# Patient Record
Sex: Female | Born: 2000 | Race: White | Hispanic: No | Marital: Single | Smoking: Current some day smoker
Health system: Southern US, Community
[De-identification: ages and names within clinical notes are randomized; demographics above are authoritative.]

## PROBLEM LIST (undated history)

## (undated) DIAGNOSIS — F1910 Other psychoactive substance abuse, uncomplicated: Secondary | ICD-10-CM

## (undated) DIAGNOSIS — F17210 Nicotine dependence, cigarettes, uncomplicated: Secondary | ICD-10-CM

## (undated) DIAGNOSIS — M5382 Other specified dorsopathies, cervical region: Secondary | ICD-10-CM

## (undated) DIAGNOSIS — S82142A Displaced bicondylar fracture of left tibia, initial encounter for closed fracture: Secondary | ICD-10-CM

## (undated) DIAGNOSIS — F909 Attention-deficit hyperactivity disorder, unspecified type: Secondary | ICD-10-CM

## (undated) DIAGNOSIS — F1990 Other psychoactive substance use, unspecified, uncomplicated: Secondary | ICD-10-CM

## (undated) HISTORY — DX: Other psychoactive substance use, unspecified, uncomplicated: F19.90

## (undated) HISTORY — DX: Displaced bicondylar fracture of left tibia, initial encounter for closed fracture: S82.142A

## (undated) HISTORY — PX: OTHER SURGICAL HISTORY: SHX169

---
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
## 2018-01-06 ENCOUNTER — Ambulatory Visit (INDEPENDENT_AMBULATORY_CARE_PROVIDER_SITE_OTHER): Payer: Medicaid Other | Admitting: Podiatry

## 2018-01-06 ENCOUNTER — Ambulatory Visit (INDEPENDENT_AMBULATORY_CARE_PROVIDER_SITE_OTHER): Payer: Medicaid Other

## 2018-01-06 ENCOUNTER — Encounter: Payer: Self-pay | Admitting: Podiatry

## 2018-01-06 VITALS — BP 136/82 | HR 54 | Ht 65.0 in | Wt 125.0 lb

## 2018-01-06 DIAGNOSIS — M21621 Bunionette of right foot: Secondary | ICD-10-CM

## 2018-01-06 DIAGNOSIS — M205X1 Other deformities of toe(s) (acquired), right foot: Secondary | ICD-10-CM

## 2018-01-06 DIAGNOSIS — M79671 Pain in right foot: Secondary | ICD-10-CM

## 2018-01-06 NOTE — Patient Instructions (Signed)
Pre-Operative Instructions  Congratulations, you have decided to take an important step towards improving your quality of life.  You can be assured that the doctors and staff at [facility] will be with you every step of the way.  Here are some important things you should know:  1. Plan to be at the surgery center/hospital at least 1 (one) hour prior to your scheduled time, unless otherwise directed by the surgical center/hospital staff.  You must have a responsible adult accompany you, remain during the surgery and drive you home.  Make sure you have directions to the surgical center/hospital to ensure you arrive on time. 2. If you are having surgery at [facility] or [facility], you will need a copy of your medical history and physical form from your family physician within one month prior to the date of surgery. We will give you a form for your primary physician to complete.  3. We make every effort to accommodate the date you request for surgery.  However, there are times where surgery dates or times have to be moved.  We will contact you as soon as possible if a change in schedule is required.   4. No aspirin/ibuprofen for one week before surgery.  If you are on aspirin, any non-steroidal anti-inflammatory medications (Mobic, Aleve, Ibuprofen) should not be taken seven (7) days prior to your surgery.  You make take Tylenol for pain prior to surgery.  5. Medications - If you are taking daily heart and blood pressure medications, seizure, reflux, allergy, asthma, anxiety, pain or diabetes medications, make sure you notify the surgery center/hospital before the day of surgery so they can tell you which medications you should take or avoid the day of surgery. 6. No food or drink after midnight the night before surgery unless directed otherwise by surgical center/hospital staff. 7. No alcoholic beverages 24-hours prior to surgery.  No smoking 24-hours prior or 24-hours after  surgery. 8. Wear loose pants or shorts. They should be loose enough to fit over bandages, boots, and casts. 9. Don't wear slip-on shoes. Sneakers are preferred. 10. Bring your boot with you to the surgery center/hospital.  Also bring crutches or a walker if your physician has prescribed it for you.  If you do not have this equipment, it will be provided for you after surgery. 11. If you have not been contacted by the surgery center/hospital by the day before your surgery, call to confirm the date and time of your surgery. 12. Leave-time from work may vary depending on the type of surgery you have.  Appropriate arrangements should be made prior to surgery with your employer. 13. Prescriptions will be provided immediately following surgery by your doctor.  Fill these as soon as possible after surgery and take the medication as directed. Pain medications will not be refilled on weekends and must be approved by the doctor. 14. Remove nail polish on the operative foot and avoid getting pedicures prior to surgery. 15. Wash the night before surgery.  The night before surgery wash the foot and leg well with water and the antibacterial soap provided. Be sure to pay special attention to beneath the toenails and in between the toes.  Wash for at least three (3) minutes. Rinse thoroughly with water and dry well with a towel.  Perform this wash unless told not to do so by your physician.  Enclosed: 1 Ice pack (please put in freezer the night before surgery)   1 Hibiclens skin cleaner     Pre-op instructions  If you have any questions regarding the instructions, please do not hesitate to call our office.  [place]: [address], [place], [place] [address] -- [phone]  [place]: [address] [place], [place] [address] -- [phone]  [place]: [address]  [place], [place] [address] -- [phone]  [place]: [address], [address], [place], [place] [address] -- [phone]  Website:  [email or web address] 

## 2018-01-07 ENCOUNTER — Telehealth: Payer: Self-pay | Admitting: *Deleted

## 2018-01-07 NOTE — Telephone Encounter (Signed)
"  I was just calling to schedule my surgery appointment, thank you."  "I want to schedule my surgery."  Who is your doctor?  "Let me call you back after I get all the information I need."  Is Dr. Samuella CotaPrice your doctor?  "Yes, he is."  Do you have a date that you would like to do it on?  "I'd like to do it on February 13."  He can do it on that date.  "What time will I need to be there?"  You will have to be there late morning or early afternoon.  Someone from the surgical center will call you and give you your surgery time a day or two prior to your surgery date.

## 2018-01-07 NOTE — Telephone Encounter (Addendum)
"  I'm calling to schedule my grand daughter's surgery."  Are you on her HIPAA list?  "I may not be she just turned 17 years old last month."  I will have to speak to she or her mother.  "Okay, I'll get her to give you a call, she has a cell phone."  "My grandma said I needed to call you and see about getting her name added on to my health records.  How do I go about doing that?"  You will have to come by the form to fill out a form.  "Okay, thank you."

## 2018-01-07 NOTE — Telephone Encounter (Signed)
Thanks

## 2018-01-07 NOTE — Telephone Encounter (Signed)
Dr. Samuella CotaPrice does not have anything available for February 13.  I apologize, I thought he had time in the afternoon available on that date.  He can do it on February 20.  "Okay, that date is fine."  I'll change the date from 01/15/2018 to 01/22/2018.

## 2018-01-07 NOTE — Telephone Encounter (Signed)
I'm not operating on the 13th - I told her the earliest I could do is the 20th

## 2018-01-09 ENCOUNTER — Telehealth: Payer: Self-pay | Admitting: *Deleted

## 2018-01-09 NOTE — Telephone Encounter (Signed)
Entered in error
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
## 2018-01-28 ENCOUNTER — Encounter: Payer: Medicaid Other | Admitting: Podiatry

## 2018-01-29 ENCOUNTER — Encounter: Payer: Self-pay | Admitting: Podiatry

## 2018-01-29 ENCOUNTER — Other Ambulatory Visit: Payer: Self-pay | Admitting: Podiatry

## 2018-01-29 DIAGNOSIS — M2041 Other hammer toe(s) (acquired), right foot: Secondary | ICD-10-CM | POA: Diagnosis not present

## 2018-01-29 DIAGNOSIS — M2011 Hallux valgus (acquired), right foot: Secondary | ICD-10-CM | POA: Diagnosis not present

## 2018-01-29 MED ORDER — ONDANSETRON HCL 4 MG PO TABS: 4.0000 mg | ORAL_TABLET | Freq: Three times a day (TID) | ORAL | 0 refills | Status: DC | PRN

## 2018-01-29 MED ORDER — OXYCODONE-ACETAMINOPHEN 10-325 MG PO TABS: 1.0000 | ORAL_TABLET | ORAL | 0 refills | Status: DC | PRN

## 2018-01-29 MED ORDER — CEPHALEXIN 500 MG PO CAPS: 500.0000 mg | ORAL_CAPSULE | Freq: Two times a day (BID) | ORAL | 0 refills | Status: DC

## 2018-01-29 NOTE — Progress Notes (Signed)
Patient presents today for planned surgery R foot. Rxes sent to pharmacy

## 2018-01-30 ENCOUNTER — Other Ambulatory Visit: Payer: Self-pay | Admitting: Podiatry

## 2018-01-30 DIAGNOSIS — M21621 Bunionette of right foot: Secondary | ICD-10-CM

## 2018-01-30 DIAGNOSIS — M79671 Pain in right foot: Secondary | ICD-10-CM

## 2018-01-30 DIAGNOSIS — M205X1 Other deformities of toe(s) (acquired), right foot: Secondary | ICD-10-CM

## 2018-01-31 ENCOUNTER — Telehealth: Payer: Self-pay | Admitting: *Deleted

## 2018-01-31 NOTE — Telephone Encounter (Signed)
Called and left a message for the patient to call me back-I was calling to check on patient to see how she was doing after the surgery on Wednesday with Dr Samuella CotaPrice and to call the [place] office [phone]. Candice StanleyLisa

## 2018-02-04 ENCOUNTER — Ambulatory Visit (INDEPENDENT_AMBULATORY_CARE_PROVIDER_SITE_OTHER): Payer: Medicaid Other

## 2018-02-04 ENCOUNTER — Ambulatory Visit (INDEPENDENT_AMBULATORY_CARE_PROVIDER_SITE_OTHER): Payer: Self-pay | Admitting: Podiatry

## 2018-02-04 DIAGNOSIS — Z9889 Other specified postprocedural states: Secondary | ICD-10-CM

## 2018-02-04 DIAGNOSIS — M205X1 Other deformities of toe(s) (acquired), right foot: Secondary | ICD-10-CM

## 2018-02-04 DIAGNOSIS — M216X1 Other acquired deformities of right foot: Secondary | ICD-10-CM | POA: Diagnosis not present

## 2018-02-04 DIAGNOSIS — M21621 Bunionette of right foot: Secondary | ICD-10-CM

## 2018-02-04 MED ORDER — OXYCODONE-ACETAMINOPHEN 5-325 MG PO TABS: 1.0000 | ORAL_TABLET | ORAL | 0 refills | Status: DC | PRN

## 2018-02-04 NOTE — Progress Notes (Signed)
  Subjective:  Patient ID: Candice NorfolkKarmen Jordan, female    DOB: May 16, 2001,  MRN: [id number]  Chief Complaint  Patient presents with  . Routine Post Op    pov #1 dos 02.20.2019 Tailors Bunionectomy Rt; [place]iz-[place]ocks [place]    DOS: 01/22/18 Procedure: R Tailor's Bunion, Correction R 5th Cock-up Toe / Ruiz-Mora procedure.  17 y.o. female returns for post-op check. Denies N/V/F/Ch. Pain is controlled with current medications. Had to purchase crutches because she could not put weight on it the day after surgery. Otherwise pain now improved. Only has achy pain at night. Patient pleased with appearance of her toe.  Objective:   General AA&O x3. Normal mood and affect.  Vascular Foot warm and well perfused.  Neurologic Gross sensation intact.  Dermatologic Skin healing well without signs of infection. Skin edges well coapted without signs of infection.  Orthopedic: Tenderness to palpation noted about the surgical site. 5th toe rectus, neutral without angular deformity.    Assessment & Plan:  Patient was evaluated and treated and all questions answered.  S/p Correction R Tailor's Bunion, Correction R 5th Cock-up Toe / Ruiz-Mora procedure. -XR taken and reviewed. Consistent with post-op state. -Progressing as expected post-operatively. -Sutures: intact. -Medications refilled: Percocet; reduced to 5/325. -Foot redressed.  No Follow-up on file.

## 2018-02-11 ENCOUNTER — Encounter: Payer: Medicaid Other | Admitting: Podiatry

## 2018-02-17 ENCOUNTER — Ambulatory Visit (INDEPENDENT_AMBULATORY_CARE_PROVIDER_SITE_OTHER): Payer: Self-pay | Admitting: Podiatry

## 2018-02-17 DIAGNOSIS — Z9889 Other specified postprocedural states: Secondary | ICD-10-CM

## 2018-02-17 DIAGNOSIS — M21621 Bunionette of right foot: Secondary | ICD-10-CM

## 2018-02-17 NOTE — Progress Notes (Signed)
  Subjective:  Patient ID: Candice Jordan, female    DOB: November 19, 2001,  MRN: [id number]  Chief Complaint  Patient presents with  . Routine Post Op    pov#2 dos 02.20.2019 Tailors Bunionectomy Rt; Ruiz-Mora Cocks in Toe 5th Rt PT. stated," no pain at all today, it's been doing okay." Tx: none    DOS: 01/22/18 Procedure: R Tailor's Bunion, Correction R 5th Cock-up Toe / Ruiz-Mora procedure.  17 y.o. female returns for post-op check. Denies pain. Has been compliant with surgical shoe. Denies post-op issues.  Objective:   General AA&O x3. Normal mood and affect.  Vascular Foot warm and well perfused.  Neurologic Gross sensation intact.  Dermatologic Skin well healed with intact suture.  Orthopedic: No tenderness to palpation noted about the surgical site. 5th toe rectus, neutral without angular deformity.   Assessment & Plan:  Patient was evaluated and treated and all questions answered.  S/p Correction R Tailor's Bunion, Correction R 5th Cock-up Toe / Ruiz-Mora procedure. -Suture ends cut. Steris applied. Ok to shower but not soak. -Refer to PT for ROM 5th MPJ, strengthening. -Transition out of surgical shoe in 1 week.  Return in about 2 weeks (around 03/03/2018) for Post-op. Will obtain XR at that time.

## 2018-02-17 NOTE — Addendum Note (Signed)
Addended by: Renaldo ReelPARRY, Holly Pring A on: 02/17/2018 02:12 PM   Modules accepted: Orders

## 2018-02-27 ENCOUNTER — Telehealth: Payer: Self-pay | Admitting: Podiatry

## 2018-02-27 NOTE — Telephone Encounter (Signed)
This is Designer, fashion/clothingtephanie at [facility]. I'm calling about Ms. Candice SchanzDorsey that we are going to be seeing tomorrow. I was calling to see if we can get notes on her last office visit and if you happen to have her referral. If you could please fax those to us at [phone] and if you need to call me, please call me at [phone]. Thank you.

## 2018-03-03 ENCOUNTER — Encounter: Payer: Self-pay | Admitting: Podiatry

## 2018-03-04 NOTE — Progress Notes (Signed)
Erroneous - patient no showed appointment.

## 2018-03-17 ENCOUNTER — Encounter: Payer: Medicaid Other | Admitting: Podiatry

## 2018-03-31 NOTE — Progress Notes (Signed)
Erroneous

## 2020-03-09 DIAGNOSIS — R8271 Bacteriuria: Secondary | ICD-10-CM

## 2020-03-09 DIAGNOSIS — R7881 Bacteremia: Secondary | ICD-10-CM | POA: Diagnosis not present

## 2020-03-09 DIAGNOSIS — I76 Septic arterial embolism: Secondary | ICD-10-CM

## 2020-03-09 DIAGNOSIS — R509 Fever, unspecified: Secondary | ICD-10-CM

## 2020-03-09 DIAGNOSIS — R5381 Other malaise: Secondary | ICD-10-CM

## 2020-03-09 DIAGNOSIS — F1910 Other psychoactive substance abuse, uncomplicated: Secondary | ICD-10-CM

## 2020-03-09 DIAGNOSIS — U071 COVID-19: Secondary | ICD-10-CM

## 2020-03-10 DIAGNOSIS — R509 Fever, unspecified: Secondary | ICD-10-CM | POA: Diagnosis not present

## 2020-03-10 DIAGNOSIS — F1910 Other psychoactive substance abuse, uncomplicated: Secondary | ICD-10-CM | POA: Diagnosis not present

## 2020-03-10 DIAGNOSIS — I76 Septic arterial embolism: Secondary | ICD-10-CM | POA: Diagnosis not present

## 2020-03-10 DIAGNOSIS — U071 COVID-19: Secondary | ICD-10-CM | POA: Diagnosis not present

## 2020-03-10 MED ORDER — EQUATE NICOTINE 4 MG MT GUM
4.00 | CHEWING_GUM | OROMUCOSAL | Status: DC
Start: ? — End: 2020-03-10

## 2020-03-10 MED ORDER — HYDROXYZINE HCL 25 MG PO TABS
25.00 | ORAL_TABLET | ORAL | Status: DC
Start: ? — End: 2020-03-10

## 2020-03-10 MED ORDER — Medication
750.00 | Status: DC
Start: 2020-03-10 — End: 2020-03-10

## 2020-03-10 MED ORDER — Medication
1.00 | Status: DC
Start: 2020-03-11 — End: 2020-03-10

## 2020-03-19 ENCOUNTER — Inpatient Hospital Stay (HOSPITAL_COMMUNITY)
Admission: EM | Admit: 2020-03-19 | Discharge: 2020-03-21 | DRG: 177 | Discharge status: Against Medical Advice | Payer: Medicaid Other | Attending: Internal Medicine | Admitting: Internal Medicine

## 2020-03-19 ENCOUNTER — Encounter (HOSPITAL_COMMUNITY): Payer: Self-pay | Admitting: Emergency Medicine

## 2020-03-19 ENCOUNTER — Emergency Department (HOSPITAL_COMMUNITY): Payer: Medicaid Other

## 2020-03-19 DIAGNOSIS — B9562 Methicillin resistant Staphylococcus aureus infection as the cause of diseases classified elsewhere: Secondary | ICD-10-CM | POA: Diagnosis present

## 2020-03-19 DIAGNOSIS — Z825 Family history of asthma and other chronic lower respiratory diseases: Secondary | ICD-10-CM

## 2020-03-19 DIAGNOSIS — E872 Acidosis, unspecified: Secondary | ICD-10-CM | POA: Diagnosis present

## 2020-03-19 DIAGNOSIS — E876 Hypokalemia: Secondary | ICD-10-CM | POA: Diagnosis present

## 2020-03-19 DIAGNOSIS — I76 Septic arterial embolism: Secondary | ICD-10-CM | POA: Diagnosis present

## 2020-03-19 DIAGNOSIS — Z87898 Personal history of other specified conditions: Secondary | ICD-10-CM

## 2020-03-19 DIAGNOSIS — J15212 Pneumonia due to Methicillin resistant Staphylococcus aureus: Principal | ICD-10-CM | POA: Diagnosis present

## 2020-03-19 DIAGNOSIS — F1910 Other psychoactive substance abuse, uncomplicated: Secondary | ICD-10-CM | POA: Diagnosis present

## 2020-03-19 DIAGNOSIS — Z79899 Other long term (current) drug therapy: Secondary | ICD-10-CM

## 2020-03-19 DIAGNOSIS — R7989 Other specified abnormal findings of blood chemistry: Secondary | ICD-10-CM

## 2020-03-19 DIAGNOSIS — E860 Dehydration: Secondary | ICD-10-CM | POA: Diagnosis present

## 2020-03-19 DIAGNOSIS — F1123 Opioid dependence with withdrawal: Secondary | ICD-10-CM | POA: Diagnosis present

## 2020-03-19 DIAGNOSIS — I38 Endocarditis, valve unspecified: Secondary | ICD-10-CM | POA: Diagnosis present

## 2020-03-19 DIAGNOSIS — F17210 Nicotine dependence, cigarettes, uncomplicated: Secondary | ICD-10-CM | POA: Diagnosis present

## 2020-03-19 DIAGNOSIS — U071 COVID-19: Secondary | ICD-10-CM | POA: Diagnosis present

## 2020-03-19 DIAGNOSIS — Z5329 Procedure and treatment not carried out because of patient's decision for other reasons: Secondary | ICD-10-CM | POA: Diagnosis not present

## 2020-03-19 DIAGNOSIS — E871 Hypo-osmolality and hyponatremia: Secondary | ICD-10-CM | POA: Diagnosis present

## 2020-03-19 DIAGNOSIS — I2690 Septic pulmonary embolism without acute cor pulmonale: Secondary | ICD-10-CM | POA: Diagnosis present

## 2020-03-19 DIAGNOSIS — R7881 Bacteremia: Secondary | ICD-10-CM | POA: Diagnosis present

## 2020-03-19 HISTORY — DX: Other psychoactive substance abuse, uncomplicated: F19.10

## 2020-03-19 HISTORY — DX: Nicotine dependence, cigarettes, uncomplicated: F17.210

## 2020-03-19 LAB — COMPREHENSIVE METABOLIC PANEL
ALT: 93 U/L — ABNORMAL HIGH (ref 0–44)
AST: 64 U/L — ABNORMAL HIGH (ref 15–41)
Albumin: 2 g/dL — ABNORMAL LOW (ref 3.5–5.0)
Alkaline Phosphatase: 138 U/L — ABNORMAL HIGH (ref 38–126)
Anion gap: 11 (ref 5–15)
BUN: 14 mg/dL (ref 6–20)
CO2: 22 mmol/L (ref 22–32)
Calcium: 7.6 mg/dL — ABNORMAL LOW (ref 8.9–10.3)
Chloride: 95 mmol/L — ABNORMAL LOW (ref 98–111)
Creatinine, Ser: 0.83 mg/dL (ref 0.44–1.00)
GFR calc Af Amer: >60 mL/min (ref >60)
GFR calc non Af Amer: >60 mL/min (ref >60)
Glucose, Bld: 127 mg/dL — ABNORMAL HIGH (ref 70–99)
Potassium: 3.4 mmol/L — ABNORMAL LOW (ref 3.5–5.1)
Sodium: 128 mmol/L — ABNORMAL LOW (ref 135–145)
Total Bilirubin: 0.7 mg/dL (ref 0.3–1.2)
Total Protein: 7.2 g/dL (ref 6.5–8.1)

## 2020-03-19 LAB — URINALYSIS, ROUTINE W REFLEX MICROSCOPIC
Bacteria, UA: NONE SEEN
Bilirubin Urine: NEGATIVE
Glucose, UA: NEGATIVE mg/dL
Ketones, ur: NEGATIVE mg/dL
Nitrite: NEGATIVE
Protein, ur: 30 mg/dL — AB
Specific Gravity, Urine: 1.014 (ref 1.005–1.030)
pH: 6 (ref 5.0–8.0)

## 2020-03-19 LAB — CBC WITH DIFFERENTIAL/PLATELET
Abs Immature Granulocytes: 0.15 10*3/uL — ABNORMAL HIGH (ref 0.00–0.07)
Basophils Absolute: 0 10*3/uL (ref 0.0–0.1)
Basophils Relative: 0 %
Eosinophils Absolute: 0 10*3/uL (ref 0.0–0.5)
Eosinophils Relative: 0 %
HCT: 28.2 % — ABNORMAL LOW (ref 36.0–46.0)
Hemoglobin: 9.4 g/dL — ABNORMAL LOW (ref 12.0–15.0)
Immature Granulocytes: 1 %
Lymphocytes Relative: 14 %
Lymphs Abs: 1.8 10*3/uL (ref 0.7–4.0)
MCH: 29.7 pg (ref 26.0–34.0)
MCHC: 33.3 g/dL (ref 30.0–36.0)
MCV: 89.2 fL (ref 80.0–100.0)
Monocytes Absolute: 0.9 10*3/uL (ref 0.1–1.0)
Monocytes Relative: 7 %
Neutro Abs: 10 10*3/uL — ABNORMAL HIGH (ref 1.7–7.7)
Neutrophils Relative %: 78 %
Platelets: 383 10*3/uL (ref 150–400)
RBC: 3.16 MIL/uL — ABNORMAL LOW (ref 3.87–5.11)
RDW: 12.8 % (ref 11.5–15.5)
WBC: 13 10*3/uL — ABNORMAL HIGH (ref 4.0–10.5)
nRBC: 0 % (ref 0.0–0.2)

## 2020-03-19 LAB — I-STAT BETA HCG BLOOD, ED (MC, WL, AP ONLY): I-stat hCG, quantitative: <5 m[IU]/mL (ref <5)

## 2020-03-19 MED ORDER — SODIUM CHLORIDE 0.9% FLUSH
3.0000 mL | Freq: Once | INTRAVENOUS | Status: AC
  Administered 2020-03-20: 3 mL via INTRAVENOUS

## 2020-03-19 MED ORDER — SODIUM CHLORIDE 0.9 % IV BOLUS
1000.0000 mL | Freq: Once | INTRAVENOUS | Status: AC
  Administered 2020-03-20: 1000 mL via INTRAVENOUS

## 2020-03-19 NOTE — ED Triage Notes (Signed)
Pt is not forthcoming w/ information.  She only related that she is positive for COVID and that she was released from [facility] for COVID.  "I'm dying"

## 2020-03-20 ENCOUNTER — Encounter (HOSPITAL_COMMUNITY): Payer: Self-pay | Admitting: Internal Medicine

## 2020-03-20 ENCOUNTER — Inpatient Hospital Stay (HOSPITAL_COMMUNITY): Payer: Medicaid Other

## 2020-03-20 ENCOUNTER — Other Ambulatory Visit: Payer: Self-pay

## 2020-03-20 DIAGNOSIS — F17210 Nicotine dependence, cigarettes, uncomplicated: Secondary | ICD-10-CM | POA: Diagnosis present

## 2020-03-20 DIAGNOSIS — I38 Endocarditis, valve unspecified: Secondary | ICD-10-CM | POA: Diagnosis present

## 2020-03-20 DIAGNOSIS — B9562 Methicillin resistant Staphylococcus aureus infection as the cause of diseases classified elsewhere: Secondary | ICD-10-CM | POA: Diagnosis not present

## 2020-03-20 DIAGNOSIS — I330 Acute and subacute infective endocarditis: Secondary | ICD-10-CM

## 2020-03-20 DIAGNOSIS — U071 COVID-19: Secondary | ICD-10-CM | POA: Diagnosis not present

## 2020-03-20 DIAGNOSIS — I340 Nonrheumatic mitral (valve) insufficiency: Secondary | ICD-10-CM | POA: Diagnosis not present

## 2020-03-20 DIAGNOSIS — J15212 Pneumonia due to Methicillin resistant Staphylococcus aureus: Secondary | ICD-10-CM | POA: Diagnosis present

## 2020-03-20 DIAGNOSIS — R7989 Other specified abnormal findings of blood chemistry: Secondary | ICD-10-CM

## 2020-03-20 DIAGNOSIS — E876 Hypokalemia: Secondary | ICD-10-CM | POA: Diagnosis present

## 2020-03-20 DIAGNOSIS — I2690 Septic pulmonary embolism without acute cor pulmonale: Secondary | ICD-10-CM | POA: Diagnosis present

## 2020-03-20 DIAGNOSIS — I76 Septic arterial embolism: Secondary | ICD-10-CM | POA: Diagnosis present

## 2020-03-20 DIAGNOSIS — Z79899 Other long term (current) drug therapy: Secondary | ICD-10-CM | POA: Diagnosis not present

## 2020-03-20 DIAGNOSIS — Z5329 Procedure and treatment not carried out because of patient's decision for other reasons: Secondary | ICD-10-CM | POA: Diagnosis not present

## 2020-03-20 DIAGNOSIS — Z825 Family history of asthma and other chronic lower respiratory diseases: Secondary | ICD-10-CM | POA: Diagnosis not present

## 2020-03-20 DIAGNOSIS — E860 Dehydration: Secondary | ICD-10-CM

## 2020-03-20 DIAGNOSIS — E871 Hypo-osmolality and hyponatremia: Secondary | ICD-10-CM | POA: Diagnosis present

## 2020-03-20 DIAGNOSIS — F1123 Opioid dependence with withdrawal: Secondary | ICD-10-CM | POA: Diagnosis present

## 2020-03-20 DIAGNOSIS — F17200 Nicotine dependence, unspecified, uncomplicated: Secondary | ICD-10-CM

## 2020-03-20 DIAGNOSIS — F19939 Other psychoactive substance use, unspecified with withdrawal, unspecified: Secondary | ICD-10-CM

## 2020-03-20 DIAGNOSIS — I361 Nonrheumatic tricuspid (valve) insufficiency: Secondary | ICD-10-CM | POA: Diagnosis not present

## 2020-03-20 DIAGNOSIS — R7881 Bacteremia: Secondary | ICD-10-CM | POA: Diagnosis present

## 2020-03-20 DIAGNOSIS — Z8616 Personal history of COVID-19: Secondary | ICD-10-CM

## 2020-03-20 DIAGNOSIS — A4902 Methicillin resistant Staphylococcus aureus infection, unspecified site: Secondary | ICD-10-CM | POA: Diagnosis present

## 2020-03-20 DIAGNOSIS — E872 Acidosis, unspecified: Secondary | ICD-10-CM | POA: Diagnosis present

## 2020-03-20 DIAGNOSIS — F1910 Other psychoactive substance abuse, uncomplicated: Secondary | ICD-10-CM | POA: Diagnosis present

## 2020-03-20 LAB — HIV ANTIBODY (ROUTINE TESTING W REFLEX): HIV Screen 4th Generation wRfx: NONREACTIVE

## 2020-03-20 LAB — COMPREHENSIVE METABOLIC PANEL
ALT: 71 U/L — ABNORMAL HIGH (ref 0–44)
AST: 44 U/L — ABNORMAL HIGH (ref 15–41)
Albumin: 1.5 g/dL — ABNORMAL LOW (ref 3.5–5.0)
Alkaline Phosphatase: 119 U/L (ref 38–126)
Anion gap: 8 (ref 5–15)
BUN: 6 mg/dL (ref 6–20)
CO2: 24 mmol/L (ref 22–32)
Calcium: 7.4 mg/dL — ABNORMAL LOW (ref 8.9–10.3)
Chloride: 100 mmol/L (ref 98–111)
Creatinine, Ser: 0.71 mg/dL (ref 0.44–1.00)
GFR calc Af Amer: >60 mL/min (ref >60)
GFR calc non Af Amer: >60 mL/min (ref >60)
Glucose, Bld: 118 mg/dL — ABNORMAL HIGH (ref 70–99)
Potassium: 3.1 mmol/L — ABNORMAL LOW (ref 3.5–5.1)
Sodium: 132 mmol/L — ABNORMAL LOW (ref 135–145)
Total Bilirubin: 0.6 mg/dL (ref 0.3–1.2)
Total Protein: 5.8 g/dL — ABNORMAL LOW (ref 6.5–8.1)

## 2020-03-20 LAB — BLOOD CULTURE ID PANEL (REFLEXED)

## 2020-03-20 LAB — C-REACTIVE PROTEIN: CRP: 16.7 mg/dL — ABNORMAL HIGH (ref <1.0)

## 2020-03-20 LAB — TROPONIN I (HIGH SENSITIVITY): Troponin I (High Sensitivity): 15 ng/L (ref <18)

## 2020-03-20 LAB — LACTATE DEHYDROGENASE: LDH: 224 U/L — ABNORMAL HIGH (ref 98–192)

## 2020-03-20 LAB — LACTIC ACID, PLASMA
Lactic Acid, Venous: 2.3 mmol/L (ref 0.5–1.9)
Lactic Acid, Venous: 1.4 mmol/L (ref 0.5–1.9)

## 2020-03-20 LAB — D-DIMER, QUANTITATIVE: D-Dimer, Quant: 5.91 ug/mL-FEU — ABNORMAL HIGH (ref 0.00–0.50)

## 2020-03-20 MED ORDER — ACETAMINOPHEN 325 MG PO TABS
650.0000 mg | ORAL_TABLET | Freq: Four times a day (QID) | ORAL | Status: DC | PRN
  Administered 2020-03-20 – 2020-03-21 (×3): 650 mg via ORAL
  Filled 2020-03-20 (×4): qty 2

## 2020-03-20 MED ORDER — BUPRENORPHINE HCL-NALOXONE HCL 8-2 MG SL SUBL
1.0000 | SUBLINGUAL_TABLET | Freq: Every day | SUBLINGUAL | Status: DC
  Administered 2020-03-21: 1 via SUBLINGUAL
  Filled 2020-03-20: qty 1

## 2020-03-20 MED ORDER — POLYETHYLENE GLYCOL 3350 17 G PO PACK: 17.0000 g | PACK | Freq: Every day | ORAL | Status: DC | PRN

## 2020-03-20 MED ORDER — VANCOMYCIN HCL 750 MG/150ML IV SOLN
750.0000 mg | Freq: Two times a day (BID) | INTRAVENOUS | Status: DC
  Administered 2020-03-20: 750 mg via INTRAVENOUS
  Filled 2020-03-20: qty 150

## 2020-03-20 MED ORDER — ONDANSETRON HCL 4 MG/2ML IJ SOLN: 4.0000 mg | Freq: Four times a day (QID) | INTRAMUSCULAR | Status: DC | PRN

## 2020-03-20 MED ORDER — HYDROXYZINE HCL 25 MG PO TABS
25.0000 mg | ORAL_TABLET | Freq: Four times a day (QID) | ORAL | Status: DC | PRN
  Administered 2020-03-20 – 2020-03-21 (×4): 25 mg via ORAL
  Filled 2020-03-20 (×5): qty 1

## 2020-03-20 MED ORDER — PIPERACILLIN-TAZOBACTAM 3.375 G IVPB 30 MIN
3.3750 g | Freq: Once | INTRAVENOUS | Status: AC
  Administered 2020-03-20: 3.375 g via INTRAVENOUS
  Filled 2020-03-20: qty 50

## 2020-03-20 MED ORDER — LOPERAMIDE HCL 2 MG PO CAPS: 2.0000 mg | ORAL_CAPSULE | ORAL | Status: DC | PRN

## 2020-03-20 MED ORDER — VANCOMYCIN HCL IN DEXTROSE 1-5 GM/200ML-% IV SOLN
1000.0000 mg | Freq: Once | INTRAVENOUS | Status: AC
  Administered 2020-03-20: 1000 mg via INTRAVENOUS
  Filled 2020-03-20: qty 200

## 2020-03-20 MED ORDER — NICOTINE 21 MG/24HR TD PT24
21.0000 mg | MEDICATED_PATCH | Freq: Every day | TRANSDERMAL | Status: DC
  Administered 2020-03-20: 21 mg via TRANSDERMAL
  Filled 2020-03-20: qty 1

## 2020-03-20 MED ORDER — OXYCODONE-ACETAMINOPHEN 5-325 MG PO TABS
2.0000 | ORAL_TABLET | ORAL | Status: DC | PRN
  Filled 2020-03-20: qty 2

## 2020-03-20 MED ORDER — LACTATED RINGERS IV BOLUS
1000.0000 mL | Freq: Once | INTRAVENOUS | Status: AC
  Administered 2020-03-20: 1000 mL via INTRAVENOUS

## 2020-03-20 MED ORDER — ONDANSETRON HCL 4 MG PO TABS: 4.0000 mg | ORAL_TABLET | Freq: Four times a day (QID) | ORAL | Status: DC | PRN

## 2020-03-20 MED ORDER — ONDANSETRON 4 MG PO TBDP: 4.0000 mg | ORAL_TABLET | Freq: Four times a day (QID) | ORAL | Status: DC | PRN

## 2020-03-20 MED ORDER — CLONIDINE HCL 0.1 MG PO TABS: 0.1000 mg | ORAL_TABLET | ORAL | Status: DC

## 2020-03-20 MED ORDER — ORITAVANCIN DIPHOSPHATE 400 MG IV SOLR
1200.0000 mg | Freq: Once | INTRAVENOUS | Status: AC
  Administered 2020-03-20: 1200 mg via INTRAVENOUS
  Filled 2020-03-20: qty 120

## 2020-03-20 MED ORDER — CLONIDINE HCL 0.1 MG PO TABS: 0.1000 mg | ORAL_TABLET | Freq: Every day | ORAL | Status: DC

## 2020-03-20 MED ORDER — LACTATED RINGERS IV SOLN: INTRAVENOUS | Status: DC

## 2020-03-20 MED ORDER — NAPROXEN 250 MG PO TABS
500.0000 mg | ORAL_TABLET | Freq: Two times a day (BID) | ORAL | Status: DC | PRN
  Filled 2020-03-20: qty 2

## 2020-03-20 MED ORDER — ENOXAPARIN SODIUM 40 MG/0.4ML ~~LOC~~ SOLN
40.0000 mg | Freq: Every day | SUBCUTANEOUS | Status: DC
  Administered 2020-03-20: 40 mg via SUBCUTANEOUS
  Filled 2020-03-20: qty 0.4

## 2020-03-20 MED ORDER — METHOCARBAMOL 500 MG PO TABS
500.0000 mg | ORAL_TABLET | Freq: Three times a day (TID) | ORAL | Status: DC | PRN
  Administered 2020-03-20 – 2020-03-21 (×4): 500 mg via ORAL
  Filled 2020-03-20 (×4): qty 1

## 2020-03-20 MED ORDER — CLONAZEPAM 0.5 MG PO TABS
0.5000 mg | ORAL_TABLET | Freq: Two times a day (BID) | ORAL | Status: DC | PRN
  Administered 2020-03-20 – 2020-03-21 (×3): 0.5 mg via ORAL
  Filled 2020-03-20 (×3): qty 1

## 2020-03-20 MED ORDER — ACETAMINOPHEN 650 MG RE SUPP
650.0000 mg | Freq: Four times a day (QID) | RECTAL | Status: DC | PRN
  Administered 2020-03-21: 650 mg via RECTAL

## 2020-03-20 MED ORDER — HYDROMORPHONE HCL 1 MG/ML IJ SOLN
1.0000 mg | INTRAMUSCULAR | Status: DC | PRN
  Administered 2020-03-20 (×2): 1 mg via INTRAVENOUS
  Filled 2020-03-20 (×2): qty 1

## 2020-03-20 MED ORDER — DICYCLOMINE HCL 20 MG PO TABS
20.0000 mg | ORAL_TABLET | Freq: Four times a day (QID) | ORAL | Status: DC | PRN
  Filled 2020-03-20 (×3): qty 1

## 2020-03-20 MED ORDER — POTASSIUM CHLORIDE CRYS ER 20 MEQ PO TBCR
40.0000 meq | EXTENDED_RELEASE_TABLET | Freq: Once | ORAL | Status: AC
  Administered 2020-03-20: 40 meq via ORAL
  Filled 2020-03-20: qty 2

## 2020-03-20 MED ORDER — BUPRENORPHINE HCL-NALOXONE HCL 2-0.5 MG SL SUBL
2.0000 | SUBLINGUAL_TABLET | Freq: Two times a day (BID) | SUBLINGUAL | Status: DC
  Administered 2020-03-20: 2 via SUBLINGUAL

## 2020-03-20 MED ORDER — LACTATED RINGERS IV SOLN: INTRAVENOUS | Status: AC

## 2020-03-20 MED ORDER — CLONIDINE HCL 0.1 MG PO TABS
0.1000 mg | ORAL_TABLET | Freq: Four times a day (QID) | ORAL | Status: DC
  Administered 2020-03-20 – 2020-03-21 (×5): 0.1 mg via ORAL
  Filled 2020-03-20 (×5): qty 1

## 2020-03-20 MED ORDER — BUPRENORPHINE HCL-NALOXONE HCL 2-0.5 MG SL SUBL
2.0000 | SUBLINGUAL_TABLET | SUBLINGUAL | Status: AC
  Administered 2020-03-20 (×2): 2 via SUBLINGUAL
  Filled 2020-03-20 (×3): qty 2

## 2020-03-20 NOTE — Progress Notes (Signed)
PHARMACY - PHYSICIAN COMMUNICATION CRITICAL VALUE ALERT - BLOOD CULTURE IDENTIFICATION (BCID)  Candice Jordan is an 19 y.o. female who presented to [facility] on 03/19/2020 with a chief complaint of generalized body aches after leaving [facility] AMA on 4/8. At [facility] she was found to be COVID positive and have MRSA bacteremia with septic emboli. She will be added to the ID list and worked up for endocarditis.   Assessment:  4/4 bottles positive for MRSA   Name of physician (or Provider) Contacted: Dr. Jerral Ralph, ID automatically consulted  Current antibiotics: Vancomycin   Changes to prescribed antibiotics recommended:  Recommendations accepted by provider  Continue vancomycin   Results for orders placed or performed during the hospital encounter of 03/19/20  Blood Culture ID Panel (Reflexed) (Collected: 03/19/2020 11:53 PM)  Result Value Ref Range   Enterococcus species NOT DETECTED NOT DETECTED   Listeria monocytogenes NOT DETECTED NOT DETECTED   Staphylococcus species DETECTED (A) NOT DETECTED   Staphylococcus aureus (BCID) DETECTED (A) NOT DETECTED   Methicillin resistance DETECTED (A) NOT DETECTED   Streptococcus species NOT DETECTED NOT DETECTED   Streptococcus agalactiae NOT DETECTED NOT DETECTED   Streptococcus pneumoniae NOT DETECTED NOT DETECTED   Streptococcus pyogenes NOT DETECTED NOT DETECTED   Acinetobacter baumannii NOT DETECTED NOT DETECTED   Enterobacteriaceae species NOT DETECTED NOT DETECTED   Enterobacter cloacae complex NOT DETECTED NOT DETECTED   Escherichia coli NOT DETECTED NOT DETECTED   Klebsiella oxytoca NOT DETECTED NOT DETECTED   Klebsiella pneumoniae NOT DETECTED NOT DETECTED   Proteus species NOT DETECTED NOT DETECTED   Serratia marcescens NOT DETECTED NOT DETECTED   Haemophilus influenzae NOT DETECTED NOT DETECTED   Neisseria meningitidis NOT DETECTED NOT DETECTED   Pseudomonas aeruginosa NOT DETECTED NOT DETECTED   Candida albicans NOT  DETECTED NOT DETECTED   Candida glabrata NOT DETECTED NOT DETECTED   Candida krusei NOT DETECTED NOT DETECTED   Candida parapsilosis NOT DETECTED NOT DETECTED   Candida tropicalis NOT DETECTED NOT DETECTED    Jeannetta Nap 03/20/2020  12:37 PM

## 2020-03-20 NOTE — ED Notes (Signed)
Dr. Wilkie Aye notified of pt Lactic Acid.

## 2020-03-20 NOTE — Progress Notes (Addendum)
PROGRESS NOTE                                                                                                                                                                                                             Patient Demographics:    Candice Jordan, is a 19 y.o. female, DOB - 09-16-2001, ZSW:[id number]  Outpatient Primary MD for the patient is System, Provider Not In   Admit date - 03/19/2020   LOS - 0  Chief Complaint  Patient presents with  . COVID       Brief Narrative: Patient is a 19 y.o. 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 where she was found to have COVID-19 infection and MRSA bacteremia with septic emboli-she unfortunately signed out AMA, subsequently then presented to [facility] ED on 4/8 where she again signed out AMA-presented to the hospital on 4/17 with generalized body aches, weakness, poor appetite and cough-she was subsequently admitted to the hospitalist service.  See below for further details.  Significant Events: 4/17>> admit to [facility] 4/8>> signed out AMA from [facility] emergency room 4/7-4/8>> signed out from [facility]- admit for COVID-19 infection and MRSA bacteremia with presumed septic emboli seen on CT chest. 4/7>> CT angio chest at [facility] for PE, multifocal airspace disease consistent with septic emboli/multifocal pneumonia 4/7>> TTE at [facility]- EF 55-60%, no vegetations  COVID-19 medications: None  Antibiotics: Vancomycin 4/17>>  Microbiology data: Blood culture 4/18: Pending Blood culture 4/8 (at [facility]): MRSA Blood culture 4/7 (at [facility]): MRSA  DVT prophylaxis: SQ Lovenox  Procedures: None  Consults: ID   Subjective:    Atoya Dault today is actively withdrawing from heroin-she is very jittery, having chills.  She tells me she is "detoxing"   Assessment  & Plan :   MRSA bacteremia with septic emboli to the lungs:  Continue IV vancomycin-await repeat blood cultures.  Repeat TTE.  Await input from ID.  COVID-19 infection: Suspect findings and CT chest/chest x-ray more compatible with septic emboli rather than COVID-19 pneumonia.  Inflammatory markers/D-dimer all elevated-likely all consistent with septic emboli.  We will continue to monitor off treatment for now.  Recent Labs    03/19/20 2353  DDIMER 5.91*  LDH 224*  CRP 16.7*    No results found for: SARSCOV2NAA  Elevated D-dimer: Recent CTA chest negative-not hypoxic-check lower extremity Doppler.  Heroin withdrawal: Currently actively withdrawing from heroin-discussed with pharmacy team-we will give 4 mg of Suboxone twice daily today-and assess response.  If she responds to today's dose-we can consolidate dosing to 8 mg daily tomorrow.  Once she is a bit better-we will start counseling.  IV drug use (heroin): See above  Tobacco abuse: We will consult over the next few days-continue transdermal nicotine  Hypokalemia: Replete and recheck  Transaminitis: Check hep B and hep C serology given active IVDA use-could be secondary to COVID-19 as well.  Appears very mild-stable for close follow-up.  Condition - Guarded  Family Communication  : Unable to reach mother (patient gave me permission to talk to her-including regarding drug use)-Home number not in use-no one picks up the cell phone  Code Status :  Full Code  Diet :  Diet Order            Diet regular Room service appropriate? Yes; Fluid consistency: Thin  Diet effective now               Disposition Plan  :  Remain hospitalized  Barriers to discharge: MRSA bacteremia/heroin withdrawal/COVID-19 infection  Antimicorbials  :    Anti-infectives (From admission, onward)   Start     Dose/Rate Route Frequency Ordered Stop   03/20/20 1200  vancomycin (VANCOREADY) IVPB 750 mg/150 mL     750 mg 150 mL/hr over 60 Minutes Intravenous Every 12 hours 03/20/20 0435     03/20/20 0100   vancomycin (VANCOCIN) IVPB 1000 mg/200 mL premix     1,000 mg 200 mL/hr over 60 Minutes Intravenous  Once 03/20/20 0052 03/20/20 0234   03/20/20 0100  piperacillin-tazobactam (ZOSYN) IVPB 3.375 g     3.375 g 100 mL/hr over 30 Minutes Intravenous  Once 03/20/20 0052 03/20/20 0134      Inpatient Medications  Scheduled Meds: . cloNIDine  0.1 mg Oral QID   Followed by  . [START ON 03/22/2020] cloNIDine  0.1 mg Oral BH-qamhs   Followed by  . [START ON 03/24/2020] cloNIDine  0.1 mg Oral QAC breakfast  . enoxaparin (LOVENOX) injection  40 mg Subcutaneous Daily  . nicotine  21 mg Transdermal Daily   Continuous Infusions: . lactated ringers 75 mL/hr at 03/20/20 1006  . vancomycin     PRN Meds:.acetaminophen **OR** acetaminophen, dicyclomine, oxyCODONE-acetaminophen **OR** HYDROmorphone (DILAUDID) injection, hydrOXYzine, loperamide, methocarbamol, naproxen, [DISCONTINUED] ondansetron **OR** ondansetron (ZOFRAN) IV, ondansetron, polyethylene glycol   Time Spent in minutes  35    See all Orders from today for further details   Jeoffrey Massed M.D on 03/20/2020 at 10:12 AM  To page go to [email or web address] - use universal password  [facility] -  Office  [phone]    Objective:   Vitals:   03/20/20 0445 03/20/20 0500 03/20/20 0615 03/20/20 0950  BP: 106/64 102/66  115/65  Pulse: 96 100    Resp: (!) 33 (!) 32  (!) 24  Temp:   99.6 F (37.6 C)   TempSrc:   Oral   SpO2: 96% 95%    Weight:      Height:   5\' 5"  (1.651 m)     Wt Readings from Last 3 Encounters:  03/20/20 54.4 kg  01/06/18 56.7 kg (52 %, Z= 0.04)*  04/09/14 57.6 kg (75 %, Z= 0.66)*   * Growth percentiles are based on CDC (Girls, 2-20 Years) data.    No intake or output data in  the 24 hours ending 03/20/20 1012   Physical Exam Gen Exam:Alert awake-not in any distress-however extremely jittery/shaky/tremulous. HEENT:atraumatic, normocephalic Chest: B/L clear to auscultation anteriorly CVS:S1S2  regular-tachycardic Abdomen:soft non tender, non distended Extremities:no edema Neurology: Non focal Skin: no rash   Data Review:    CBC Recent Labs  Lab 03/19/20 1941  WBC 13.0*  HGB 9.4*  HCT 28.2*  PLT 383  MCV 89.2  MCH 29.7  MCHC 33.3  RDW 12.8  LYMPHSABS 1.8  MONOABS 0.9  EOSABS 0.0  BASOSABS 0.0    Chemistries  Recent Labs  Lab 03/19/20 1941 03/20/20 0616  NA 128* 132*  K 3.4* 3.1*  CL 95* 100  CO2 22 24  GLUCOSE 127* 118*  BUN 14 6  CREATININE 0.83 0.71  CALCIUM 7.6* 7.4*  AST 64* 44*  ALT 93* 71*  ALKPHOS 138* 119  BILITOT 0.7 0.6   ------------------------------------------------------------------------------------------------------------------ No results for input(s): CHOL, HDL, LDLCALC, TRIG, CHOLHDL, LDLDIRECT in the last 72 hours.  No results found for: HGBA1C ------------------------------------------------------------------------------------------------------------------ No results for input(s): TSH, T4TOTAL, T3FREE, THYROIDAB in the last 72 hours.  Invalid input(s): FREET3 ------------------------------------------------------------------------------------------------------------------ No results for input(s): VITAMINB12, FOLATE, FERRITIN, TIBC, IRON, RETICCTPCT in the last 72 hours.  Coagulation profile No results for input(s): INR, PROTIME in the last 168 hours.  Recent Labs    03/19/20 2353  DDIMER 5.91*    Cardiac Enzymes No results for input(s): CKMB, TROPONINI, MYOGLOBIN in the last 168 hours.  Invalid input(s): CK ------------------------------------------------------------------------------------------------------------------ No results found for: BNP  Micro Results Recent Results (from the past 240 hour(s))  Blood culture (routine x 2)     Status: None (Preliminary result)   Collection Time: 03/19/20 11:53 PM   Specimen: BLOOD LEFT ARM  Result Value Ref Range Status   Specimen Description BLOOD LEFT ARM  Final    Special Requests   Final    BOTTLES DRAWN AEROBIC AND ANAEROBIC Blood Culture results may not be optimal due to an excessive volume of blood received in culture bottles   Culture   Final    NO GROWTH < 12 HOURS Performed at [facility], [place] [address], [place], [place] [address]    Report Status PENDING  Incomplete  Blood culture (routine x 2)     Status: None (Preliminary result)   Collection Time: 03/20/20 12:30 AM   Specimen: BLOOD  Result Value Ref Range Status   Specimen Description BLOOD RIGHT ANTECUBITAL  Final   Special Requests   Final    BOTTLES DRAWN AEROBIC AND ANAEROBIC Blood Culture adequate volume   Culture   Final    NO GROWTH < 12 HOURS Performed at [facility], [place] [address] [place], [place] [address]    Report Status PENDING  Incomplete    Radiology Reports DG Chest Portable 1 View  Result Date: 03/20/2020 CLINICAL DATA:  COVID-19 positive, tachycardia EXAM: PORTABLE CHEST 1 VIEW COMPARISON:  Radiograph 03/10/2020 FINDINGS: There are peripheral and basilar predominant areas of mixed interstitial and consolidative airspace opacity throughout both lungs. Obscuration of the bilateral hemidiaphragms may reflect small effusions, right slightly greater than left. No pneumothorax. No convincing features of edema. The cardiomediastinal contours are unremarkable. No acute osseous or soft tissue abnormality. Telemetry leads overlie the chest. IMPRESSION: Slightly increased peripheral basilar predominant opacities likely with small effusions compatible with a multifocal infectious process. Electronically Signed   By: Lovena Le M.D.   On: 03/20/2020 00:12

## 2020-03-20 NOTE — Progress Notes (Signed)
Pharmacy Antibiotic Note  Candice Jordan is a 19 y.o. female admitted on 03/19/2020 with MRSA bacteremia.  Pharmacy has been consulted for Vancomycin dosing. Was admitted to [facility] on 4/8 and started antibiotics but left AMA. Noted pt with h/o IV drug abuse.  Zosyn 3.375gm and Vanco 1gm given in ED ~0100  Plan: Vancomycin 750 mg IV Q 12 hrs. Goal AUC 400-550. Expected AUC: 450 SCr used: 0.83 Will f/u renal function, micro data, and pt's clinical condition Vanc levels prn      Temp (24hrs), Avg:99.8 F (37.7 C), Min:99.4 F (37.4 C), Max:100.2 F (37.9 C)  Recent Labs  Lab 03/19/20 1941 03/19/20 2353  WBC 13.0*  --   CREATININE 0.83  --   LATICACIDVEN  --  2.3*    CrCl cannot be calculated (Unknown ideal weight.).    No Known Allergies  Antimicrobials this admission: 4/18 Vanc >>  4/18 Zosyn x 1  Microbiology results: 4/17 BCx:   Thank you for allowing pharmacy to be a part of this patient's care.  Christoper Fabian, PharmD, BCPS Please see amion for complete clinical pharmacist phone list 03/20/2020 3:48 AM
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
## 2020-03-20 NOTE — Progress Notes (Signed)
Lower venous duplex       has been completed. Preliminary results can be found under CV proc through chart review. Rudine Rieger, BS, RDMS, RVT   
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
## 2020-03-20 NOTE — Plan of Care (Signed)
°  Problem: Education: °Goal: Knowledge of risk factors and measures for prevention of condition will improve °Outcome: Progressing °  °Problem: Coping: °Goal: Psychosocial and spiritual needs will be supported °Outcome: Progressing °  °Problem: Respiratory: °Goal: Will maintain a patent airway °Outcome: Progressing °Goal: Complications related to the disease process, condition or treatment will be avoided or minimized °Outcome: Progressing °  °Problem: Fluid Volume: °Goal: Hemodynamic stability will improve °Outcome: Progressing °  °Problem: Clinical Measurements: °Goal: Diagnostic test results will improve °Outcome: Progressing °Goal: Signs and symptoms of infection will decrease °Outcome: Progressing °  °
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
## 2020-03-21 ENCOUNTER — Inpatient Hospital Stay (HOSPITAL_COMMUNITY): Payer: Medicaid Other

## 2020-03-21 DIAGNOSIS — I340 Nonrheumatic mitral (valve) insufficiency: Secondary | ICD-10-CM | POA: Diagnosis not present

## 2020-03-21 DIAGNOSIS — I361 Nonrheumatic tricuspid (valve) insufficiency: Secondary | ICD-10-CM | POA: Diagnosis not present

## 2020-03-21 LAB — ECHOCARDIOGRAM COMPLETE
Height: 65 in
Weight: 1920 oz

## 2020-03-21 LAB — CBC WITH DIFFERENTIAL/PLATELET
Abs Immature Granulocytes: 0.11 10*3/uL — ABNORMAL HIGH (ref 0.00–0.07)
Basophils Absolute: 0.1 10*3/uL (ref 0.0–0.1)
Basophils Relative: 1 %
Eosinophils Absolute: 0.1 10*3/uL (ref 0.0–0.5)
Eosinophils Relative: 0 %
HCT: 27.8 % — ABNORMAL LOW (ref 36.0–46.0)
Hemoglobin: 9 g/dL — ABNORMAL LOW (ref 12.0–15.0)
Immature Granulocytes: 1 %
Lymphocytes Relative: 21 %
Lymphs Abs: 2.6 10*3/uL (ref 0.7–4.0)
MCH: 29.4 pg (ref 26.0–34.0)
MCHC: 32.4 g/dL (ref 30.0–36.0)
MCV: 90.8 fL (ref 80.0–100.0)
Monocytes Absolute: 1.2 10*3/uL — ABNORMAL HIGH (ref 0.1–1.0)
Monocytes Relative: 9 %
Neutro Abs: 8.5 10*3/uL — ABNORMAL HIGH (ref 1.7–7.7)
Neutrophils Relative %: 68 %
Platelets: 400 10*3/uL (ref 150–400)
RBC: 3.06 MIL/uL — ABNORMAL LOW (ref 3.87–5.11)
RDW: 12.9 % (ref 11.5–15.5)
WBC: 12.5 10*3/uL — ABNORMAL HIGH (ref 4.0–10.5)
nRBC: 0 % (ref 0.0–0.2)

## 2020-03-21 LAB — COMPREHENSIVE METABOLIC PANEL
ALT: 57 U/L — ABNORMAL HIGH (ref 0–44)
AST: 30 U/L (ref 15–41)
Albumin: 1.6 g/dL — ABNORMAL LOW (ref 3.5–5.0)
Alkaline Phosphatase: 115 U/L (ref 38–126)
Anion gap: 8 (ref 5–15)
BUN: 5 mg/dL — ABNORMAL LOW (ref 6–20)
CO2: 25 mmol/L (ref 22–32)
Calcium: 7.7 mg/dL — ABNORMAL LOW (ref 8.9–10.3)
Chloride: 104 mmol/L (ref 98–111)
Creatinine, Ser: 0.72 mg/dL (ref 0.44–1.00)
GFR calc Af Amer: >60 mL/min (ref >60)
GFR calc non Af Amer: >60 mL/min (ref >60)
Glucose, Bld: 115 mg/dL — ABNORMAL HIGH (ref 70–99)
Potassium: 3.6 mmol/L (ref 3.5–5.1)
Sodium: 137 mmol/L (ref 135–145)
Total Bilirubin: 0.6 mg/dL (ref 0.3–1.2)
Total Protein: 6.2 g/dL — ABNORMAL LOW (ref 6.5–8.1)

## 2020-03-21 LAB — FERRITIN: Ferritin: 272 ng/mL (ref 11–307)

## 2020-03-21 LAB — D-DIMER, QUANTITATIVE: D-Dimer, Quant: 5.91 ug/mL-FEU — ABNORMAL HIGH (ref 0.00–0.50)

## 2020-03-21 LAB — MAGNESIUM: Magnesium: 1.7 mg/dL (ref 1.7–2.4)

## 2020-03-21 LAB — HEPATITIS B SURFACE ANTIGEN: Hepatitis B Surface Ag: NONREACTIVE

## 2020-03-21 LAB — HEPATITIS C ANTIBODY: HCV Ab: REACTIVE — AB

## 2020-03-21 LAB — C-REACTIVE PROTEIN: CRP: 13.8 mg/dL — ABNORMAL HIGH (ref <1.0)

## 2020-03-21 MED ORDER — NICOTINE 21 MG/24HR TD PT24
21.0000 mg | MEDICATED_PATCH | Freq: Every day | TRANSDERMAL | Status: DC
  Administered 2020-03-21: 21 mg via TRANSDERMAL
  Filled 2020-03-21: qty 1

## 2020-03-21 MED ORDER — CLONAZEPAM 0.5 MG PO TABS: 0.5000 mg | ORAL_TABLET | Freq: Three times a day (TID) | ORAL | Status: DC | PRN

## 2020-03-21 MED ORDER — BUPRENORPHINE HCL-NALOXONE HCL 2-0.5 MG SL SUBL
2.0000 | SUBLINGUAL_TABLET | SUBLINGUAL | Status: AC
  Administered 2020-03-21: 2 via SUBLINGUAL
  Filled 2020-03-21: qty 2

## 2020-03-21 MED ORDER — VANCOMYCIN HCL 750 MG/150ML IV SOLN
750.0000 mg | Freq: Two times a day (BID) | INTRAVENOUS | Status: DC
  Filled 2020-03-21: qty 150

## 2020-03-21 NOTE — Progress Notes (Signed)
Patient has been threatening to sign out AGAINST MEDICAL ADVICE numerous times today.  She is walking around in the room-brushing her hair-looks clearly better than yesterday.    I have advised her the risk of leaving AMA-advised of the life-threatening and life disabling risks of MRSA bacteremia.  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  I have recommended that she remain hospitalized.

## 2020-03-21 NOTE — Progress Notes (Signed)
  Echocardiogram 2D Echocardiogram has been performed.  Daelynn Blower A Tadao Emig 03/21/2020, 10:41 AM

## 2020-03-21 NOTE — Progress Notes (Signed)
Pharmacy Antibiotic Note  Candice Jordan is a 19 y.o. female admitted on 03/19/2020 with MRSA bacteremia.  Pharmacy has been consulted for vancomycin dosing.  Patient was given one time dose of Oritavancin on 4/18 due to potential concern of her leaving. We will resume vancomycin today due to the active bacteremia.   Repeat blood culture were sent today. WBC is trending down. Scr is stable at 0.7.  Plan:  Start Vancomycin IV 750 mg q12, estimated AUC 454 with Scr 0.8.   Monitor renal function, cultures and levels as indicated. Follow-up echo.   Height: 5\' 5"  (165.1 cm) Weight: 54.4 kg (120 lb) IBW/kg (Calculated) : 57  Temp (24hrs), Avg:98.2 F (36.8 C), Min:98 F (36.7 C), Max:98.4 F (36.9 C)  Recent Labs  Lab 03/19/20 1941 03/19/20 2353 03/20/20 0310 03/20/20 0616 03/21/20 0338  WBC 13.0*  --   --   --  12.5*  CREATININE 0.83  --   --  0.71 0.72  LATICACIDVEN  --  2.3* 1.4  --   --     Estimated Creatinine Clearance: 96.3 mL/min (by C-G formula based on SCr of 0.72 mg/dL).    No Known Allergies  Antimicrobials this admission: Vancomycin 4/19 >>  Oritavancin x 1 4/18  Microbiology results: 4/17 BCx: 2/2 MRSA 4/19 BCx: In process   Thank you for allowing pharmacy to be a part of this patient's care.  5/19 PharmD Student 03/21/2020 1:43 PM

## 2020-03-21 NOTE — Progress Notes (Signed)
PROGRESS NOTE                                                                                                                                                                                                             Patient Demographics:    Candice Jordan, is a 20 y.o. female, DOB - Mar 03, 2001, XTA:[id number]  Outpatient Primary MD for the patient is System, Provider Not In   Admit date - 03/19/2020   LOS - 1  Chief Complaint  Patient presents with  . COVID       Brief Narrative: Patient is a 19 y.o. 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 ED on 4/8 where she again signed out AMA-presented to the hospital on 4/17 with generalized body aches, weakness, poor appetite and cough-she was subsequently admitted to the hospitalist service.  See below for further details.  Significant Events: 4/17>> admit to [facility] 4/18>> lower extremity Dopplers negative for DVT 4/8>> signed out AMA from [facility] emergency room 4/7-4/8>> signed out from [facility]- admit for COVID-19 infection and MRSA bacteremia with presumed septic emboli seen on CT chest. 4/7>> CT angio chest at [facility] for PE, multifocal airspace disease consistent with septic emboli/multifocal pneumonia 4/7>> TTE at [facility]- EF 55-60%, no vegetations  COVID-19 medications: None  Antibiotics: Oritavancin 4/18 x 1 Vancomycin 4/17>> 4/18  Microbiology data: Blood culture 4/18: MRSA Blood culture 4/8 (at [facility]): MRSA Blood culture 4/7 (at [facility]): MRSA  DVT prophylaxis: SQ Lovenox  Procedures: None  Consults: ID   Subjective:   Complains of back pain-very jittery-still uncomfortable-asking for more Suboxone.  Per RN-she was threatening to leave AMA  earlier this morning   Assessment  & Plan :   MRSA bacteremia with septic emboli to the lungs: Initially given IV vancomycin-but due to concern that she may again leave AMA-she was given 1 dose of oritavancin yesterday-repeat blood cultures on 4/20.  TTE pending.  Appreciate ID input.  Hopefully she will remain inpatient complete treatment course.  She is aware that if she leaves the hospital-she is at risk of significant mortality and morbidity.  COVID-19 infection: Suspect findings and CT chest/chest x-ray more  compatible with septic emboli rather than COVID-19 pneumonia.  Inflammatory markers/D-dimer all elevated-likely all consistent with septic emboli.  We will continue to monitor off treatment for now.  Recent Labs    03/19/20 2353 03/21/20 0338  DDIMER 5.91* 5.91*  FERRITIN  --  272  LDH 224*  --   CRP 16.7* 13.8*    No results found for: SARSCOV2NAA   Elevated D-dimer: Recent CTA chest negative-lower extremity Doppler was negative for DVT.  Heroin withdrawal: Appears somewhat more comfortable than yesterday but still complaining of generalized pain-mostly in her back-we will increase Suboxone to 12 mg daily.  Reassess tomorrow-if she continues to have pain-we will increase Suboxone by 2-4 mg again.  Continue clonidine, Vistaril, Klonopin and other supportive care for withdrawal symptoms..  IV drug use (heroin): See above  Tobacco abuse: We will counsel over the next few days-continue transdermal nicotine  Hypokalemia: Repleted  Transaminitis: Check hep B and hep C serology given active IVDA use-could be secondary to COVID-19 as well.  Appears very mild-stable for close follow-up.  Condition - Guarded  Family Communication  : Unable to reach mother (patient gave me permission to talk to her-including regarding drug use)-Home number not in use-no one picks up the cell phone on 4/18, 4/19  Code Status :  Full Code  Diet :  Diet Order            Diet regular Room service  appropriate? Yes; Fluid consistency: Thin  Diet effective now               Disposition Plan  :  Remain hospitalized  Barriers to discharge: MRSA bacteremia/heroin withdrawal/COVID-19 infection  Antimicorbials  :    Anti-infectives (From admission, onward)   Start     Dose/Rate Route Frequency Ordered Stop   03/20/20 1400  Oritavancin Diphosphate (ORBACTIV) 1,200 mg in dextrose 5 % IVPB    Note to Pharmacy: Hold vancomycin for today and can restart tomorrow.   1,200 mg 333.3 mL/hr over 180 Minutes Intravenous Once 03/20/20 1351 03/20/20 1929   03/20/20 1200  vancomycin (VANCOREADY) IVPB 750 mg/150 mL  Status:  Discontinued     750 mg 150 mL/hr over 60 Minutes Intravenous Every 12 hours 03/20/20 0435 03/20/20 1351   03/20/20 0100  vancomycin (VANCOCIN) IVPB 1000 mg/200 mL premix     1,000 mg 200 mL/hr over 60 Minutes Intravenous  Once 03/20/20 0052 03/20/20 0234   03/20/20 0100  piperacillin-tazobactam (ZOSYN) IVPB 3.375 g     3.375 g 100 mL/hr over 30 Minutes Intravenous  Once 03/20/20 0052 03/20/20 0134      Inpatient Medications  Scheduled Meds: . buprenorphine-naloxone  1 tablet Sublingual Daily  . cloNIDine  0.1 mg Oral QID   Followed by  . [START ON 03/22/2020] cloNIDine  0.1 mg Oral BH-qamhs   Followed by  . [START ON 03/24/2020] cloNIDine  0.1 mg Oral QAC breakfast  . enoxaparin (LOVENOX) injection  40 mg Subcutaneous Daily  . nicotine  21 mg Transdermal Daily   Continuous Infusions:  PRN Meds:.acetaminophen **OR** acetaminophen, clonazePAM, dicyclomine, hydrOXYzine, loperamide, methocarbamol, naproxen, [DISCONTINUED] ondansetron **OR** ondansetron (ZOFRAN) IV, ondansetron, polyethylene glycol   Time Spent in minutes  35    See all Orders from today for further details   Jeoffrey MassedShanker Laquinn Shippy M.D on 03/21/2020 at 11:15 AM  To page go to [email or web address] - use universal password  [facility] -  Office  [phone]    Objective:   Vitals:   03/20/20  0615 03/20/20 0950 03/20/20 2104 03/21/20 0450  BP:  115/65 111/70 117/69  Pulse:   76 91  Resp:  (!) 24 15 20   Temp: 99.6 F (37.6 C)  98 F (36.7 C) 98.4 F (36.9 C)  TempSrc: Oral  Oral Oral  SpO2:   98% 93%  Weight:      Height: 5\' 5"  (1.651 m)       Wt Readings from Last 3 Encounters:  03/20/20 54.4 kg  01/06/18 56.7 kg (52 %, Z= 0.04)*  04/09/14 57.6 kg (75 %, Z= 0.66)*   * Growth percentiles are based on CDC (Girls, 2-20 Years) data.     Intake/Output Summary (Last 24 hours) at 03/21/2020 1115 Last data filed at 03/21/2020 0601 Gross per 24 hour  Intake 1200 ml  Output --  Net 1200 ml     Physical Exam Gen Exam: Jittery/anxious-complaining of generalized pain-mostly in the back. HEENT:atraumatic, normocephalic Chest: B/L clear to auscultation anteriorly CVS:S1S2 regular-tachycardic Abdomen:soft non tender, non distended Extremities:no edema Neurology: Moving all 4 extremities-noncooperative with exam-but appears to be intact neurologically. Skin: no rash   Data Review:    CBC Recent Labs  Lab 03/19/20 1941 03/21/20 0338  WBC 13.0* 12.5*  HGB 9.4* 9.0*  HCT 28.2* 27.8*  PLT 383 400  MCV 89.2 90.8  MCH 29.7 29.4  MCHC 33.3 32.4  RDW 12.8 12.9  LYMPHSABS 1.8 2.6  MONOABS 0.9 1.2*  EOSABS 0.0 0.1  BASOSABS 0.0 0.1    Chemistries  Recent Labs  Lab 03/19/20 1941 03/20/20 0616 03/21/20 0338  NA 128* 132* 137  K 3.4* 3.1* 3.6  CL 95* 100 104  CO2 22 24 25   GLUCOSE 127* 118* 115*  BUN 14 6 5*  CREATININE 0.83 0.71 0.72  CALCIUM 7.6* 7.4* 7.7*  MG  --   --  1.7  AST 64* 44* 30  ALT 93* 71* 57*  ALKPHOS 138* 119 115  BILITOT 0.7 0.6 0.6   ------------------------------------------------------------------------------------------------------------------ No results for input(s): CHOL, HDL, LDLCALC, TRIG, CHOLHDL, LDLDIRECT in the last 72 hours.  No results found for:  HGBA1C ------------------------------------------------------------------------------------------------------------------ No results for input(s): TSH, T4TOTAL, T3FREE, THYROIDAB in the last 72 hours.  Invalid input(s): FREET3 ------------------------------------------------------------------------------------------------------------------ Recent Labs    03/21/20 0338  FERRITIN 272    Coagulation profile No results for input(s): INR, PROTIME in the last 168 hours.  Recent Labs    03/19/20 2353 03/21/20 0338  DDIMER 5.91* 5.91*    Cardiac Enzymes No results for input(s): CKMB, TROPONINI, MYOGLOBIN in the last 168 hours.  Invalid input(s): CK ------------------------------------------------------------------------------------------------------------------ No results found for: BNP  Micro Results Recent Results (from the past 240 hour(s))  Blood culture (routine x 2)     Status: Abnormal (Preliminary result)   Collection Time: 03/19/20 11:53 PM   Specimen: BLOOD LEFT ARM  Result Value Ref Range Status   Specimen Description BLOOD LEFT ARM  Final   Special Requests   Final    BOTTLES DRAWN AEROBIC AND ANAEROBIC Blood Culture results may not be optimal due to an excessive volume of blood received in culture bottles   Culture  Setup Time   Final    GRAM POSITIVE COCCI IN CLUSTERS ANAEROBIC BOTTLE ONLY CRITICAL RESULT CALLED TO, READ BACK BY AND VERIFIED WITH: 03/21/20 03/23/20 1225 MLM Performed at [facility], [address] [place], [facility] [address]    Culture STAPHYLOCOCCUS AUREUS (A)  Final   Report Status PENDING  Incomplete  Blood Culture ID Panel (Reflexed)     Status: Abnormal   Collection Time: 03/19/20 11:53 PM  Result Value Ref Range Status   Enterococcus species NOT DETECTED NOT DETECTED Final   Listeria monocytogenes NOT DETECTED NOT DETECTED Final   Staphylococcus species DETECTED (A) NOT DETECTED Final    Comment: CRITICAL RESULT CALLED  TO, READ BACK BY AND VERIFIED WITH: PHARMD T BAUMEISTER [id number] 1225 MLM    Staphylococcus aureus (BCID) DETECTED (A) NOT DETECTED Final    Comment: Methicillin (oxacillin)-resistant Staphylococcus aureus (MRSA). MRSA is predictably resistant to beta-lactam antibiotics (except ceftaroline). Preferred therapy is vancomycin unless clinically contraindicated. Patient requires contact precautions if  hospitalized. CRITICAL RESULT CALLED TO, READ BACK BY AND VERIFIED WITH: PHARMD T BAUMEISTER [id number] MLM    Methicillin resistance DETECTED (A) NOT DETECTED Final    Comment: CRITICAL RESULT CALLED TO, READ BACK BY AND VERIFIED WITH: PHARMD T BAUMEISTER [id number] 1225 MLM    Streptococcus species NOT DETECTED NOT DETECTED Final   Streptococcus agalactiae NOT DETECTED NOT DETECTED Final   Streptococcus pneumoniae NOT DETECTED NOT DETECTED Final   Streptococcus pyogenes NOT DETECTED NOT DETECTED Final   Acinetobacter baumannii NOT DETECTED NOT DETECTED Final   Enterobacteriaceae species NOT DETECTED NOT DETECTED Final   Enterobacter cloacae complex NOT DETECTED NOT DETECTED Final   Escherichia coli NOT DETECTED NOT DETECTED Final   Klebsiella oxytoca NOT DETECTED NOT DETECTED Final   Klebsiella pneumoniae NOT DETECTED NOT DETECTED Final   Proteus species NOT DETECTED NOT DETECTED Final   Serratia marcescens NOT DETECTED NOT DETECTED Final   Haemophilus influenzae NOT DETECTED NOT DETECTED Final   Neisseria meningitidis NOT DETECTED NOT DETECTED Final   Pseudomonas aeruginosa NOT DETECTED NOT DETECTED Final   Candida albicans NOT DETECTED NOT DETECTED Final   Candida glabrata NOT DETECTED NOT DETECTED Final   Candida krusei NOT DETECTED NOT DETECTED Final   Candida parapsilosis NOT DETECTED NOT DETECTED Final   Candida tropicalis NOT DETECTED NOT DETECTED Final    Comment: Performed at [facility], [place]. [address], [place], [place] [address]  Blood culture (routine x 2)     Status:  Abnormal (Preliminary result)   Collection Time: 03/20/20 12:30 AM   Specimen: BLOOD  Result Value Ref Range Status   Specimen Description BLOOD RIGHT ANTECUBITAL  Final   Special Requests   Final    BOTTLES DRAWN AEROBIC AND ANAEROBIC Blood Culture adequate volume   Culture  Setup Time   Final    GRAM POSITIVE COCCI IN BOTH AEROBIC AND ANAEROBIC BOTTLES CRITICAL VALUE NOTED.  VALUE IS CONSISTENT WITH PREVIOUSLY REPORTED AND CALLED VALUE. Performed at [facility], [place] [address] [place], [place] [address]    Culture STAPHYLOCOCCUS AUREUS (A)  Final   Report Status PENDING  Incomplete    Radiology Reports DG Chest Portable 1 View  Result Date: 03/20/2020 CLINICAL DATA:  COVID-19 positive, tachycardia EXAM: PORTABLE CHEST 1 VIEW COMPARISON:  Radiograph 03/10/2020 FINDINGS: There are peripheral and basilar predominant areas of mixed interstitial and consolidative airspace opacity throughout both lungs. Obscuration of the bilateral hemidiaphragms may reflect small effusions, right slightly greater than left. No pneumothorax. No convincing features of edema. The cardiomediastinal contours are unremarkable. No acute osseous or soft tissue abnormality. Telemetry leads overlie the chest. IMPRESSION: Slightly increased peripheral basilar predominant opacities likely with small effusions compatible with a multifocal infectious process. Electronically Signed   By: Lovena Le M.D.   On: 03/20/2020 00:12  VAS [name] LOWER EXTREMITY VENOUS (DVT)  Result Date: 03/20/2020  Lower Venous DVTStudy Indications: D dimer, Covid.  Comparison Study: 04/12/14 negative Performing Technologist: Jeb Levering RDMS, RVT  Examination Guidelines: A complete evaluation includes B-mode imaging, spectral Doppler, color Doppler, and power Doppler as needed of all accessible portions of each vessel. Bilateral testing is considered an integral part of a complete examination. Limited examinations for reoccurring indications  may be performed as noted. The reflux portion of the exam is performed with the patient in reverse Trendelenburg.  +---------+---------------+---------+-----------+----------+--------------+ RIGHT    CompressibilityPhasicitySpontaneityPropertiesThrombus Aging +---------+---------------+---------+-----------+----------+--------------+ CFV      Full           Yes      Yes                                 +---------+---------------+---------+-----------+----------+--------------+ SFJ      Full                                                        +---------+---------------+---------+-----------+----------+--------------+ FV Prox  Full                                                        +---------+---------------+---------+-----------+----------+--------------+ FV Mid   Full                                                        +---------+---------------+---------+-----------+----------+--------------+ FV DistalFull                                                        +---------+---------------+---------+-----------+----------+--------------+ PFV      Full                                                        +---------+---------------+---------+-----------+----------+--------------+ POP      Full           Yes      Yes                                 +---------+---------------+---------+-----------+----------+--------------+ PTV      Full                                                        +---------+---------------+---------+-----------+----------+--------------+ PERO     Full                                                        +---------+---------------+---------+-----------+----------+--------------+   +---------+---------------+---------+-----------+----------+--------------+  LEFT     CompressibilityPhasicitySpontaneityPropertiesThrombus Aging +---------+---------------+---------+-----------+----------+--------------+ CFV       Full           Yes      Yes                                 +---------+---------------+---------+-----------+----------+--------------+ SFJ      Full                                                        +---------+---------------+---------+-----------+----------+--------------+ FV Prox  Full                                                        +---------+---------------+---------+-----------+----------+--------------+ FV Mid   Full                                                        +---------+---------------+---------+-----------+----------+--------------+ FV DistalFull                                                        +---------+---------------+---------+-----------+----------+--------------+ PFV      Full                                                        +---------+---------------+---------+-----------+----------+--------------+ POP      Full           Yes      Yes                                 +---------+---------------+---------+-----------+----------+--------------+ PTV      Full                                                        +---------+---------------+---------+-----------+----------+--------------+ PERO     Full                                                        +---------+---------------+---------+-----------+----------+--------------+     Summary: RIGHT: - There is no evidence of deep vein thrombosis in the lower extremity.  - No cystic structure found in the popliteal fossa.  LEFT: - There is no evidence of deep vein thrombosis in the lower extremity.  - No  cystic structure found in the popliteal fossa.  *See table(s) above for measurements and observations. Electronically signed by Sherald Hess MD on 03/20/2020 at 3:12:41 PM.    Final

## 2020-03-22 DIAGNOSIS — A419 Sepsis, unspecified organism: Secondary | ICD-10-CM | POA: Diagnosis not present

## 2020-03-22 DIAGNOSIS — A4102 Sepsis due to Methicillin resistant Staphylococcus aureus: Secondary | ICD-10-CM

## 2020-03-22 LAB — CULTURE, BLOOD (ROUTINE X 2): Special Requests: ADEQUATE
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
## 2020-03-24 ENCOUNTER — Inpatient Hospital Stay (HOSPITAL_COMMUNITY)
Admission: AD | Admit: 2020-03-24 | Discharge: 2020-03-26 | DRG: 288 | Discharge status: Against Medical Advice | Payer: Medicaid Other | Attending: Internal Medicine | Admitting: Internal Medicine

## 2020-03-24 DIAGNOSIS — K759 Inflammatory liver disease, unspecified: Secondary | ICD-10-CM | POA: Diagnosis present

## 2020-03-24 DIAGNOSIS — D649 Anemia, unspecified: Secondary | ICD-10-CM | POA: Diagnosis present

## 2020-03-24 DIAGNOSIS — F17200 Nicotine dependence, unspecified, uncomplicated: Secondary | ICD-10-CM | POA: Diagnosis present

## 2020-03-24 DIAGNOSIS — B9562 Methicillin resistant Staphylococcus aureus infection as the cause of diseases classified elsewhere: Secondary | ICD-10-CM | POA: Diagnosis present

## 2020-03-24 DIAGNOSIS — F1123 Opioid dependence with withdrawal: Secondary | ICD-10-CM | POA: Diagnosis not present

## 2020-03-24 DIAGNOSIS — Z9119 Patient's noncompliance with other medical treatment and regimen: Secondary | ICD-10-CM

## 2020-03-24 DIAGNOSIS — I079 Rheumatic tricuspid valve disease, unspecified: Secondary | ICD-10-CM | POA: Diagnosis not present

## 2020-03-24 DIAGNOSIS — F19939 Other psychoactive substance use, unspecified with withdrawal, unspecified: Secondary | ICD-10-CM | POA: Diagnosis not present

## 2020-03-24 DIAGNOSIS — E871 Hypo-osmolality and hyponatremia: Secondary | ICD-10-CM | POA: Diagnosis not present

## 2020-03-24 DIAGNOSIS — U071 COVID-19: Secondary | ICD-10-CM | POA: Diagnosis present

## 2020-03-24 DIAGNOSIS — R7881 Bacteremia: Secondary | ICD-10-CM | POA: Diagnosis present

## 2020-03-24 DIAGNOSIS — J15212 Pneumonia due to Methicillin resistant Staphylococcus aureus: Secondary | ICD-10-CM | POA: Diagnosis present

## 2020-03-24 DIAGNOSIS — F1910 Other psychoactive substance abuse, uncomplicated: Secondary | ICD-10-CM | POA: Diagnosis not present

## 2020-03-24 DIAGNOSIS — Z825 Family history of asthma and other chronic lower respiratory diseases: Secondary | ICD-10-CM

## 2020-03-24 DIAGNOSIS — I330 Acute and subacute infective endocarditis: Secondary | ICD-10-CM | POA: Diagnosis present

## 2020-03-24 DIAGNOSIS — Z8616 Personal history of COVID-19: Secondary | ICD-10-CM | POA: Diagnosis not present

## 2020-03-24 DIAGNOSIS — R600 Localized edema: Secondary | ICD-10-CM | POA: Diagnosis present

## 2020-03-24 DIAGNOSIS — Z5329 Procedure and treatment not carried out because of patient's decision for other reasons: Secondary | ICD-10-CM | POA: Diagnosis not present

## 2020-03-24 DIAGNOSIS — I2690 Septic pulmonary embolism without acute cor pulmonale: Secondary | ICD-10-CM | POA: Diagnosis present

## 2020-03-25 ENCOUNTER — Encounter (HOSPITAL_COMMUNITY): Payer: Self-pay | Admitting: Family Medicine

## 2020-03-25 DIAGNOSIS — F17200 Nicotine dependence, unspecified, uncomplicated: Secondary | ICD-10-CM

## 2020-03-25 DIAGNOSIS — F1910 Other psychoactive substance abuse, uncomplicated: Secondary | ICD-10-CM

## 2020-03-25 DIAGNOSIS — I2690 Septic pulmonary embolism without acute cor pulmonale: Secondary | ICD-10-CM

## 2020-03-25 DIAGNOSIS — B9562 Methicillin resistant Staphylococcus aureus infection as the cause of diseases classified elsewhere: Secondary | ICD-10-CM

## 2020-03-25 DIAGNOSIS — Z8616 Personal history of COVID-19: Secondary | ICD-10-CM

## 2020-03-25 DIAGNOSIS — F19939 Other psychoactive substance use, unspecified with withdrawal, unspecified: Secondary | ICD-10-CM

## 2020-03-25 DIAGNOSIS — E871 Hypo-osmolality and hyponatremia: Secondary | ICD-10-CM

## 2020-03-25 DIAGNOSIS — I330 Acute and subacute infective endocarditis: Principal | ICD-10-CM

## 2020-03-25 DIAGNOSIS — I079 Rheumatic tricuspid valve disease, unspecified: Secondary | ICD-10-CM | POA: Diagnosis present

## 2020-03-25 DIAGNOSIS — U071 COVID-19: Secondary | ICD-10-CM

## 2020-03-25 DIAGNOSIS — R7881 Bacteremia: Secondary | ICD-10-CM

## 2020-03-25 LAB — COMPREHENSIVE METABOLIC PANEL
ALT: 40 U/L (ref 0–44)
AST: 44 U/L — ABNORMAL HIGH (ref 15–41)
Albumin: 1.5 g/dL — ABNORMAL LOW (ref 3.5–5.0)
Alkaline Phosphatase: 91 U/L (ref 38–126)
Anion gap: 9 (ref 5–15)
BUN: 5 mg/dL — ABNORMAL LOW (ref 6–20)
CO2: 26 mmol/L (ref 22–32)
Calcium: 7.6 mg/dL — ABNORMAL LOW (ref 8.9–10.3)
Chloride: 100 mmol/L (ref 98–111)
Creatinine, Ser: 0.5 mg/dL (ref 0.44–1.00)
GFR calc Af Amer: >60 mL/min (ref >60)
GFR calc non Af Amer: >60 mL/min (ref >60)
Glucose, Bld: 103 mg/dL — ABNORMAL HIGH (ref 70–99)
Potassium: 4 mmol/L (ref 3.5–5.1)
Sodium: 135 mmol/L (ref 135–145)
Total Bilirubin: 0.6 mg/dL (ref 0.3–1.2)
Total Protein: 6.7 g/dL (ref 6.5–8.1)

## 2020-03-25 LAB — CBC WITH DIFFERENTIAL/PLATELET
Abs Immature Granulocytes: 0.11 10*3/uL — ABNORMAL HIGH (ref 0.00–0.07)
Basophils Absolute: 0 10*3/uL (ref 0.0–0.1)
Basophils Relative: 0 %
Eosinophils Absolute: 0.1 10*3/uL (ref 0.0–0.5)
Eosinophils Relative: 1 %
HCT: 26.2 % — ABNORMAL LOW (ref 36.0–46.0)
Hemoglobin: 8.2 g/dL — ABNORMAL LOW (ref 12.0–15.0)
Immature Granulocytes: 1 %
Lymphocytes Relative: 23 %
Lymphs Abs: 2.9 10*3/uL (ref 0.7–4.0)
MCH: 28.8 pg (ref 26.0–34.0)
MCHC: 31.3 g/dL (ref 30.0–36.0)
MCV: 91.9 fL (ref 80.0–100.0)
Monocytes Absolute: 0.7 10*3/uL (ref 0.1–1.0)
Monocytes Relative: 5 %
Neutro Abs: 8.6 10*3/uL — ABNORMAL HIGH (ref 1.7–7.7)
Neutrophils Relative %: 70 %
Platelets: 242 10*3/uL (ref 150–400)
RBC: 2.85 MIL/uL — ABNORMAL LOW (ref 3.87–5.11)
RDW: 13 % (ref 11.5–15.5)
WBC: 12.3 10*3/uL — ABNORMAL HIGH (ref 4.0–10.5)
nRBC: 0 % (ref 0.0–0.2)

## 2020-03-25 LAB — LACTATE DEHYDROGENASE: LDH: 242 U/L — ABNORMAL HIGH (ref 98–192)

## 2020-03-25 LAB — FIBRINOGEN: Fibrinogen: 418 mg/dL (ref 210–475)

## 2020-03-25 LAB — PROCALCITONIN: Procalcitonin: 0.85 ng/mL

## 2020-03-25 LAB — C-REACTIVE PROTEIN: CRP: 21.1 mg/dL — ABNORMAL HIGH (ref <1.0)

## 2020-03-25 LAB — FERRITIN: Ferritin: 268 ng/mL (ref 11–307)

## 2020-03-25 LAB — BRAIN NATRIURETIC PEPTIDE: B Natriuretic Peptide: 104.3 pg/mL — ABNORMAL HIGH (ref 0.0–100.0)

## 2020-03-25 LAB — D-DIMER, QUANTITATIVE: D-Dimer, Quant: 6.64 ug/mL-FEU — ABNORMAL HIGH (ref 0.00–0.50)

## 2020-03-25 LAB — ABO/RH: ABO/RH(D): O POS

## 2020-03-25 MED ORDER — ONDANSETRON HCL 4 MG/2ML IJ SOLN: 4.0000 mg | Freq: Four times a day (QID) | INTRAMUSCULAR | Status: DC | PRN

## 2020-03-25 MED ORDER — OXYCODONE HCL 5 MG PO TABS
15.0000 mg | ORAL_TABLET | ORAL | Status: DC | PRN
  Administered 2020-03-25 – 2020-03-26 (×5): 15 mg via ORAL
  Filled 2020-03-25 (×5): qty 3

## 2020-03-25 MED ORDER — ACETAMINOPHEN 650 MG RE SUPP: 650.0000 mg | Freq: Four times a day (QID) | RECTAL | Status: DC | PRN

## 2020-03-25 MED ORDER — POLYETHYLENE GLYCOL 3350 17 G PO PACK
17.0000 g | PACK | Freq: Every day | ORAL | Status: DC
  Administered 2020-03-25 – 2020-03-26 (×2): 17 g via ORAL
  Filled 2020-03-25 (×2): qty 1

## 2020-03-25 MED ORDER — ONDANSETRON HCL 4 MG PO TABS: 4.0000 mg | ORAL_TABLET | Freq: Four times a day (QID) | ORAL | Status: DC | PRN

## 2020-03-25 MED ORDER — ENOXAPARIN SODIUM 40 MG/0.4ML ~~LOC~~ SOLN
40.0000 mg | Freq: Every day | SUBCUTANEOUS | Status: DC
  Administered 2020-03-25 – 2020-03-26 (×2): 40 mg via SUBCUTANEOUS
  Filled 2020-03-25 (×2): qty 0.4

## 2020-03-25 MED ORDER — VANCOMYCIN HCL IN DEXTROSE 1-5 GM/200ML-% IV SOLN
1000.0000 mg | Freq: Two times a day (BID) | INTRAVENOUS | Status: DC
  Administered 2020-03-25 – 2020-03-26 (×3): 1000 mg via INTRAVENOUS
  Filled 2020-03-25 (×4): qty 200

## 2020-03-25 MED ORDER — METHADONE HCL 5 MG PO TABS
15.0000 mg | ORAL_TABLET | Freq: Every day | ORAL | Status: DC
  Administered 2020-03-25: 15 mg via ORAL
  Filled 2020-03-25: qty 3

## 2020-03-25 MED ORDER — SENNOSIDES-DOCUSATE SODIUM 8.6-50 MG PO TABS: 1.0000 | ORAL_TABLET | Freq: Every evening | ORAL | Status: DC | PRN

## 2020-03-25 MED ORDER — SODIUM CHLORIDE 0.9% FLUSH
3.0000 mL | Freq: Two times a day (BID) | INTRAVENOUS | Status: DC
  Administered 2020-03-25: 3 mL via INTRAVENOUS

## 2020-03-25 MED ORDER — SODIUM CHLORIDE 0.9 % IV SOLN: 250.0000 mL | INTRAVENOUS | Status: DC | PRN

## 2020-03-25 MED ORDER — ORITAVANCIN DIPHOSPHATE 400 MG IV SOLR: 1200.0000 mg | Freq: Once | INTRAVENOUS | Status: DC

## 2020-03-25 MED ORDER — VANCOMYCIN HCL 750 MG/150ML IV SOLN
750.0000 mg | Freq: Two times a day (BID) | INTRAVENOUS | Status: DC
  Administered 2020-03-25: 750 mg via INTRAVENOUS
  Filled 2020-03-25 (×2): qty 150

## 2020-03-25 MED ORDER — ACETAMINOPHEN 325 MG PO TABS
650.0000 mg | ORAL_TABLET | Freq: Four times a day (QID) | ORAL | Status: DC | PRN
  Administered 2020-03-25 – 2020-03-26 (×4): 650 mg via ORAL
  Filled 2020-03-25 (×4): qty 2

## 2020-03-25 MED ORDER — SODIUM CHLORIDE 0.9% FLUSH: 3.0000 mL | INTRAVENOUS | Status: DC | PRN

## 2020-03-25 MED ORDER — GUAIFENESIN-DM 100-10 MG/5ML PO SYRP
10.0000 mL | ORAL_SOLUTION | ORAL | Status: DC | PRN
  Administered 2020-03-26 (×3): 10 mL via ORAL
  Filled 2020-03-25 (×3): qty 10

## 2020-03-25 MED ORDER — METHADONE HCL 5 MG PO TABS: 20.0000 mg | ORAL_TABLET | Freq: Every day | ORAL | Status: DC

## 2020-03-25 MED ORDER — OXYCODONE HCL 5 MG PO TABS: 10.0000 mg | ORAL_TABLET | ORAL | Status: DC | PRN

## 2020-03-25 MED ORDER — HYDROCODONE-ACETAMINOPHEN 5-325 MG PO TABS: 1.0000 | ORAL_TABLET | ORAL | Status: DC | PRN

## 2020-03-25 NOTE — Progress Notes (Addendum)
PROGRESS NOTE                                                                                                                                                                                                             Patient Demographics:    Candice Jordan, is a 19 y.o. female, DOB - 02-Feb-2001, ZOX:[id number]  Outpatient Primary MD for the patient is System, Provider Not In   Admit date - 03/24/2020   LOS - 1  No chief complaint on file.      Brief Narrative: Patient is a 19 y.o. 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  See below for further details  Significant Events: 4/23>>Readmit to [facility]-ongoing fever, malaise 4/17-4/19>> admit to [facility]-but signed out AMA 4/8>> signed out AMA from [facility] emergency room 4/7-4/8>> signed out from [facility]- admit for COVID-19 infection and MRSA bacteremia with presumed septic emboli seen on CT chest.  Significant studies 4/19>> TTE at [facility] tricuspid valve endocarditis 4/18>> lower extremity Dopplers negative for DVT 4/7>> CT angio chest at [facility] for PE, multifocal airspace disease consistent with septic emboli/multifocal pneumonia 4/7>> TTE at [facility]- EF 55-60%, no vegetations  COVID-19 medications: None  Antibiotics: Vancomycin 4/22>> Oritavancin 4/18 x 1 Vancomycin 4/17>> 4/18  Microbiology data: Blood culture: 4/23: No growth Blood culture 4/19: No growth Blood culture 4/18: MRSA Blood culture 4/8 (at [facility]): MRSA Blood culture 4/7 (at [facility]): MRSA  DVT prophylaxis: SQ Lovenox  Procedures: None  Consults: ID   Subjective:   Seems comfortable-after initially denying-acknowledged that she last used IV heroin on 4/21.  Does not want to try Suboxone-asking for methadone.   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  Have again explained to patient that if she leaves the hospital AMA-she is at risk of significant mortality and morbidity.  COVID-19 infection: Suspect findings and CT chest/chest x-ray more compatible with septic emboli rather than COVID-19 pneumonia.  She is more than 2 weeks out from initial diagnosis.  Inflammatory markers/D-dimer all elevated-likely all consistent with septic emboli.  We will continue to monitor off treatment for now.   Recent Labs    03/25/20 0702  DDIMER 6.64*  FERRITIN 268  LDH 242*  CRP 21.1*    No results found for: SARSCOV2NAA   Elevated D-dimer: Recent CTA chest negative-lower extremity Doppler was negative for DVT.  She remains on room air-without hypoxia and overall stability-doubt further work-up required.  Heroin withdrawal: Beginning to have withdrawal symptoms-acknowledges last used heroin use on 4/21-does not want to try Suboxone anymore-asking for methadone-start 15 mg of methadone daily-she is aware that she will not be prescribed methadone on discharge.  She is aware that-dosage of methadone will be adjusted slowly-depending upon tolerance/side effects/clinical effect etc.  IV drug use (heroin): Counseled-extensively-she is aware of the deleterious effects of heroin use.  Tobacco abuse: We will counsel over the next few days-continue transdermal nicotine  Transaminitis: Likely secondary to COVID-19/MRSA bacteremia-however hepatitis C antibodies positive-we will check viral load.  Hepatitis antibody positive: Check viral load with a.m. labs  Noncompliance/history of leaving AMA: Counseled extensively-she is aware of life-threatening/life disabling effects of leaving AMA.  Condition - Guarded   Family Communication  : Called grandmother on 4/23-no answer  She does not want [name] communicating with her mother (RN at bedside-surprisingly during her last admission-she was okay with me contacting her mother-who never answered the phone).   She was okay to talk to her grandmother-including about drug use (note RN at bedside)  Code Status :  Full Code  Diet :  Diet Order            Diet Heart Room service appropriate? Yes; Fluid consistency: Thin  Diet effective now               Disposition Plan  :  Remain hospitalized  Barriers to discharge: MRSA bacteremia/heroin withdrawal/COVID-19 infection  Antimicorbials  :    Anti-infectives (From admission, onward)   Start     Dose/Rate Route Frequency Ordered Stop   03/27/20 0800  Oritavancin Diphosphate (ORBACTIV) 1,200 mg in dextrose 5 % IVPB     1,200 mg 333.3 mL/hr over 180 Minutes Intravenous Once 03/25/20 1440     03/25/20 1800  vancomycin (VANCOCIN) IVPB 1000 mg/200 mL premix     1,000 mg 200 mL/hr over 60 Minutes Intravenous Every 12 hours 03/25/20 1340     03/25/20 0600  vancomycin (VANCOREADY) IVPB 750 mg/150 mL  Status:  Discontinued     750 mg 150 mL/hr over 60 Minutes Intravenous Every 12 hours 03/25/20 0114 03/25/20 1340      Inpatient Medications  Scheduled Meds: . enoxaparin (LOVENOX) injection  40 mg Subcutaneous Daily  . methadone  15 mg Oral Daily  . sodium chloride flush  3 mL Intravenous Q12H  . sodium chloride flush  3 mL Intravenous Q12H   Continuous Infusions: . sodium chloride    . [START ON 03/27/2020] oritavancin (ORBACTIV) IVPB    . vancomycin     PRN Meds:.sodium chloride, acetaminophen **OR** acetaminophen, guaiFENesin-dextromethorphan, ondansetron **OR** ondansetron (ZOFRAN) IV, senna-docusate, sodium chloride flush   Time Spent in minutes  25    See all Orders from today for further details   Oren Binet M.D on 03/25/2020 at 3:15 PM  To page go to [email or web address] - use universal password  [facility] -  Office  [phone]    Objective:   Vitals:   03/25/20 0631 03/25/20 0755 03/25/20 0800 03/25/20 1200  BP: (!) 96/58 106/60  120/66  Pulse: 73 89 98 96  Resp: 18 14 (!) 21 20  Temp:   98.2 F (36.8 C)  98.2 F (36.8 C)  TempSrc:   Oral Oral  SpO2:  99% 92% 99% 93%  Weight:      Height:        Wt Readings from Last 3 Encounters:  03/24/20 56.9 kg  03/20/20 54.4 kg  01/06/18 56.7 kg (52 %, Z= 0.04)*   * Growth percentiles are based on CDC (Girls, 2-20 Years) data.     Intake/Output Summary (Last 24 hours) at 03/25/2020 1515 Last data filed at 03/25/2020 0522 Gross per 24 hour  Intake 390 ml  Output --  Net 390 ml     Physical Exam Gen Exam:Alert awake-not in any distress HEENT:atraumatic, normocephalic Chest: B/L clear to auscultation anteriorly CVS:S1S2 regular-+soft syst murmur Abdomen:soft non tender, non distended Extremities:no edema Neurology: Non focal Skin: no rash   Data Review:    CBC Recent Labs  Lab 03/19/20 1941 03/21/20 0338 03/25/20 0702  WBC 13.0* 12.5* 12.3*  HGB 9.4* 9.0* 8.2*  HCT 28.2* 27.8* 26.2*  PLT 383 400 242  MCV 89.2 90.8 91.9  MCH 29.7 29.4 28.8  MCHC 33.3 32.4 31.3  RDW 12.8 12.9 13.0  LYMPHSABS 1.8 2.6 2.9  MONOABS 0.9 1.2* 0.7  EOSABS 0.0 0.1 0.1  BASOSABS 0.0 0.1 0.0    Chemistries  Recent Labs  Lab 03/19/20 1941 03/20/20 0616 03/21/20 0338 03/25/20 0702  NA 128* 132* 137 135  K 3.4* 3.1* 3.6 4.0  CL 95* 100 104 100  CO2 22 24 25 26   GLUCOSE 127* 118* 115* 103*  BUN 14 6 5* 5*  CREATININE 0.83 0.71 0.72 0.50  CALCIUM 7.6* 7.4* 7.7* 7.6*  MG  --   --  1.7  --   AST 64* 44* 30 44*  ALT 93* 71* 57* 40  ALKPHOS 138* 119 115 91  BILITOT 0.7 0.6 0.6 0.6   ------------------------------------------------------------------------------------------------------------------ No results for input(s): CHOL, HDL, LDLCALC, TRIG, CHOLHDL, LDLDIRECT in the last 72 hours.  No results found for: HGBA1C ------------------------------------------------------------------------------------------------------------------ No results for input(s): TSH, T4TOTAL, T3FREE, THYROIDAB in the last 72 hours.  Invalid input(s):  FREET3 ------------------------------------------------------------------------------------------------------------------ Recent Labs    03/25/20 0702  FERRITIN 268    Coagulation profile No results for input(s): INR, PROTIME in the last 168 hours.  Recent Labs    03/25/20 0702  DDIMER 6.64*    Cardiac Enzymes No results for input(s): CKMB, TROPONINI, MYOGLOBIN in the last 168 hours.  Invalid input(s): CK ------------------------------------------------------------------------------------------------------------------    Component Value Date/Time   BNP 104.3 (H) 03/25/2020 03/27/2020    Micro Results Recent Results (from the past 240 hour(s))  Blood culture (routine x 2)     Status: Abnormal   Collection Time: 03/19/20 11:53 PM   Specimen: BLOOD LEFT ARM  Result Value Ref Range Status   Specimen Description BLOOD LEFT ARM  Final   Special Requests   Final    BOTTLES DRAWN AEROBIC AND ANAEROBIC Blood Culture results may not be optimal due to an excessive volume of blood received in culture bottles   Culture  Setup Time   Final    GRAM POSITIVE COCCI IN CLUSTERS ANAEROBIC BOTTLE ONLY CRITICAL RESULT CALLED TO, READ BACK BY AND VERIFIED WITH: 03/21/20 Verdia Kuba 1225 MLM Performed at [facility], [address], [place], [place]    Culture METHICILLIN RESISTANT STAPHYLOCOCCUS AUREUS (A)  Final   Report Status 03/22/2020 FINAL  Final   Organism ID, Bacteria METHICILLIN RESISTANT STAPHYLOCOCCUS AUREUS  Final      Susceptibility   Methicillin resistant staphylococcus aureus - MIC*  CIPROFLOXACIN >=8 RESISTANT Resistant     ERYTHROMYCIN >=8 RESISTANT Resistant     GENTAMICIN <=0.5 SENSITIVE Sensitive     OXACILLIN >=4 RESISTANT Resistant     TETRACYCLINE <=1 SENSITIVE Sensitive     VANCOMYCIN 1 SENSITIVE Sensitive     TRIMETH/SULFA <=10 SENSITIVE Sensitive     CLINDAMYCIN <=0.25 SENSITIVE Sensitive     RIFAMPIN <=0.5 SENSITIVE Sensitive      Inducible Clindamycin NEGATIVE Sensitive     * METHICILLIN RESISTANT STAPHYLOCOCCUS AUREUS  Blood Culture ID Panel (Reflexed)     Status: Abnormal   Collection Time: 03/19/20 11:53 PM  Result Value Ref Range Status   Enterococcus species NOT DETECTED NOT DETECTED Final   Listeria monocytogenes NOT DETECTED NOT DETECTED Final   Staphylococcus species DETECTED (A) NOT DETECTED Final    Comment: CRITICAL RESULT CALLED TO, READ BACK BY AND VERIFIED WITH: PHARMD T BAUMEISTER [id number] 1225 MLM    Staphylococcus aureus (BCID) DETECTED (A) NOT DETECTED Final    Comment: Methicillin (oxacillin)-resistant Staphylococcus aureus (MRSA). MRSA is predictably resistant to beta-lactam antibiotics (except ceftaroline). Preferred therapy is vancomycin unless clinically contraindicated. Patient requires contact precautions if  hospitalized. CRITICAL RESULT CALLED TO, READ BACK BY AND VERIFIED WITH: PHARMD T BAUMEISTER [id number] 1225 MLM    Methicillin resistance DETECTED (A) NOT DETECTED Final    Comment: CRITICAL RESULT CALLED TO, READ BACK BY AND VERIFIED WITH: PHARMD T BAUMEISTER [id number] 1225 MLM    Streptococcus species NOT DETECTED NOT DETECTED Final   Streptococcus agalactiae NOT DETECTED NOT DETECTED Final   Streptococcus pneumoniae NOT DETECTED NOT DETECTED Final   Streptococcus pyogenes NOT DETECTED NOT DETECTED Final   Acinetobacter baumannii NOT DETECTED NOT DETECTED Final   Enterobacteriaceae species NOT DETECTED NOT DETECTED Final   Enterobacter cloacae complex NOT DETECTED NOT DETECTED Final   Escherichia coli NOT DETECTED NOT DETECTED Final   Klebsiella oxytoca NOT DETECTED NOT DETECTED Final   Klebsiella pneumoniae NOT DETECTED NOT DETECTED Final   Proteus species NOT DETECTED NOT DETECTED Final   Serratia marcescens NOT DETECTED NOT DETECTED Final   Haemophilus influenzae NOT DETECTED NOT DETECTED Final   Neisseria meningitidis NOT DETECTED NOT DETECTED Final   Pseudomonas aeruginosa NOT  DETECTED NOT DETECTED Final   Candida albicans NOT DETECTED NOT DETECTED Final   Candida glabrata NOT DETECTED NOT DETECTED Final   Candida krusei NOT DETECTED NOT DETECTED Final   Candida parapsilosis NOT DETECTED NOT DETECTED Final   Candida tropicalis NOT DETECTED NOT DETECTED Final    Comment: Performed at [facility], [address], [place], [place] [address]  Blood culture (routine x 2)     Status: Abnormal   Collection Time: 03/20/20 12:30 AM   Specimen: BLOOD  Result Value Ref Range Status   Specimen Description BLOOD RIGHT ANTECUBITAL  Final   Special Requests   Final    BOTTLES DRAWN AEROBIC AND ANAEROBIC Blood Culture adequate volume   Culture  Setup Time   Final    GRAM POSITIVE COCCI IN BOTH AEROBIC AND ANAEROBIC BOTTLES CRITICAL VALUE NOTED.  VALUE IS CONSISTENT WITH PREVIOUSLY REPORTED AND CALLED VALUE.    Culture (A)  Final    STAPHYLOCOCCUS AUREUS SUSCEPTIBILITIES PERFORMED ON PREVIOUS CULTURE WITHIN THE LAST 5 DAYS. Performed at [facility], [address] [place], [place] [address]    Report Status 03/22/2020 FINAL  Final  Culture, blood (routine x 2)     Status: None (Preliminary result)   Collection Time:  03/21/20 11:51 AM   Specimen: BLOOD  Result Value Ref Range Status   Specimen Description BLOOD BLOOD RIGHT HAND  Final   Special Requests   Final    BOTTLES DRAWN AEROBIC AND ANAEROBIC Blood Culture adequate volume   Culture   Final    NO GROWTH 4 DAYS Performed at [facility], [address] [place], [place] [address]    Report Status PENDING  Incomplete  Culture, blood (routine x 2)     Status: None (Preliminary result)   Collection Time: 03/21/20 11:51 AM   Specimen: BLOOD  Result Value Ref Range Status   Specimen Description BLOOD BLOOD RIGHT HAND  Final   Special Requests   Final    BOTTLES DRAWN AEROBIC AND ANAEROBIC Blood Culture adequate volume   Culture   Final    NO GROWTH 4 DAYS Performed at [facility], [address] [place], [place] [address]    Report Status PENDING  Incomplete  Culture, blood (routine x 2)     Status: None (Preliminary result)   Collection Time: 03/25/20  6:54 AM   Specimen: BLOOD  Result Value Ref Range Status   Specimen Description BLOOD RIGHT ANTECUBITAL  Final   Special Requests   Final    BOTTLES DRAWN AEROBIC ONLY Blood Culture adequate volume   Culture   Final    NO GROWTH < 12 HOURS Performed at [facility], [address] [place], [place] [address]    Report Status PENDING  Incomplete  Culture, blood (routine x 2)     Status: None (Preliminary result)   Collection Time: 03/25/20  6:58 AM   Specimen: BLOOD LEFT HAND  Result Value Ref Range Status   Specimen Description BLOOD LEFT HAND  Final   Special Requests   Final    BOTTLES DRAWN AEROBIC ONLY Blood Culture adequate volume   Culture   Final    NO GROWTH < 12 HOURS Performed at [facility], [address] [place], [place] [address]    Report Status PENDING  Incomplete    Radiology Reports DG Chest Portable 1 View  Result Date: 03/20/2020 CLINICAL DATA:  COVID-19 positive, tachycardia EXAM: PORTABLE CHEST 1 VIEW COMPARISON:  Radiograph 03/10/2020 FINDINGS: There are peripheral and basilar predominant areas of mixed interstitial and consolidative airspace opacity throughout both lungs. Obscuration of the bilateral hemidiaphragms may reflect small effusions, right slightly greater than left. No pneumothorax. No convincing features of edema. The cardiomediastinal contours are unremarkable. No acute osseous or soft tissue abnormality. Telemetry leads overlie the chest. IMPRESSION: Slightly increased peripheral basilar predominant opacities likely with small effusions compatible with a multifocal infectious process. Electronically Signed   By: Kreg Shropshire M.D.   On: 03/20/2020 00:12   ECHOCARDIOGRAM COMPLETE  Result Date: 03/21/2020    ECHOCARDIOGRAM REPORT   Patient Name:   LIV RALLIS Date of Exam: 03/21/2020 Medical Rec #:  [id number]     Height:       65.0 in Accession #:    [id number]    Weight:       120.0 lb Date of Birth:  [birth year]    BSA:          1.592 m Patient Age:    18 years      BP:           117/69 mmHg Patient Gender: F             HR:  91 bpm. Exam Location:  Inpatient Procedure: 2D Echo Indications:    Endocarditis I38  History:        Patient has no prior history of Echocardiogram examinations.                 Signs/Symptoms:MRSA; Risk Factors:Intravenous drug abuse and                 Current Smoker.  Sonographer:    Leeroy Bock Turrentine Referring Phys: [id number] Marinda Elk  Sonographer Comments: Covid 19 positive IMPRESSIONS  1. Tricuspid valve endocarditis.  2. Left ventricular ejection fraction, by estimation, is 60 to 65%. The left ventricle has normal function. The left ventricle has no regional wall motion abnormalities. Left ventricular diastolic parameters were normal.  3. Right ventricular systolic function is normal. The right ventricular size is normal. There is normal pulmonary artery systolic pressure.  4. The mitral valve is normal in structure. Mild mitral valve regurgitation. No evidence of mitral stenosis.  5. Moderately thickened tricuspid valve, with small 0.7 x 0.9 cm non mobile posterior leaflet vegetation.. The tricuspid valve is abnormal. Tricuspid valve regurgitation is moderate.  6. The aortic valve is normal in structure. Aortic valve regurgitation is not visualized. No aortic stenosis is present.  7. The inferior vena cava is normal in size with greater than 50% respiratory variability, suggesting right atrial pressure of 3 mmHg. FINDINGS  Left Ventricle: Left ventricular ejection fraction, by estimation, is 60 to 65%. The left ventricle has normal function. The left ventricle has no regional wall motion abnormalities. The left ventricular internal cavity size was normal in size. There is  no left ventricular hypertrophy. Left  ventricular diastolic parameters were normal. Right Ventricle: The right ventricular size is normal. No increase in right ventricular wall thickness. Right ventricular systolic function is normal. There is normal pulmonary artery systolic pressure. The tricuspid regurgitant velocity is 2.32 m/s, and  with an assumed right atrial pressure of 3 mmHg, the estimated right ventricular systolic pressure is 24.5 mmHg. Left Atrium: Left atrial size was normal in size. Right Atrium: Right atrial size was normal in size. Pericardium: There is no evidence of pericardial effusion. Mitral Valve: The mitral valve is normal in structure. Normal mobility of the mitral valve leaflets. Mild mitral valve regurgitation. No evidence of mitral valve stenosis. Tricuspid Valve: Moderately thickened tricuspid valve, with small 0.7 x 0.9 cm non mobile posterior leaflet vegetation. The tricuspid valve is abnormal. Tricuspid valve regurgitation is moderate . No evidence of tricuspid stenosis. Aortic Valve: The aortic valve is normal in structure. Aortic valve regurgitation is not visualized. No aortic stenosis is present. Aortic valve mean gradient measures 5.0 mmHg. Aortic valve peak gradient measures 9.1 mmHg. Aortic valve area, by VTI measures 2.23 cm. Pulmonic Valve: The pulmonic valve was normal in structure. Pulmonic valve regurgitation is not visualized. No evidence of pulmonic stenosis. Aorta: The aortic root is normal in size and structure. Venous: The inferior vena cava is normal in size with greater than 50% respiratory variability, suggesting right atrial pressure of 3 mmHg. IAS/Shunts: No atrial level shunt detected by color flow Doppler.  LEFT VENTRICLE PLAX 2D LVIDd:         4.50 cm  Diastology LVIDs:         3.10 cm  LV e' lateral:   18.60 cm/s LV PW:         0.90 cm  LV E/e' lateral: 5.0 LV IVS:        0.90  cm  LV e' medial:    11.60 cm/s LVOT diam:     1.90 cm  LV E/e' medial:  8.0 LV SV:         66 LV SV Index:   42 LVOT  Area:     2.84 cm  RIGHT VENTRICLE RV S prime:     14.40 cm/s TAPSE (M-mode): 2.9 cm LEFT ATRIUM             Index LA diam:        3.00 cm 1.88 cm/m LA Vol (A2C):   43.6 ml 27.39 ml/m LA Vol (A4C):   54.1 ml 33.98 ml/m LA Biplane Vol: 50.7 ml 31.84 ml/m  AORTIC VALVE AV Area (Vmax):    2.25 cm AV Area (Vmean):   2.28 cm AV Area (VTI):     2.23 cm AV Vmax:           151.00 cm/s AV Vmean:          110.000 cm/s AV VTI:            0.297 m AV Peak Grad:      9.1 mmHg AV Mean Grad:      5.0 mmHg LVOT Vmax:         120.00 cm/s LVOT Vmean:        88.400 cm/s LVOT VTI:          0.234 m LVOT/AV VTI ratio: 0.79  AORTA Ao Root diam: 2.70 cm MITRAL VALVE               TRICUSPID VALVE MV Area (PHT): 4.21 cm    TR Peak grad:   21.5 mmHg MV Decel Time: 180 msec    TR Vmax:        232.00 cm/s MV E velocity: 92.60 cm/s MV A velocity: 49.50 cm/s  SHUNTS MV E/A ratio:  1.87        Systemic VTI:  0.23 m                            Systemic Diam: 1.90 cm Donato SchultzMark Skains MD Electronically signed by Donato SchultzMark Skains MD Signature Date/Time: 03/21/2020/1:09:57 PM    Final    VAS US LOWER EXTREMITY VENOUS (DVT)  Result Date: 03/20/2020  Lower Venous DVTStudy Indications: D dimer, Covid.  Comparison Study: 04/12/14 negative Performing Technologist: Jeb LeveringJill Parker RDMS, RVT  Examination Guidelines: A complete evaluation includes B-mode imaging, spectral Doppler, color Doppler, and power Doppler as needed of all accessible portions of each vessel. Bilateral testing is considered an integral part of a complete examination. Limited examinations for reoccurring indications may be performed as noted. The reflux portion of the exam is performed with the patient in reverse Trendelenburg.  +---------+---------------+---------+-----------+----------+--------------+ RIGHT    CompressibilityPhasicitySpontaneityPropertiesThrombus Aging +---------+---------------+---------+-----------+----------+--------------+ CFV      Full           Yes      Yes                                  +---------+---------------+---------+-----------+----------+--------------+ SFJ      Full                                                        +---------+---------------+---------+-----------+----------+--------------+  FV Prox  Full                                                        +---------+---------------+---------+-----------+----------+--------------+ FV Mid   Full                                                        +---------+---------------+---------+-----------+----------+--------------+ FV DistalFull                                                        +---------+---------------+---------+-----------+----------+--------------+ PFV      Full                                                        +---------+---------------+---------+-----------+----------+--------------+ POP      Full           Yes      Yes                                 +---------+---------------+---------+-----------+----------+--------------+ PTV      Full                                                        +---------+---------------+---------+-----------+----------+--------------+ PERO     Full                                                        +---------+---------------+---------+-----------+----------+--------------+   +---------+---------------+---------+-----------+----------+--------------+ LEFT     CompressibilityPhasicitySpontaneityPropertiesThrombus Aging +---------+---------------+---------+-----------+----------+--------------+ CFV      Full           Yes      Yes                                 +---------+---------------+---------+-----------+----------+--------------+ SFJ      Full                                                        +---------+---------------+---------+-----------+----------+--------------+ FV Prox  Full                                                         +---------+---------------+---------+-----------+----------+--------------+  FV Mid   Full                                                        +---------+---------------+---------+-----------+----------+--------------+ FV DistalFull                                                        +---------+---------------+---------+-----------+----------+--------------+ PFV      Full                                                        +---------+---------------+---------+-----------+----------+--------------+ POP      Full           Yes      Yes                                 +---------+---------------+---------+-----------+----------+--------------+ PTV      Full                                                        +---------+---------------+---------+-----------+----------+--------------+ PERO     Full                                                        +---------+---------------+---------+-----------+----------+--------------+     Summary: RIGHT: - There is no evidence of deep vein thrombosis in the lower extremity.  - No cystic structure found in the popliteal fossa.  LEFT: - There is no evidence of deep vein thrombosis in the lower extremity.  - No cystic structure found in the popliteal fossa.  *See table(s) above for measurements and observations. Electronically signed by Sherald Hess MD on 03/20/2020 at 3:12:41 PM.    Final
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
## 2020-03-25 NOTE — Progress Notes (Signed)
Pharmacy Antibiotic Note  Candice Jordan is a 19 y.o. female admitted on 03/24/2020 with MRSA bacteremia/endocarditis.  Pharmacy has been consulted for vancomycin dosing.  Vancomycin 1500 mg IV given at [facility] at  6 pm 4/22  Patient was given one time dose of Oritavancin on 4/18 due to potential concern of her leaving AMA.   Plan: Vancomycin IV 750 mg q12h   Height: 5\' 5"  (165.1 cm) Weight: 56.9 kg (125 lb 7.1 oz) IBW/kg (Calculated) : 57  Temp (24hrs), Avg:100.2 F (37.9 C), Min:100.2 F (37.9 C), Max:100.2 F (37.9 C)  Recent Labs  Lab 03/19/20 1941 03/19/20 2353 03/20/20 0310 03/20/20 0616 03/21/20 0338  WBC 13.0*  --   --   --  12.5*  CREATININE 0.83  --   --  0.71 0.72  LATICACIDVEN  --  2.3* 1.4  --   --     Estimated Creatinine Clearance: 100.8 mL/min (by C-G formula based on SCr of 0.72 mg/dL).    No Known Allergies  Candice Jordan, 03/23/20  03/25/2020 1:10 AM

## 2020-03-25 NOTE — Progress Notes (Signed)
Pharmacy Antibiotic Note  Candice Jordan is a 19 y.o. female admitted on 03/24/2020 with MRSA bacteremia/endocarditis.  Pharmacy has been consulted for vancomycin dosing.  Vancomycin 1500 mg IV given at [facility] at  6 pm 4/22.   1x dose of Vancomycin 750 mg given this AM.   Will change dose of Vancomycin to 1000 mg Q12H to target higher estimated AUC (~555, with Scr 0.8). Patient has excellent renal status, so actual AUC may be lower than estimated; should tolerate dose well. WBC 12.3, Tmax 100.2.  Plan: Start Vancomycin IV 1000 mg Q12H Monitor renal function, signs of clinical improvement, and levels as indicated.  Height: 5\' 5"  (165.1 cm) Weight: 56.9 kg (125 lb 7.1 oz) IBW/kg (Calculated) : 57  Temp (24hrs), Avg:99.7 F (37.6 C), Min:98.2 F (36.8 C), Max:103.1 F (39.5 C)  Recent Labs  Lab 03/19/20 1941 03/19/20 2353 03/20/20 0310 03/20/20 0616 03/21/20 0338 03/25/20 0702  WBC 13.0*  --   --   --  12.5* 12.3*  CREATININE 0.83  --   --  0.71 0.72 0.50  LATICACIDVEN  --  2.3* 1.4  --   --   --     Estimated Creatinine Clearance: 100.8 mL/min (by C-G formula based on SCr of 0.5 mg/dL).    No Known Allergies   Antimicrobials this admission: Vancomycin 4/23> Oritavancin x 1 4/18  Microbiology results: 4/17 BCx: 2/2 MRSA 4/19 BCx: NGTD 4/23 BCx: In process  Thank you for allowing pharmacy to be a part of this patient's care.  5/23  Student-PharmD 03/25/2020 1:40 PM

## 2020-03-25 NOTE — Progress Notes (Signed)
Patient received to the unit. Patient is alert and oriented. Iv in place. Vital signs are stable. Patient refused for skin check. Patient has given instructions about call bell and phone.Bed in low position and side rail up x2. Call bell in reach.
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
## 2020-03-25 NOTE — Progress Notes (Signed)
   03/25/20 1742  Assess: MEWS Score  Pulse Rate (!) 119  ECG Heart Rate (!) 119  Resp (!) 29  SpO2 90 %  Assess: MEWS Score  MEWS Temp 0  MEWS Systolic 0  MEWS Pulse 2  MEWS RR 2  MEWS LOC 0  MEWS Score 4  MEWS Score Color Red  Assess: if the MEWS score is Yellow or Red  Were vital signs taken at a resting state? Yes  Focused Assessment Documented focused assessment  Treat  MEWS Interventions Administered scheduled meds/treatments;Escalated (See documentation below)  Take Vital Signs  Increase Vital Sign Frequency  Red: Q 1hr X 4 then Q 4hr X 4, if remains red, continue Q 4hrs  Escalate  MEWS: Escalate Red: discuss with charge nurse/RN and provider, consider discussing with RRT  Notify: Charge Nurse/RN  Name of Charge Nurse/RN Notified Maralyn Sago  Date Charge Nurse/RN Notified 03/25/20  Time Charge Nurse/RN Notified 1750  Notify: Provider  Provider Name/Title Ghimire, MD'  Date Provider Notified 03/25/20  Time Provider Notified 1740  Notification Type Page  Notification Reason Change in status  Response See new orders  Date of Provider Response 03/25/20  Time of Provider Response 1745

## 2020-03-26 LAB — COMPREHENSIVE METABOLIC PANEL
ALT: 47 U/L — ABNORMAL HIGH (ref 0–44)
AST: 46 U/L — ABNORMAL HIGH (ref 15–41)
Albumin: 1.7 g/dL — ABNORMAL LOW (ref 3.5–5.0)
Alkaline Phosphatase: 101 U/L (ref 38–126)
Anion gap: 9 (ref 5–15)
BUN: 5 mg/dL — ABNORMAL LOW (ref 6–20)
CO2: 25 mmol/L (ref 22–32)
Calcium: 8 mg/dL — ABNORMAL LOW (ref 8.9–10.3)
Chloride: 100 mmol/L (ref 98–111)
Creatinine, Ser: 0.48 mg/dL (ref 0.44–1.00)
GFR calc Af Amer: >60 mL/min (ref >60)
GFR calc non Af Amer: >60 mL/min (ref >60)
Glucose, Bld: 119 mg/dL — ABNORMAL HIGH (ref 70–99)
Potassium: 3.8 mmol/L (ref 3.5–5.1)
Sodium: 134 mmol/L — ABNORMAL LOW (ref 135–145)
Total Bilirubin: 0.6 mg/dL (ref 0.3–1.2)
Total Protein: 7.3 g/dL (ref 6.5–8.1)

## 2020-03-26 LAB — CULTURE, BLOOD (ROUTINE X 2)
Culture: NO GROWTH
Culture: NO GROWTH
Special Requests: ADEQUATE
Special Requests: ADEQUATE

## 2020-03-26 LAB — CBC WITH DIFFERENTIAL/PLATELET
Abs Immature Granulocytes: 0.12 10*3/uL — ABNORMAL HIGH (ref 0.00–0.07)
Basophils Absolute: 0 10*3/uL (ref 0.0–0.1)
Basophils Relative: 0 %
Eosinophils Absolute: 0.1 10*3/uL (ref 0.0–0.5)
Eosinophils Relative: 0 %
HCT: 29.2 % — ABNORMAL LOW (ref 36.0–46.0)
Hemoglobin: 9.2 g/dL — ABNORMAL LOW (ref 12.0–15.0)
Immature Granulocytes: 1 %
Lymphocytes Relative: 21 %
Lymphs Abs: 2.5 10*3/uL (ref 0.7–4.0)
MCH: 29 pg (ref 26.0–34.0)
MCHC: 31.5 g/dL (ref 30.0–36.0)
MCV: 92.1 fL (ref 80.0–100.0)
Monocytes Absolute: 0.8 10*3/uL (ref 0.1–1.0)
Monocytes Relative: 7 %
Neutro Abs: 8.4 10*3/uL — ABNORMAL HIGH (ref 1.7–7.7)
Neutrophils Relative %: 71 %
Platelets: 292 10*3/uL (ref 150–400)
RBC: 3.17 MIL/uL — ABNORMAL LOW (ref 3.87–5.11)
RDW: 12.8 % (ref 11.5–15.5)
WBC: 11.9 10*3/uL — ABNORMAL HIGH (ref 4.0–10.5)
nRBC: 0 % (ref 0.0–0.2)

## 2020-03-26 LAB — PROCALCITONIN: Procalcitonin: 0.52 ng/mL

## 2020-03-26 LAB — FERRITIN: Ferritin: 286 ng/mL (ref 11–307)

## 2020-03-26 LAB — D-DIMER, QUANTITATIVE: D-Dimer, Quant: 8.13 ug/mL-FEU — ABNORMAL HIGH (ref 0.00–0.50)

## 2020-03-26 LAB — C-REACTIVE PROTEIN: CRP: 19.5 mg/dL — ABNORMAL HIGH (ref <1.0)

## 2020-03-26 MED ORDER — OXYCODONE HCL 5 MG PO TABS
10.0000 mg | ORAL_TABLET | Freq: Four times a day (QID) | ORAL | Status: DC | PRN
  Administered 2020-03-26: 10 mg via ORAL
  Filled 2020-03-26: qty 2

## 2020-03-26 MED ORDER — METHADONE HCL 5 MG PO TABS
10.0000 mg | ORAL_TABLET | Freq: Every day | ORAL | Status: DC
  Administered 2020-03-26: 10:00:00 10 mg via ORAL
  Filled 2020-03-26: qty 2

## 2020-03-26 MED ORDER — OXYCODONE HCL 5 MG PO TABS
5.0000 mg | ORAL_TABLET | Freq: Once | ORAL | Status: AC
  Administered 2020-03-26: 19:00:00 5 mg via ORAL
  Filled 2020-03-26: qty 1

## 2020-03-26 NOTE — Progress Notes (Addendum)
PROGRESS NOTE                                                                                                                                                                                                             Patient Demographics:    Candice Jordan, is a 19 y.o. female, DOB - Oct 27, 2001, CBJ:[id number]  Outpatient Primary MD for the patient is System, Provider Not In   Admit date - 03/24/2020   LOS - 2  No chief complaint on file.      Brief Narrative: Patient is a 19 y.o. 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  See below for further details  Significant Events: 4/23>>Readmit to [facility]-ongoing fever, malaise 4/17-4/19>> admit to [facility]-but signed out AMA 4/8>> signed out AMA from [facility] emergency room 4/7-4/8>> signed out from [facility]- admit for COVID-19 infection and MRSA bacteremia with presumed septic emboli seen on CT chest.  Significant studies 4/19>> TTE at [facility] tricuspid valve endocarditis 4/18>> lower extremity Dopplers negative for DVT 4/7>> CT angio chest at [facility] for PE, multifocal airspace disease consistent with septic emboli/multifocal pneumonia 4/7>> TTE at [facility]- EF 55-60%, no vegetations  COVID-19 medications: None  Antibiotics: Vancomycin 4/22>> Oritavancin 4/18 x 1 Vancomycin 4/17>> 4/18  Microbiology data: Blood culture: 4/23: No growth Blood culture 4/19: No growth Blood culture 4/18: MRSA Blood culture 4/8 (at [facility]): MRSA Blood culture 4/7 (at [facility]): MRSA   Consults: ID   Subjective:   Patient is somnolent in bed states she did not get a good night sleep, in no distress whatsoever, denies any headache, no chest or abdominal pain, says her legs are aching.   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  Have again explained  to patient that if she leaves the hospital AMA-she is at risk of significant mortality and morbidity.  Has also received a single dose of Oritavancin as she was threatening to leave AMA.  COVID-19 infection:  Incidental finding no symptoms continue to monitor.  Nasal positive date 03/09/2020.   Recent Labs    03/25/20 0702 03/26/20 0940  DDIMER 6.64* 8.13*  FERRITIN 268 286  LDH 242*  --   CRP 21.1* 19.5*    No  results found for: SARSCOV2NAA   Elevated D-dimer: Recent CTA chest negative-lower extremity Doppler was negative for DVT.  She remains on room air-without hypoxia and overall stability-doubt further work-up required.  Heroin withdrawal: Does not want to try Suboxone currently on methadone, since we are unable to discharge her on methadone and abrupt stoppage will cause extensive withdrawal will start tapering it off.  I think we are better off giving her detailed long-acting on with as needed short acting morphine in the acute setting.  IV drug use (heroin): Counseled-extensively-she is aware of the deleterious effects of heroin use.  Tobacco abuse: We will counsel over the next few days-continue transdermal nicotine  Transaminitis: Likely secondary to COVID-19/MRSA bacteremia-however hepatitis C antibodies positive-we will check viral load.  Hepatitis antibody positive: Pending hep C viral load .  Noncompliance/history of leaving AMA: She has been counseled extensively by previous MD in [place], has been warned that leaving AGAINST MEDICAL ADVICE could result in stroke, disability and death.  She understands the risks and assumes all responsibility if she does leave AGAINST MEDICAL ADVICE.  Somnolent morning of 03/26/2020.  Reduce Methadose and short acting narcotics.    Condition - Guarded   Family Communication  : Called mother 03/26/2020 on listed cell phone number.  No response after 20 rings.  She does not want [name] communicating with her mother (RN at bedside-surprisingly  during her last admission-she was okay with me contacting her mother-who never answered the phone).  She was okay to talk to her grandmother-including about drug use (note RN at bedside)  Code Status :  Full Code  Diet :  Diet Order            Diet Heart Room service appropriate? Yes; Fluid consistency: Thin  Diet effective now             Procedures:  TTE - 03/21/20  1. Tricuspid valve endocarditis.  2. Left ventricular ejection fraction, by estimation, is 60 to 65%. The left ventricle has normal function. The left ventricle has no regional wall motion abnormalities. Left ventricular diastolic parameters were normal.  3. Right ventricular systolic function is normal. The right ventricular size is normal. There is normal pulmonary artery systolic pressure.  4. The mitral valve is normal in structure. Mild mitral valve regurgitation. No evidence of mitral stenosis.  5. Moderately thickened tricuspid valve, with small 0.7 x 0.9 cm non mobile posterior leaflet vegetation. The tricuspid valve is abnormal. Tricuspid valve regurgitation is moderate.  6. The aortic valve is normal in structure. Aortic valve regurgitation is not visualized. No aortic stenosis is present.  7. The inferior vena cava is normal in size with greater than 50% respiratory variability, suggesting right atrial pressure of 3 mmHg.   Disposition Plan  :  Remain hospitalized  Barriers to discharge: MRSA bacteremia/heroin withdrawal/COVID-19 infection  Antimicorbials  :    Anti-infectives (From admission, onward)   Start     Dose/Rate Route Frequency Ordered Stop   03/27/20 1200  Oritavancin Diphosphate (ORBACTIV) 1,200 mg in dextrose 5 % IVPB     1,200 mg 333.3 mL/hr over 180 Minutes Intravenous Once 03/25/20 1440     03/25/20 1800  vancomycin (VANCOCIN) IVPB 1000 mg/200 mL premix     1,000 mg 200 mL/hr over 60 Minutes Intravenous Every 12 hours 03/25/20 1340 03/27/20 0900   03/25/20 0600  vancomycin  (VANCOREADY) IVPB 750 mg/150 mL  Status:  Discontinued     750 mg 150 mL/hr over 60 Minutes Intravenous  Every 12 hours 03/25/20 0114 03/25/20 1340     DVT prophylaxis: SQ Lovenox  Inpatient Medications  Scheduled Meds: . enoxaparin (LOVENOX) injection  40 mg Subcutaneous Daily  . methadone  10 mg Oral Daily  . polyethylene glycol  17 g Oral Daily   Continuous Infusions: . [START ON 03/27/2020] oritavancin (ORBACTIV) IVPB    . vancomycin 1,000 mg (03/26/20 0606)   PRN Meds:.acetaminophen **OR** [DISCONTINUED] acetaminophen, guaiFENesin-dextromethorphan, [DISCONTINUED] ondansetron **OR** ondansetron (ZOFRAN) IV, oxyCODONE, senna-docusate   Time Spent in minutes  25    See all Orders from today for further details   Susa Raring M.D on 03/26/2020 at 12:28 PM  To page go to [email or web address] - use universal password  [facility] -  Office  [phone]    Objective:   Vitals:   03/26/20 0800 03/26/20 0915 03/26/20 1149 03/26/20 1150  BP:    118/71  Pulse:   96 100  Resp:   (!) 24 20  Temp: 98.1 F (36.7 C) 97.8 F (36.6 C) 98.2 F (36.8 C)   TempSrc: Oral Oral Oral   SpO2:   94% 95%  Weight:      Height:        Wt Readings from Last 3 Encounters:  03/24/20 56.9 kg  03/20/20 54.4 kg  01/06/18 56.7 kg (52 %, Z= 0.04)*   * Growth percentiles are based on CDC (Girls, 2-20 Years) data.     Intake/Output Summary (Last 24 hours) at 03/26/2020 1228 Last data filed at 03/26/2020 0900 Gross per 24 hour  Intake 1020 ml  Output --  Net 1020 ml     Physical Exam  Somnolent but easily arousable, no focal deficits [place].AT,PERRAL Supple Neck,No JVD, No cervical lymphadenopathy appriciated.  Symmetrical Chest wall movement, Good air movement bilaterally, CTAB RRR,No Gallops, positive systolic murmur +ve B.Sounds, Abd Soft, No tenderness, No organomegaly appriciated, No rebound - guarding or rigidity. No Cyanosis, Clubbing or edema, No new Rash or bruise     Data Review:    CBC Recent Labs  Lab 03/19/20 1941 03/21/20 0338 03/25/20 0702 03/26/20 0940  WBC 13.0* 12.5* 12.3* 11.9*  HGB 9.4* 9.0* 8.2* 9.2*  HCT 28.2* 27.8* 26.2* 29.2*  PLT 383 400 242 292  MCV 89.2 90.8 91.9 92.1  MCH 29.7 29.4 28.8 29.0  MCHC 33.3 32.4 31.3 31.5  RDW 12.8 12.9 13.0 12.8  LYMPHSABS 1.8 2.6 2.9 2.5  MONOABS 0.9 1.2* 0.7 0.8  EOSABS 0.0 0.1 0.1 0.1  BASOSABS 0.0 0.1 0.0 0.0    Chemistries  Recent Labs  Lab 03/19/20 1941 03/20/20 0616 03/21/20 0338 03/25/20 0702 03/26/20 0940  NA 128* 132* 137 135 134*  K 3.4* 3.1* 3.6 4.0 3.8  CL 95* 100 104 100 100  CO2 22 24 25 26 25   GLUCOSE 127* 118* 115* 103* 119*  BUN 14 6 5* 5* 5*  CREATININE 0.83 0.71 0.72 0.50 0.48  CALCIUM 7.6* 7.4* 7.7* 7.6* 8.0*  MG  --   --  1.7  --   --   AST 64* 44* 30 44* 46*  ALT 93* 71* 57* 40 47*  ALKPHOS 138* 119 115 91 101  BILITOT 0.7 0.6 0.6 0.6 0.6   ------------------------------------------------------------------------------------------------------------------ No results for input(s): CHOL, HDL, LDLCALC, TRIG, CHOLHDL, LDLDIRECT in the last 72 hours.  No results found for: HGBA1C ------------------------------------------------------------------------------------------------------------------ No results for input(s): TSH, T4TOTAL, T3FREE, THYROIDAB in the last 72 hours.  Invalid input(s): FREET3 ------------------------------------------------------------------------------------------------------------------ Recent Labs    03/25/20 0702  03/26/20 0940  FERRITIN 268 286    Coagulation profile No results for input(s): INR, PROTIME in the last 168 hours.  Recent Labs    03/25/20 0702 03/26/20 0940  DDIMER 6.64* 8.13*    Cardiac Enzymes No results for input(s): CKMB, TROPONINI, MYOGLOBIN in the last 168 hours.  Invalid input(s): CK ------------------------------------------------------------------------------------------------------------------     Component Value Date/Time   BNP 104.3 (H) 03/25/2020 [id number]    Micro Results Recent Results (from the past 240 hour(s))  Blood culture (routine x 2)     Status: Abnormal   Collection Time: 03/19/20 11:53 PM   Specimen: BLOOD LEFT ARM  Result Value Ref Range Status   Specimen Description BLOOD LEFT ARM  Final   Special Requests   Final    BOTTLES DRAWN AEROBIC AND ANAEROBIC Blood Culture results may not be optimal due to an excessive volume of blood received in culture bottles   Culture  Setup Time   Final    GRAM POSITIVE COCCI IN CLUSTERS ANAEROBIC BOTTLE ONLY CRITICAL RESULT CALLED TO, READ BACK BY AND VERIFIED WITH: Verdia KubaHARMD T BAUMEISTER [id number] MLM Performed at [facility], [address] [place], [place] [address]    Culture METHICILLIN RESISTANT STAPHYLOCOCCUS AUREUS (A)  Final   Report Status 03/22/2020 FINAL  Final   Organism ID, Bacteria METHICILLIN RESISTANT STAPHYLOCOCCUS AUREUS  Final      Susceptibility   Methicillin resistant staphylococcus aureus - MIC*    CIPROFLOXACIN >=8 RESISTANT Resistant     ERYTHROMYCIN >=8 RESISTANT Resistant     GENTAMICIN <=0.5 SENSITIVE Sensitive     OXACILLIN >=4 RESISTANT Resistant     TETRACYCLINE <=1 SENSITIVE Sensitive     VANCOMYCIN 1 SENSITIVE Sensitive     TRIMETH/SULFA <=10 SENSITIVE Sensitive     CLINDAMYCIN <=0.25 SENSITIVE Sensitive     RIFAMPIN <=0.5 SENSITIVE Sensitive     Inducible Clindamycin NEGATIVE Sensitive     * METHICILLIN RESISTANT STAPHYLOCOCCUS AUREUS  Blood Culture ID Panel (Reflexed)     Status: Abnormal   Collection Time: 03/19/20 11:53 PM  Result Value Ref Range Status   Enterococcus species NOT DETECTED NOT DETECTED Final   Listeria monocytogenes NOT DETECTED NOT DETECTED Final   Staphylococcus species DETECTED (A) NOT DETECTED Final    Comment: CRITICAL RESULT CALLED TO, READ BACK BY AND VERIFIED WITH: PHARMD T BAUMEISTER [id number] MLM    Staphylococcus aureus (BCID) DETECTED (A) NOT  DETECTED Final    Comment: Methicillin (oxacillin)-resistant Staphylococcus aureus (MRSA). MRSA is predictably resistant to beta-lactam antibiotics (except ceftaroline). Preferred therapy is vancomycin unless clinically contraindicated. Patient requires contact precautions if  hospitalized. CRITICAL RESULT CALLED TO, READ BACK BY AND VERIFIED WITH: PHARMD T BAUMEISTER [id number] MLM    Methicillin resistance DETECTED (A) NOT DETECTED Final    Comment: CRITICAL RESULT CALLED TO, READ BACK BY AND VERIFIED WITH: PHARMD T BAUMEISTER [id number] MLM    Streptococcus species NOT DETECTED NOT DETECTED Final   Streptococcus agalactiae NOT DETECTED NOT DETECTED Final   Streptococcus pneumoniae NOT DETECTED NOT DETECTED Final   Streptococcus pyogenes NOT DETECTED NOT DETECTED Final   Acinetobacter baumannii NOT DETECTED NOT DETECTED Final   Enterobacteriaceae species NOT DETECTED NOT DETECTED Final   Enterobacter cloacae complex NOT DETECTED NOT DETECTED Final   Escherichia coli NOT DETECTED NOT DETECTED Final   Klebsiella oxytoca NOT DETECTED NOT DETECTED Final   Klebsiella pneumoniae NOT DETECTED NOT DETECTED Final   Proteus species NOT DETECTED NOT DETECTED  Final   Serratia marcescens NOT DETECTED NOT DETECTED Final   Haemophilus influenzae NOT DETECTED NOT DETECTED Final   Neisseria meningitidis NOT DETECTED NOT DETECTED Final   Pseudomonas aeruginosa NOT DETECTED NOT DETECTED Final   Candida albicans NOT DETECTED NOT DETECTED Final   Candida glabrata NOT DETECTED NOT DETECTED Final   Candida krusei NOT DETECTED NOT DETECTED Final   Candida parapsilosis NOT DETECTED NOT DETECTED Final   Candida tropicalis NOT DETECTED NOT DETECTED Final    Comment: Performed at [facility], [address] [place], [place] [address]  Blood culture (routine x 2)     Status: Abnormal   Collection Time: 03/20/20 12:30 AM   Specimen: BLOOD  Result Value Ref Range Status   Specimen Description BLOOD  RIGHT ANTECUBITAL  Final   Special Requests   Final    BOTTLES DRAWN AEROBIC AND ANAEROBIC Blood Culture adequate volume   Culture  Setup Time   Final    GRAM POSITIVE COCCI IN BOTH AEROBIC AND ANAEROBIC BOTTLES CRITICAL VALUE NOTED.  VALUE IS CONSISTENT WITH PREVIOUSLY REPORTED AND CALLED VALUE.    Culture (A)  Final    STAPHYLOCOCCUS AUREUS SUSCEPTIBILITIES PERFORMED ON PREVIOUS CULTURE WITHIN THE LAST 5 DAYS. Performed at [facility], [address], [place], [place] [address]    Report Status 03/22/2020 FINAL  Final  Culture, blood (routine x 2)     Status: None (Preliminary result)   Collection Time: 03/21/20 11:51 AM   Specimen: BLOOD  Result Value Ref Range Status   Specimen Description BLOOD BLOOD RIGHT HAND  Final   Special Requests   Final    BOTTLES DRAWN AEROBIC AND ANAEROBIC Blood Culture adequate volume   Culture   Final    NO GROWTH 4 DAYS Performed at [facility], [address] [place], [place] [address]    Report Status PENDING  Incomplete  Culture, blood (routine x 2)     Status: None (Preliminary result)   Collection Time: 03/21/20 11:51 AM   Specimen: BLOOD  Result Value Ref Range Status   Specimen Description BLOOD BLOOD RIGHT HAND  Final   Special Requests   Final    BOTTLES DRAWN AEROBIC AND ANAEROBIC Blood Culture adequate volume   Culture   Final    NO GROWTH 4 DAYS Performed at [facility], [address] [place], [place] [address]    Report Status PENDING  Incomplete  Culture, blood (routine x 2)     Status: None (Preliminary result)   Collection Time: 03/25/20  6:54 AM   Specimen: BLOOD  Result Value Ref Range Status   Specimen Description BLOOD RIGHT ANTECUBITAL  Final   Special Requests   Final    BOTTLES DRAWN AEROBIC ONLY Blood Culture adequate volume   Culture   Final    NO GROWTH < 12 HOURS Performed at [facility], [address] [place], [place] [address]    Report Status PENDING  Incomplete    Culture, blood (routine x 2)     Status: None (Preliminary result)   Collection Time: 03/25/20  6:58 AM   Specimen: BLOOD LEFT HAND  Result Value Ref Range Status   Specimen Description BLOOD LEFT HAND  Final   Special Requests   Final    BOTTLES DRAWN AEROBIC ONLY Blood Culture adequate volume   Culture   Final    NO GROWTH < 12 HOURS Performed at [facility],  [address], [place], [place] [address]    Report Status PENDING  Incomplete    Radiology Reports DG Chest Portable 1 View  Result Date: 03/20/2020 CLINICAL DATA:  COVID-19 positive, tachycardia EXAM: PORTABLE CHEST 1 VIEW COMPARISON:  Radiograph 03/10/2020 FINDINGS: There are peripheral and basilar predominant areas of mixed interstitial and consolidative airspace opacity throughout both lungs. Obscuration of the bilateral hemidiaphragms may reflect small effusions, right slightly greater than left. No pneumothorax. No convincing features of edema. The cardiomediastinal contours are unremarkable. No acute osseous or soft tissue abnormality. Telemetry leads overlie the chest. IMPRESSION: Slightly increased peripheral basilar predominant opacities likely with small effusions compatible with a multifocal infectious process. Electronically Signed   By: Kreg Shropshire M.D.   On: 03/20/2020 00:12   ECHOCARDIOGRAM COMPLETE  Result Date: 03/21/2020    ECHOCARDIOGRAM REPORT   Patient Name:   Candice Jordan Date of Exam: 03/21/2020 Medical Rec #:  [id number]     Height:       65.0 in Accession #:    [id number]    Weight:       120.0 lb Date of Birth:  11-Aug-2001    BSA:          1.592 m Patient Age:    18 years      BP:           117/69 mmHg Patient Gender: F             HR:           91 bpm. Exam Location:  Inpatient Procedure: 2D Echo Indications:    Endocarditis I38  History:        Patient has no prior history of Echocardiogram examinations.                 Signs/Symptoms:MRSA; Risk Factors:Intravenous drug abuse and                  Current Smoker.  Sonographer:    Leeroy Bock Turrentine Referring Phys: [id number] Marinda Elk  Sonographer Comments: Covid 19 positive IMPRESSIONS  1. Tricuspid valve endocarditis.  2. Left ventricular ejection fraction, by estimation, is 60 to 65%. The left ventricle has normal function. The left ventricle has no regional wall motion abnormalities. Left ventricular diastolic parameters were normal.  3. Right ventricular systolic function is normal. The right ventricular size is normal. There is normal pulmonary artery systolic pressure.  4. The mitral valve is normal in structure. Mild mitral valve regurgitation. No evidence of mitral stenosis.  5. Moderately thickened tricuspid valve, with small 0.7 x 0.9 cm non mobile posterior leaflet vegetation.. The tricuspid valve is abnormal. Tricuspid valve regurgitation is moderate.  6. The aortic valve is normal in structure. Aortic valve regurgitation is not visualized. No aortic stenosis is present.  7. The inferior vena cava is normal in size with greater than 50% respiratory variability, suggesting right atrial pressure of 3 mmHg. FINDINGS  Left Ventricle: Left ventricular ejection fraction, by estimation, is 60 to 65%. The left ventricle has normal function. The left ventricle has no regional wall motion abnormalities. The left ventricular internal cavity size was normal in size. There is  no left ventricular hypertrophy. Left ventricular diastolic parameters were normal. Right Ventricle: The right ventricular size is normal. No increase in right ventricular wall thickness. Right ventricular systolic function is normal. There is normal pulmonary artery systolic pressure. The tricuspid regurgitant velocity is 2.32 m/s, and  with an assumed right atrial pressure of 3  mmHg, the estimated right ventricular systolic pressure is 24.5 mmHg. Left Atrium: Left atrial size was normal in size. Right Atrium: Right atrial size was normal in size. Pericardium: There is no evidence  of pericardial effusion. Mitral Valve: The mitral valve is normal in structure. Normal mobility of the mitral valve leaflets. Mild mitral valve regurgitation. No evidence of mitral valve stenosis. Tricuspid Valve: Moderately thickened tricuspid valve, with small 0.7 x 0.9 cm non mobile posterior leaflet vegetation. The tricuspid valve is abnormal. Tricuspid valve regurgitation is moderate . No evidence of tricuspid stenosis. Aortic Valve: The aortic valve is normal in structure. Aortic valve regurgitation is not visualized. No aortic stenosis is present. Aortic valve mean gradient measures 5.0 mmHg. Aortic valve peak gradient measures 9.1 mmHg. Aortic valve area, by VTI measures 2.23 cm. Pulmonic Valve: The pulmonic valve was normal in structure. Pulmonic valve regurgitation is not visualized. No evidence of pulmonic stenosis. Aorta: The aortic root is normal in size and structure. Venous: The inferior vena cava is normal in size with greater than 50% respiratory variability, suggesting right atrial pressure of 3 mmHg. IAS/Shunts: No atrial level shunt detected by color flow Doppler.  LEFT VENTRICLE PLAX 2D LVIDd:         4.50 cm  Diastology LVIDs:         3.10 cm  LV e' lateral:   18.60 cm/s LV PW:         0.90 cm  LV E/e' lateral: 5.0 LV IVS:        0.90 cm  LV e' medial:    11.60 cm/s LVOT diam:     1.90 cm  LV E/e' medial:  8.0 LV SV:         66 LV SV Index:   42 LVOT Area:     2.84 cm  RIGHT VENTRICLE RV S prime:     14.40 cm/s TAPSE (M-mode): 2.9 cm LEFT ATRIUM             Index LA diam:        3.00 cm 1.88 cm/m LA Vol (A2C):   43.6 ml 27.39 ml/m LA Vol (A4C):   54.1 ml 33.98 ml/m LA Biplane Vol: 50.7 ml 31.84 ml/m  AORTIC VALVE AV Area (Vmax):    2.25 cm AV Area (Vmean):   2.28 cm AV Area (VTI):     2.23 cm AV Vmax:           151.00 cm/s AV Vmean:          110.000 cm/s AV VTI:            0.297 m AV Peak Grad:      9.1 mmHg AV Mean Grad:      5.0 mmHg LVOT Vmax:         120.00 cm/s LVOT Vmean:         88.400 cm/s LVOT VTI:          0.234 m LVOT/AV VTI ratio: 0.79  AORTA Ao Root diam: 2.70 cm MITRAL VALVE               TRICUSPID VALVE MV Area (PHT): 4.21 cm    TR Peak grad:   21.5 mmHg MV Decel Time: 180 msec    TR Vmax:        232.00 cm/s MV E velocity: 92.60 cm/s MV A velocity: 49.50 cm/s  SHUNTS MV E/A ratio:  1.87        Systemic VTI:  0.23 m  Systemic Diam: 1.90 cm Donato Schultz MD Electronically signed by Donato Schultz MD Signature Date/Time: 03/21/2020/1:09:57 PM    Final    VAS [name] LOWER EXTREMITY VENOUS (DVT)  Result Date: 03/20/2020  Lower Venous DVTStudy Indications: D dimer, Covid.  Comparison Study: 04/12/14 negative Performing Technologist: Jeb Levering RDMS, RVT  Examination Guidelines: A complete evaluation includes B-mode imaging, spectral Doppler, color Doppler, and power Doppler as needed of all accessible portions of each vessel. Bilateral testing is considered an integral part of a complete examination. Limited examinations for reoccurring indications may be performed as noted. The reflux portion of the exam is performed with the patient in reverse Trendelenburg.  +---------+---------------+---------+-----------+----------+--------------+ RIGHT    CompressibilityPhasicitySpontaneityPropertiesThrombus Aging +---------+---------------+---------+-----------+----------+--------------+ CFV      Full           Yes      Yes                                 +---------+---------------+---------+-----------+----------+--------------+ SFJ      Full                                                        +---------+---------------+---------+-----------+----------+--------------+ FV Prox  Full                                                        +---------+---------------+---------+-----------+----------+--------------+ FV Mid   Full                                                         +---------+---------------+---------+-----------+----------+--------------+ FV DistalFull                                                        +---------+---------------+---------+-----------+----------+--------------+ PFV      Full                                                        +---------+---------------+---------+-----------+----------+--------------+ POP      Full           Yes      Yes                                 +---------+---------------+---------+-----------+----------+--------------+ PTV      Full                                                        +---------+---------------+---------+-----------+----------+--------------+  PERO     Full                                                        +---------+---------------+---------+-----------+----------+--------------+   +---------+---------------+---------+-----------+----------+--------------+ LEFT     CompressibilityPhasicitySpontaneityPropertiesThrombus Aging +---------+---------------+---------+-----------+----------+--------------+ CFV      Full           Yes      Yes                                 +---------+---------------+---------+-----------+----------+--------------+ SFJ      Full                                                        +---------+---------------+---------+-----------+----------+--------------+ FV Prox  Full                                                        +---------+---------------+---------+-----------+----------+--------------+ FV Mid   Full                                                        +---------+---------------+---------+-----------+----------+--------------+ FV DistalFull                                                        +---------+---------------+---------+-----------+----------+--------------+ PFV      Full                                                         +---------+---------------+---------+-----------+----------+--------------+ POP      Full           Yes      Yes                                 +---------+---------------+---------+-----------+----------+--------------+ PTV      Full                                                        +---------+---------------+---------+-----------+----------+--------------+ PERO     Full                                                        +---------+---------------+---------+-----------+----------+--------------+  Summary: RIGHT: - There is no evidence of deep vein thrombosis in the lower extremity.  - No cystic structure found in the popliteal fossa.  LEFT: - There is no evidence of deep vein thrombosis in the lower extremity.  - No cystic structure found in the popliteal fossa.  *See table(s) above for measurements and observations. Electronically signed by Monica Martinez MD on 03/20/2020 at 3:12:41 PM.    Final

## 2020-03-26 NOTE — Progress Notes (Signed)
Spoke with pt grandmother, updated on pt

## 2020-03-26 NOTE — Progress Notes (Addendum)
Pt A&O34, breathing w/o difficulty. Pt left AMA. IV was taken out. Pt didn't wait for Hospitalist to talk with her. Pt was given a mask to wear and instructed to self quarantine and to seek medical attention for CP, SOB, Chills, severe nausea, vomiting. Pt ambulated of unit. Bodenhiemer the Hospitalist was made aware of above situation.

## 2020-03-27 NOTE — Discharge Summary (Signed)
AMA  Patient left AMA during the night time, she had been extensively counseled and warned earlier on a daily basis not to leave AMA which she has done before.  Kindly read the note below from 03/26/20.   Susa Raring M.D on 03/27/2020 at 7:08 AM  [facility]  Time < 30 minutes  Last Note Below                                                                      PROGRESS NOTE                                                                                                                                                                                                             Patient Demographics:    Candice Jordan, is a 19 y.o. female, DOB - [birth year], ZOX:[id number]  Outpatient Primary MD for the patient is System, Provider Not In   Admit date - 03/24/2020   LOS - 2  No chief complaint on file.      Brief Narrative: Patient is a 19 y.o. 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  See below for further details  Significant Events: 4/23>>Readmit to [facility]-ongoing fever, malaise 4/17-4/19>> admit to [facility]-but signed out AMA 4/8>> signed out AMA from [facility] emergency room 4/7-4/8>> signed out from [facility]- admit for COVID-19 infection and MRSA bacteremia with presumed septic emboli seen on CT chest.  Significant studies 4/19>> TTE at [facility] tricuspid valve endocarditis 4/18>> lower extremity Dopplers negative for DVT 4/7>> CT angio chest at [facility] for PE, multifocal airspace disease consistent with septic emboli/multifocal pneumonia 4/7>> TTE at [facility]- EF 55-60%, no vegetations  COVID-19 medications: None  Antibiotics: Vancomycin 4/22>> Oritavancin 4/18 x 1 Vancomycin 4/17>> 4/18  Microbiology data: Blood culture: 4/23: No growth Blood culture 4/19: No  growth Blood culture 4/18: MRSA Blood culture 4/8 (at [facility]): MRSA Blood culture 4/7 (at  [facility]): MRSA   Consults: ID   Subjective:   Patient is somnolent in bed states she did not get a good night sleep, in no distress whatsoever, denies any headache, no chest or abdominal pain, says her legs are aching.   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  Have again explained to patient that if she leaves the hospital AMA-she is at risk of significant mortality and morbidity.  Has also received a single dose of Oritavancin as she was threatening to leave AMA.  COVID-19 infection:  Incidental finding no symptoms continue to monitor.  Nasal positive date 03/09/2020.   Recent Labs    03/25/20 0702 03/26/20 0940  DDIMER 6.64* 8.13*  FERRITIN 268 286  LDH 242*  --   CRP 21.1* 19.5*    No results found for: SARSCOV2NAA   Elevated D-dimer: Recent CTA chest negative-lower extremity Doppler was negative for DVT.  She remains on room air-without hypoxia and overall stability-doubt further work-up required.  Heroin withdrawal: Does not want to try Suboxone currently on methadone, since we are unable to discharge her on methadone and abrupt stoppage will cause extensive withdrawal will start tapering it off.  I think we are better off giving her detailed long-acting on with as needed short acting morphine in the acute setting.  IV drug use (heroin): Counseled-extensively-she is aware of the deleterious effects of heroin use.  Tobacco abuse: We will counsel over the next few days-continue transdermal nicotine  Transaminitis: Likely secondary to COVID-19/MRSA bacteremia-however hepatitis C antibodies positive-we will check viral load.  Hepatitis antibody positive: Pending hep C viral load .  Noncompliance/history of leaving AMA: She has been counseled extensively by previous MD in [place], has been warned that  leaving AGAINST MEDICAL ADVICE could result in stroke, disability and death.  She understands the risks and assumes all responsibility if she does leave AGAINST MEDICAL ADVICE.  Somnolent morning of 03/26/2020.  Reduce Methadose and short acting narcotics.    Condition - Guarded   Family Communication  : Called mother 03/26/2020 on listed cell phone number.  No response after 20 rings.  She does not want [name] communicating with her mother (RN at bedside-surprisingly during her last admission-she was okay with me contacting her mother-who never answered the phone).  She was okay to talk to her grandmother-including about drug use (note RN at bedside)  Code Status :  Full Code  Diet :  Diet Order    None     Procedures:  TTE - 03/21/20  1. Tricuspid valve endocarditis.  2. Left ventricular ejection fraction, by estimation, is 60 to 65%. The left ventricle has normal function. The left ventricle has no regional wall motion abnormalities. Left ventricular diastolic parameters were normal.  3. Right ventricular systolic function is normal. The right ventricular size is normal. There is normal pulmonary artery systolic pressure.  4. The mitral valve is normal in structure. Mild mitral valve regurgitation. No evidence of mitral stenosis.  5. Moderately thickened tricuspid valve, with small 0.7 x 0.9 cm non mobile posterior leaflet vegetation. The tricuspid valve is abnormal. Tricuspid valve regurgitation is moderate.  6. The aortic valve is normal in structure. Aortic valve regurgitation is not visualized. No aortic stenosis is present.  7. The inferior vena cava is normal in size with greater than 50% respiratory variability, suggesting right atrial pressure of 3 mmHg.   Disposition Plan  :  Remain  hospitalized  Barriers to discharge: MRSA bacteremia/heroin withdrawal/COVID-19 infection  Antimicorbials  :    Anti-infectives (From admission, onward)   Start     Dose/Rate Route  Frequency Ordered Stop   03/27/20 1200  Oritavancin Diphosphate (ORBACTIV) 1,200 mg in dextrose 5 % IVPB  Status:  Discontinued     1,200 mg 333.3 mL/hr over 180 Minutes Intravenous Once 03/25/20 1440 03/27/20 0246   03/25/20 1800  vancomycin (VANCOCIN) IVPB 1000 mg/200 mL premix  Status:  Discontinued     1,000 mg 200 mL/hr over 60 Minutes Intravenous Every 12 hours 03/25/20 1340 03/27/20 0246   03/25/20 0600  vancomycin (VANCOREADY) IVPB 750 mg/150 mL  Status:  Discontinued     750 mg 150 mL/hr over 60 Minutes Intravenous Every 12 hours 03/25/20 0114 03/25/20 1340     DVT prophylaxis: SQ Lovenox  Inpatient Medications  Scheduled Meds:  Continuous Infusions:  PRN Meds:.   Time Spent in minutes  25    See all Orders from today for further details   Susa Raring M.D on 03/27/2020 at 7:08 AM  To page go to [email or web address] - use universal password  [facility] -  Office  [phone]    Objective:   Vitals:   03/26/20 1150 03/26/20 1624 03/26/20 1630 03/26/20 1957  BP: 118/71 118/72  125/75  Pulse: 100 (!) 105 (!) 103 (!) 113  Resp: 20 (!) 29 (!) 41 20  Temp:  98.3 F (36.8 C)  99.7 F (37.6 C)  TempSrc:  Oral  Oral  SpO2: 95% 96% 96%   Weight:      Height:        Wt Readings from Last 3 Encounters:  03/24/20 56.9 kg  03/20/20 54.4 kg  01/06/18 56.7 kg (52 %, Z= 0.04)*   * Growth percentiles are based on CDC (Girls, 2-20 Years) data.     Intake/Output Summary (Last 24 hours) at 03/27/2020 0708 Last data filed at 03/26/2020 1859 Gross per 24 hour  Intake 222.6 ml  Output 1000 ml  Net -777.4 ml     Physical Exam  Somnolent but easily arousable, no focal deficits [place].AT,PERRAL Supple Neck,No JVD, No cervical lymphadenopathy appriciated.  Symmetrical Chest wall movement, Good air movement bilaterally, CTAB RRR,No Gallops, positive systolic murmur +ve B.Sounds, Abd Soft, No tenderness, No organomegaly appriciated, No rebound - guarding or  rigidity. No Cyanosis, Clubbing or edema, No new Rash or bruise    Data Review:    CBC Recent Labs  Lab 03/21/20 0338 03/25/20 0702 03/26/20 0940  WBC 12.5* 12.3* 11.9*  HGB 9.0* 8.2* 9.2*  HCT 27.8* 26.2* 29.2*  PLT 400 242 292  MCV 90.8 91.9 92.1  MCH 29.4 28.8 29.0  MCHC 32.4 31.3 31.5  RDW 12.9 13.0 12.8  LYMPHSABS 2.6 2.9 2.5  MONOABS 1.2* 0.7 0.8  EOSABS 0.1 0.1 0.1  BASOSABS 0.1 0.0 0.0    Chemistries  Recent Labs  Lab 03/21/20 0338 03/25/20 0702 03/26/20 0940  NA 137 135 134*  K 3.6 4.0 3.8  CL 104 100 100  CO2 GLUCOSE 115* 103* 119*  BUN 5* 5* 5*  CREATININE 0.72 0.50 0.48  CALCIUM 7.7* 7.6* 8.0*  MG 1.7  --   --   AST 30 44* 46*  ALT 57* 40 47*  ALKPHOS 115 91 101  BILITOT 0.6 0.6 0.6   ------------------------------------------------------------------------------------------------------------------ No results for input(s): CHOL, HDL, LDLCALC, TRIG, CHOLHDL, LDLDIRECT in the last 72 hours.  No results  found for: HGBA1C ------------------------------------------------------------------------------------------------------------------ No results for input(s): TSH, T4TOTAL, T3FREE, THYROIDAB in the last 72 hours.  Invalid input(s): FREET3 ------------------------------------------------------------------------------------------------------------------ Recent Labs    03/25/20 0702 03/26/20 0940  FERRITIN 268 286    Coagulation profile No results for input(s): INR, PROTIME in the last 168 hours.  Recent Labs    03/25/20 0702 03/26/20 0940  DDIMER 6.64* 8.13*    Cardiac Enzymes No results for input(s): CKMB, TROPONINI, MYOGLOBIN in the last 168 hours.  Invalid input(s): CK ------------------------------------------------------------------------------------------------------------------    Component Value Date/Time   BNP 104.3 (H) 03/25/2020 2423    Micro Results Recent Results (from the past 240 hour(s))  Blood culture  (routine x 2)     Status: Abnormal   Collection Time: 03/19/20 11:53 PM   Specimen: BLOOD LEFT ARM  Result Value Ref Range Status   Specimen Description BLOOD LEFT ARM  Final   Special Requests   Final    BOTTLES DRAWN AEROBIC AND ANAEROBIC Blood Culture results may not be optimal due to an excessive volume of blood received in culture bottles   Culture  Setup Time   Final    GRAM POSITIVE COCCI IN CLUSTERS ANAEROBIC BOTTLE ONLY CRITICAL RESULT CALLED TO, READ BACK BY AND VERIFIED WITH: Verdia Kuba [id number] 1225 MLM Performed at [facility], [address], [place], [place] [address]    Culture METHICILLIN RESISTANT STAPHYLOCOCCUS AUREUS (A)  Final   Report Status 03/22/2020 FINAL  Final   Organism ID, Bacteria METHICILLIN RESISTANT STAPHYLOCOCCUS AUREUS  Final      Susceptibility   Methicillin resistant staphylococcus aureus - MIC*    CIPROFLOXACIN >=8 RESISTANT Resistant     ERYTHROMYCIN >=8 RESISTANT Resistant     GENTAMICIN <=0.5 SENSITIVE Sensitive     OXACILLIN >=4 RESISTANT Resistant     TETRACYCLINE <=1 SENSITIVE Sensitive     VANCOMYCIN 1 SENSITIVE Sensitive     TRIMETH/SULFA <=10 SENSITIVE Sensitive     CLINDAMYCIN <=0.25 SENSITIVE Sensitive     RIFAMPIN <=0.5 SENSITIVE Sensitive     Inducible Clindamycin NEGATIVE Sensitive     * METHICILLIN RESISTANT STAPHYLOCOCCUS AUREUS  Blood Culture ID Panel (Reflexed)     Status: Abnormal   Collection Time: 03/19/20 11:53 PM  Result Value Ref Range Status   Enterococcus species NOT DETECTED NOT DETECTED Final   Listeria monocytogenes NOT DETECTED NOT DETECTED Final   Staphylococcus species DETECTED (A) NOT DETECTED Final    Comment: CRITICAL RESULT CALLED TO, READ BACK BY AND VERIFIED WITH: PHARMD T BAUMEISTER [id number] 1225 MLM    Staphylococcus aureus (BCID) DETECTED (A) NOT DETECTED Final    Comment: Methicillin (oxacillin)-resistant Staphylococcus aureus (MRSA). MRSA is predictably resistant to beta-lactam  antibiotics (except ceftaroline). Preferred therapy is vancomycin unless clinically contraindicated. Patient requires contact precautions if  hospitalized. CRITICAL RESULT CALLED TO, READ BACK BY AND VERIFIED WITH: PHARMD T BAUMEISTER [id number] 1225 MLM    Methicillin resistance DETECTED (A) NOT DETECTED Final    Comment: CRITICAL RESULT CALLED TO, READ BACK BY AND VERIFIED WITH: PHARMD T BAUMEISTER [id number] 1225 MLM    Streptococcus species NOT DETECTED NOT DETECTED Final   Streptococcus agalactiae NOT DETECTED NOT DETECTED Final   Streptococcus pneumoniae NOT DETECTED NOT DETECTED Final   Streptococcus pyogenes NOT DETECTED NOT DETECTED Final   Acinetobacter baumannii NOT DETECTED NOT DETECTED Final   Enterobacteriaceae species NOT DETECTED NOT DETECTED Final   Enterobacter cloacae complex NOT DETECTED NOT DETECTED Final   Escherichia coli NOT DETECTED  NOT DETECTED Final   Klebsiella oxytoca NOT DETECTED NOT DETECTED Final   Klebsiella pneumoniae NOT DETECTED NOT DETECTED Final   Proteus species NOT DETECTED NOT DETECTED Final   Serratia marcescens NOT DETECTED NOT DETECTED Final   Haemophilus influenzae NOT DETECTED NOT DETECTED Final   Neisseria meningitidis NOT DETECTED NOT DETECTED Final   Pseudomonas aeruginosa NOT DETECTED NOT DETECTED Final   Candida albicans NOT DETECTED NOT DETECTED Final   Candida glabrata NOT DETECTED NOT DETECTED Final   Candida krusei NOT DETECTED NOT DETECTED Final   Candida parapsilosis NOT DETECTED NOT DETECTED Final   Candida tropicalis NOT DETECTED NOT DETECTED Final    Comment: Performed at [facility], [address], [place], [place] [address]  Blood culture (routine x 2)     Status: Abnormal   Collection Time: 03/20/20 12:30 AM   Specimen: BLOOD  Result Value Ref Range Status   Specimen Description BLOOD RIGHT ANTECUBITAL  Final   Special Requests   Final    BOTTLES DRAWN AEROBIC AND ANAEROBIC Blood Culture adequate volume   Culture   Setup Time   Final    GRAM POSITIVE COCCI IN BOTH AEROBIC AND ANAEROBIC BOTTLES CRITICAL VALUE NOTED.  VALUE IS CONSISTENT WITH PREVIOUSLY REPORTED AND CALLED VALUE.    Culture (A)  Final    STAPHYLOCOCCUS AUREUS SUSCEPTIBILITIES PERFORMED ON PREVIOUS CULTURE WITHIN THE LAST 5 DAYS. Performed at [facility], [address], [place], [place] [address]    Report Status 03/22/2020 FINAL  Final  Culture, blood (routine x 2)     Status: None   Collection Time: 03/21/20 11:51 AM   Specimen: BLOOD  Result Value Ref Range Status   Specimen Description BLOOD BLOOD RIGHT HAND  Final   Special Requests   Final    BOTTLES DRAWN AEROBIC AND ANAEROBIC Blood Culture adequate volume   Culture   Final    NO GROWTH 5 DAYS Performed at [facility], [address] [place], [place] [address]    Report Status 03/26/2020 FINAL  Final  Culture, blood (routine x 2)     Status: None   Collection Time: 03/21/20 11:51 AM   Specimen: BLOOD  Result Value Ref Range Status   Specimen Description BLOOD BLOOD RIGHT HAND  Final   Special Requests   Final    BOTTLES DRAWN AEROBIC AND ANAEROBIC Blood Culture adequate volume   Culture   Final    NO GROWTH 5 DAYS Performed at [facility], [address] [place], [place] [address]    Report Status 03/26/2020 FINAL  Final  Culture, blood (routine x 2)     Status: None (Preliminary result)   Collection Time: 03/25/20  6:54 AM   Specimen: BLOOD  Result Value Ref Range Status   Specimen Description BLOOD RIGHT ANTECUBITAL  Final   Special Requests   Final    BOTTLES DRAWN AEROBIC ONLY Blood Culture adequate volume   Culture   Final    NO GROWTH 1 DAY Performed at [facility], [address], [place], [place] [address]    Report Status PENDING  Incomplete  Culture, blood (routine x 2)     Status: None (Preliminary result)   Collection Time: 03/25/20  6:58 AM   Specimen: BLOOD LEFT HAND  Result Value Ref Range Status   Specimen  Description BLOOD LEFT HAND  Final   Special Requests   Final    BOTTLES DRAWN AEROBIC  ONLY Blood Culture adequate volume   Culture   Final    NO GROWTH 1 DAY Performed at [facility], [address], [place], [place] [address]    Report Status PENDING  Incomplete    Radiology Reports DG Chest Portable 1 View  Result Date: 03/20/2020 CLINICAL DATA:  COVID-19 positive, tachycardia EXAM: PORTABLE CHEST 1 VIEW COMPARISON:  Radiograph 03/10/2020 FINDINGS: There are peripheral and basilar predominant areas of mixed interstitial and consolidative airspace opacity throughout both lungs. Obscuration of the bilateral hemidiaphragms may reflect small effusions, right slightly greater than left. No pneumothorax. No convincing features of edema. The cardiomediastinal contours are unremarkable. No acute osseous or soft tissue abnormality. Telemetry leads overlie the chest. IMPRESSION: Slightly increased peripheral basilar predominant opacities likely with small effusions compatible with a multifocal infectious process. Electronically Signed   By: Kreg Shropshire M.D.   On: 03/20/2020 00:12   ECHOCARDIOGRAM COMPLETE  Result Date: 03/21/2020    ECHOCARDIOGRAM REPORT   Patient Name:   TELISHA ZAWADZKI Date of Exam: 03/21/2020 Medical Rec #:  [id number]     Height:       65.0 in Accession #:    [id number]    Weight:       120.0 lb Date of Birth:  2001/06/20    BSA:          1.592 m Patient Age:    18 years      BP:           117/69 mmHg Patient Gender: F             HR:           91 bpm. Exam Location:  Inpatient Procedure: 2D Echo Indications:    Endocarditis I38  History:        Patient has no prior history of Echocardiogram examinations.                 Signs/Symptoms:MRSA; Risk Factors:Intravenous drug abuse and                 Current Smoker.  Sonographer:    Leeroy Bock Turrentine Referring Phys: [id number] Marinda Elk  Sonographer Comments: Covid 19 positive IMPRESSIONS  1. Tricuspid valve endocarditis.  2.  Left ventricular ejection fraction, by estimation, is 60 to 65%. The left ventricle has normal function. The left ventricle has no regional wall motion abnormalities. Left ventricular diastolic parameters were normal.  3. Right ventricular systolic function is normal. The right ventricular size is normal. There is normal pulmonary artery systolic pressure.  4. The mitral valve is normal in structure. Mild mitral valve regurgitation. No evidence of mitral stenosis.  5. Moderately thickened tricuspid valve, with small 0.7 x 0.9 cm non mobile posterior leaflet vegetation.. The tricuspid valve is abnormal. Tricuspid valve regurgitation is moderate.  6. The aortic valve is normal in structure. Aortic valve regurgitation is not visualized. No aortic stenosis is present.  7. The inferior vena cava is normal in size with greater than 50% respiratory variability, suggesting right atrial pressure of 3 mmHg. FINDINGS  Left Ventricle: Left ventricular ejection fraction, by estimation, is 60 to 65%. The left ventricle has normal function. The left ventricle has no regional wall motion abnormalities. The left ventricular internal cavity size was normal in size. There is  no left ventricular hypertrophy. Left ventricular diastolic parameters were normal. Right Ventricle: The right ventricular size is normal. No increase in right ventricular wall thickness. Right ventricular systolic function is normal.  There is normal pulmonary artery systolic pressure. The tricuspid regurgitant velocity is 2.32 m/s, and  with an assumed right atrial pressure of 3 mmHg, the estimated right ventricular systolic pressure is 24.5 mmHg. Left Atrium: Left atrial size was normal in size. Right Atrium: Right atrial size was normal in size. Pericardium: There is no evidence of pericardial effusion. Mitral Valve: The mitral valve is normal in structure. Normal mobility of the mitral valve leaflets. Mild mitral valve regurgitation. No evidence of mitral  valve stenosis. Tricuspid Valve: Moderately thickened tricuspid valve, with small 0.7 x 0.9 cm non mobile posterior leaflet vegetation. The tricuspid valve is abnormal. Tricuspid valve regurgitation is moderate . No evidence of tricuspid stenosis. Aortic Valve: The aortic valve is normal in structure. Aortic valve regurgitation is not visualized. No aortic stenosis is present. Aortic valve mean gradient measures 5.0 mmHg. Aortic valve peak gradient measures 9.1 mmHg. Aortic valve area, by VTI measures 2.23 cm. Pulmonic Valve: The pulmonic valve was normal in structure. Pulmonic valve regurgitation is not visualized. No evidence of pulmonic stenosis. Aorta: The aortic root is normal in size and structure. Venous: The inferior vena cava is normal in size with greater than 50% respiratory variability, suggesting right atrial pressure of 3 mmHg. IAS/Shunts: No atrial level shunt detected by color flow Doppler.  LEFT VENTRICLE PLAX 2D LVIDd:         4.50 cm  Diastology LVIDs:         3.10 cm  LV e' lateral:   18.60 cm/s LV PW:         0.90 cm  LV E/e' lateral: 5.0 LV IVS:        0.90 cm  LV e' medial:    11.60 cm/s LVOT diam:     1.90 cm  LV E/e' medial:  8.0 LV SV:         66 LV SV Index:   42 LVOT Area:     2.84 cm  RIGHT VENTRICLE RV S prime:     14.40 cm/s TAPSE (M-mode): 2.9 cm LEFT ATRIUM             Index LA diam:        3.00 cm 1.88 cm/m LA Vol (A2C):   43.6 ml 27.39 ml/m LA Vol (A4C):   54.1 ml 33.98 ml/m LA Biplane Vol: 50.7 ml 31.84 ml/m  AORTIC VALVE AV Area (Vmax):    2.25 cm AV Area (Vmean):   2.28 cm AV Area (VTI):     2.23 cm AV Vmax:           151.00 cm/s AV Vmean:          110.000 cm/s AV VTI:            0.297 m AV Peak Grad:      9.1 mmHg AV Mean Grad:      5.0 mmHg LVOT Vmax:         120.00 cm/s LVOT Vmean:        88.400 cm/s LVOT VTI:          0.234 m LVOT/AV VTI ratio: 0.79  AORTA Ao Root diam: 2.70 cm MITRAL VALVE               TRICUSPID VALVE MV Area (PHT): 4.21 cm    TR Peak grad:    21.5 mmHg MV Decel Time: 180 msec    TR Vmax:        232.00 cm/s MV E velocity: 92.60 cm/s  MV A velocity: 49.50 cm/s  SHUNTS MV E/A ratio:  1.87        Systemic VTI:  0.23 m                            Systemic Diam: 1.90 cm Candee Furbish MD Electronically signed by Candee Furbish MD Signature Date/Time: 03/21/2020/1:09:57 PM    Final    VAS [name] LOWER EXTREMITY VENOUS (DVT)  Result Date: 03/20/2020  Lower Venous DVTStudy Indications: D dimer, Covid.  Comparison Study: 04/12/14 negative Performing Technologist: June Leap RDMS, RVT  Examination Guidelines: A complete evaluation includes B-mode imaging, spectral Doppler, color Doppler, and power Doppler as needed of all accessible portions of each vessel. Bilateral testing is considered an integral part of a complete examination. Limited examinations for reoccurring indications may be performed as noted. The reflux portion of the exam is performed with the patient in reverse Trendelenburg.  +---------+---------------+---------+-----------+----------+--------------+ RIGHT    CompressibilityPhasicitySpontaneityPropertiesThrombus Aging +---------+---------------+---------+-----------+----------+--------------+ CFV      Full           Yes      Yes                                 +---------+---------------+---------+-----------+----------+--------------+ SFJ      Full                                                        +---------+---------------+---------+-----------+----------+--------------+ FV Prox  Full                                                        +---------+---------------+---------+-----------+----------+--------------+ FV Mid   Full                                                        +---------+---------------+---------+-----------+----------+--------------+ FV DistalFull                                                        +---------+---------------+---------+-----------+----------+--------------+ PFV       Full                                                        +---------+---------------+---------+-----------+----------+--------------+ POP      Full           Yes      Yes                                 +---------+---------------+---------+-----------+----------+--------------+ PTV      Full                                                        +---------+---------------+---------+-----------+----------+--------------+  PERO     Full                                                        +---------+---------------+---------+-----------+----------+--------------+   +---------+---------------+---------+-----------+----------+--------------+ LEFT     CompressibilityPhasicitySpontaneityPropertiesThrombus Aging +---------+---------------+---------+-----------+----------+--------------+ CFV      Full           Yes      Yes                                 +---------+---------------+---------+-----------+----------+--------------+ SFJ      Full                                                        +---------+---------------+---------+-----------+----------+--------------+ FV Prox  Full                                                        +---------+---------------+---------+-----------+----------+--------------+ FV Mid   Full                                                        +---------+---------------+---------+-----------+----------+--------------+ FV DistalFull                                                        +---------+---------------+---------+-----------+----------+--------------+ PFV      Full                                                        +---------+---------------+---------+-----------+----------+--------------+ POP      Full           Yes      Yes                                 +---------+---------------+---------+-----------+----------+--------------+ PTV      Full                                                         +---------+---------------+---------+-----------+----------+--------------+ PERO     Full                                                        +---------+---------------+---------+-----------+----------+--------------+  Summary: RIGHT: - There is no evidence of deep vein thrombosis in the lower extremity.  - No cystic structure found in the popliteal fossa.  LEFT: - There is no evidence of deep vein thrombosis in the lower extremity.  - No cystic structure found in the popliteal fossa.  *See table(s) above for measurements and observations. Electronically signed by Monica Martinez MD on 03/20/2020 at 3:12:41 PM.    Final

## 2020-03-30 LAB — HCV RNA QUANT RFLX ULTRA OR GENOTYP
HCV RNA Qnt(log copy/mL): 3.354 log10 IU/mL
HepC Qn: 2260 IU/mL

## 2020-03-30 LAB — CULTURE, BLOOD (ROUTINE X 2)
Culture: NO GROWTH
Culture: NO GROWTH
Special Requests: ADEQUATE
Special Requests: ADEQUATE

## 2020-03-30 LAB — HEPATITIS C GENOTYPE: Hepatitis C Genotype: 3

## 2020-04-27 ENCOUNTER — Inpatient Hospital Stay (HOSPITAL_COMMUNITY): Payer: Medicaid Other

## 2020-04-27 ENCOUNTER — Inpatient Hospital Stay (HOSPITAL_COMMUNITY)
Admission: EM | Admit: 2020-04-27 | Discharge: 2020-05-26 | DRG: 871 | Disposition: A | Discharge status: Home / Self Care | Payer: Medicaid Other | Source: Other Acute Inpatient Hospital | Attending: Internal Medicine | Admitting: Internal Medicine

## 2020-04-27 DIAGNOSIS — I079 Rheumatic tricuspid valve disease, unspecified: Secondary | ICD-10-CM | POA: Diagnosis present

## 2020-04-27 DIAGNOSIS — Z8614 Personal history of Methicillin resistant Staphylococcus aureus infection: Secondary | ICD-10-CM

## 2020-04-27 DIAGNOSIS — J939 Pneumothorax, unspecified: Secondary | ICD-10-CM

## 2020-04-27 DIAGNOSIS — R6521 Severe sepsis with septic shock: Secondary | ICD-10-CM | POA: Diagnosis present

## 2020-04-27 DIAGNOSIS — D638 Anemia in other chronic diseases classified elsewhere: Secondary | ICD-10-CM | POA: Diagnosis present

## 2020-04-27 DIAGNOSIS — B1920 Unspecified viral hepatitis C without hepatic coma: Secondary | ICD-10-CM | POA: Diagnosis present

## 2020-04-27 DIAGNOSIS — I330 Acute and subacute infective endocarditis: Secondary | ICD-10-CM | POA: Diagnosis not present

## 2020-04-27 DIAGNOSIS — F1990 Other psychoactive substance use, unspecified, uncomplicated: Secondary | ICD-10-CM

## 2020-04-27 DIAGNOSIS — I2690 Septic pulmonary embolism without acute cor pulmonale: Secondary | ICD-10-CM | POA: Diagnosis present

## 2020-04-27 DIAGNOSIS — B379 Candidiasis, unspecified: Secondary | ICD-10-CM | POA: Diagnosis not present

## 2020-04-27 DIAGNOSIS — Z825 Family history of asthma and other chronic lower respiratory diseases: Secondary | ICD-10-CM | POA: Diagnosis not present

## 2020-04-27 DIAGNOSIS — L259 Unspecified contact dermatitis, unspecified cause: Secondary | ICD-10-CM | POA: Diagnosis present

## 2020-04-27 DIAGNOSIS — R768 Other specified abnormal immunological findings in serum: Secondary | ICD-10-CM | POA: Diagnosis not present

## 2020-04-27 DIAGNOSIS — F17200 Nicotine dependence, unspecified, uncomplicated: Secondary | ICD-10-CM | POA: Diagnosis present

## 2020-04-27 DIAGNOSIS — I38 Endocarditis, valve unspecified: Secondary | ICD-10-CM | POA: Diagnosis not present

## 2020-04-27 DIAGNOSIS — E876 Hypokalemia: Secondary | ICD-10-CM | POA: Diagnosis present

## 2020-04-27 DIAGNOSIS — F1120 Opioid dependence, uncomplicated: Secondary | ICD-10-CM | POA: Diagnosis present

## 2020-04-27 DIAGNOSIS — N179 Acute kidney failure, unspecified: Secondary | ICD-10-CM

## 2020-04-27 DIAGNOSIS — D649 Anemia, unspecified: Secondary | ICD-10-CM | POA: Diagnosis not present

## 2020-04-27 DIAGNOSIS — A4102 Sepsis due to Methicillin resistant Staphylococcus aureus: Secondary | ICD-10-CM | POA: Diagnosis not present

## 2020-04-27 DIAGNOSIS — F1910 Other psychoactive substance abuse, uncomplicated: Secondary | ICD-10-CM | POA: Diagnosis not present

## 2020-04-27 DIAGNOSIS — Z86711 Personal history of pulmonary embolism: Secondary | ICD-10-CM | POA: Diagnosis not present

## 2020-04-27 DIAGNOSIS — B9562 Methicillin resistant Staphylococcus aureus infection as the cause of diseases classified elsewhere: Secondary | ICD-10-CM | POA: Diagnosis present

## 2020-04-27 DIAGNOSIS — G92 Toxic encephalopathy: Secondary | ICD-10-CM | POA: Diagnosis present

## 2020-04-27 DIAGNOSIS — Z938 Other artificial opening status: Secondary | ICD-10-CM | POA: Diagnosis not present

## 2020-04-27 DIAGNOSIS — J9601 Acute respiratory failure with hypoxia: Secondary | ICD-10-CM | POA: Diagnosis present

## 2020-04-27 DIAGNOSIS — J930 Spontaneous tension pneumothorax: Secondary | ICD-10-CM | POA: Diagnosis not present

## 2020-04-27 DIAGNOSIS — R000 Tachycardia, unspecified: Secondary | ICD-10-CM

## 2020-04-27 DIAGNOSIS — A419 Sepsis, unspecified organism: Secondary | ICD-10-CM

## 2020-04-27 DIAGNOSIS — F1410 Cocaine abuse, uncomplicated: Secondary | ICD-10-CM | POA: Diagnosis present

## 2020-04-27 DIAGNOSIS — Z79899 Other long term (current) drug therapy: Secondary | ICD-10-CM | POA: Diagnosis not present

## 2020-04-27 DIAGNOSIS — J869 Pyothorax without fistula: Secondary | ICD-10-CM | POA: Diagnosis not present

## 2020-04-27 DIAGNOSIS — F1110 Opioid abuse, uncomplicated: Secondary | ICD-10-CM | POA: Diagnosis not present

## 2020-04-27 DIAGNOSIS — Z9689 Presence of other specified functional implants: Secondary | ICD-10-CM

## 2020-04-27 DIAGNOSIS — R609 Edema, unspecified: Secondary | ICD-10-CM | POA: Diagnosis not present

## 2020-04-27 DIAGNOSIS — Z8616 Personal history of COVID-19: Secondary | ICD-10-CM

## 2020-04-27 DIAGNOSIS — N898 Other specified noninflammatory disorders of vagina: Secondary | ICD-10-CM | POA: Diagnosis not present

## 2020-04-27 DIAGNOSIS — R7881 Bacteremia: Secondary | ICD-10-CM | POA: Diagnosis not present

## 2020-04-27 LAB — ECHOCARDIOGRAM LIMITED
Height: 65 in
Weight: 1735.46 oz

## 2020-04-27 LAB — URINALYSIS, ROUTINE W REFLEX MICROSCOPIC
Bilirubin Urine: NEGATIVE
Cellular Cast, UA: 3
Glucose, UA: 150 mg/dL — AB
Ketones, ur: NEGATIVE mg/dL
Nitrite: NEGATIVE
Protein, ur: 30 mg/dL — AB
RBC / HPF: >50 RBC/hpf — ABNORMAL HIGH (ref 0–5)
Specific Gravity, Urine: 1.033 — ABNORMAL HIGH (ref 1.005–1.030)
WBC, UA: >50 WBC/hpf — ABNORMAL HIGH (ref 0–5)
pH: 5 (ref 5.0–8.0)

## 2020-04-27 LAB — CBC WITH DIFFERENTIAL/PLATELET
Abs Immature Granulocytes: 0.55 10*3/uL — ABNORMAL HIGH (ref 0.00–0.07)
Basophils Absolute: 0.1 10*3/uL (ref 0.0–0.1)
Basophils Relative: 0 %
Eosinophils Absolute: 0 10*3/uL (ref 0.0–0.5)
Eosinophils Relative: 0 %
HCT: 26.8 % — ABNORMAL LOW (ref 36.0–46.0)
Hemoglobin: 8.2 g/dL — ABNORMAL LOW (ref 12.0–15.0)
Immature Granulocytes: 2 %
Lymphocytes Relative: 7 %
Lymphs Abs: 2.4 10*3/uL (ref 0.7–4.0)
MCH: 26.5 pg (ref 26.0–34.0)
MCHC: 30.6 g/dL (ref 30.0–36.0)
MCV: 86.7 fL (ref 80.0–100.0)
Monocytes Absolute: 0.6 10*3/uL (ref 0.1–1.0)
Monocytes Relative: 2 %
Neutro Abs: 28.9 10*3/uL — ABNORMAL HIGH (ref 1.7–7.7)
Neutrophils Relative %: 89 %
Platelets: 266 10*3/uL (ref 150–400)
RBC: 3.09 MIL/uL — ABNORMAL LOW (ref 3.87–5.11)
RDW: 19 % — ABNORMAL HIGH (ref 11.5–15.5)
WBC: 32.5 10*3/uL — ABNORMAL HIGH (ref 4.0–10.5)
nRBC: 0.1 % (ref 0.0–0.2)

## 2020-04-27 LAB — GLUCOSE, CAPILLARY
Glucose-Capillary: 123 mg/dL — ABNORMAL HIGH (ref 70–99)
Glucose-Capillary: 126 mg/dL — ABNORMAL HIGH (ref 70–99)
Glucose-Capillary: 135 mg/dL — ABNORMAL HIGH (ref 70–99)

## 2020-04-27 LAB — POCT I-STAT 7, (LYTES, BLD GAS, ICA,H+H)
Acid-base deficit: 3 mmol/L — ABNORMAL HIGH (ref 0.0–2.0)
Bicarbonate: 19.9 mmol/L — ABNORMAL LOW (ref 20.0–28.0)
Calcium, Ion: 1.17 mmol/L (ref 1.15–1.40)
HCT: 27 % — ABNORMAL LOW (ref 36.0–46.0)
Hemoglobin: 9.2 g/dL — ABNORMAL LOW (ref 12.0–15.0)
O2 Saturation: 94 %
Patient temperature: 98
Potassium: 3.5 mmol/L (ref 3.5–5.1)
Sodium: 134 mmol/L — ABNORMAL LOW (ref 135–145)
TCO2: 21 mmol/L — ABNORMAL LOW (ref 22–32)
pCO2 arterial: 27.2 mmHg — ABNORMAL LOW (ref 32.0–48.0)
pH, Arterial: 7.472 — ABNORMAL HIGH (ref 7.350–7.450)
pO2, Arterial: 65 mmHg — ABNORMAL LOW (ref 83.0–108.0)

## 2020-04-27 LAB — TROPONIN I (HIGH SENSITIVITY)
Troponin I (High Sensitivity): 10 ng/L (ref <18)
Troponin I (High Sensitivity): 10 ng/L (ref <18)

## 2020-04-27 LAB — HEPATITIS PANEL, ACUTE
HCV Ab: REACTIVE — AB
Hep A IgM: NONREACTIVE
Hep B C IgM: NONREACTIVE
Hepatitis B Surface Ag: NONREACTIVE

## 2020-04-27 LAB — HEMOGLOBIN A1C
Hgb A1c MFr Bld: 5.7 % — ABNORMAL HIGH (ref 4.8–5.6)
Mean Plasma Glucose: 116.89 mg/dL

## 2020-04-27 LAB — PHOSPHORUS: Phosphorus: 2.3 mg/dL — ABNORMAL LOW (ref 2.5–4.6)

## 2020-04-27 LAB — LACTIC ACID, PLASMA: Lactic Acid, Venous: 1.5 mmol/L (ref 0.5–1.9)

## 2020-04-27 LAB — MAGNESIUM: Magnesium: 1.6 mg/dL — ABNORMAL LOW (ref 1.7–2.4)

## 2020-04-27 LAB — COMPREHENSIVE METABOLIC PANEL
ALT: 102 U/L — ABNORMAL HIGH (ref 0–44)
AST: 167 U/L — ABNORMAL HIGH (ref 15–41)
Albumin: 1.8 g/dL — ABNORMAL LOW (ref 3.5–5.0)
Alkaline Phosphatase: 139 U/L — ABNORMAL HIGH (ref 38–126)
Anion gap: 10 (ref 5–15)
BUN: 18 mg/dL (ref 6–20)
CO2: 19 mmol/L — ABNORMAL LOW (ref 22–32)
Calcium: 7.8 mg/dL — ABNORMAL LOW (ref 8.9–10.3)
Chloride: 102 mmol/L (ref 98–111)
Creatinine, Ser: 0.67 mg/dL (ref 0.44–1.00)
GFR calc Af Amer: >60 mL/min (ref >60)
GFR calc non Af Amer: >60 mL/min (ref >60)
Glucose, Bld: 99 mg/dL (ref 70–99)
Potassium: 3.7 mmol/L (ref 3.5–5.1)
Sodium: 131 mmol/L — ABNORMAL LOW (ref 135–145)
Total Bilirubin: 1.4 mg/dL — ABNORMAL HIGH (ref 0.3–1.2)
Total Protein: 7.6 g/dL (ref 6.5–8.1)

## 2020-04-27 LAB — PATHOLOGIST SMEAR REVIEW: Path Review: INCREASED

## 2020-04-27 LAB — DIC (DISSEMINATED INTRAVASCULAR COAGULATION)PANEL
D-Dimer, Quant: >20 ug/mL-FEU — ABNORMAL HIGH (ref 0.00–0.50)
Fibrinogen: 288 mg/dL (ref 210–475)
INR: 1.6 — ABNORMAL HIGH (ref 0.8–1.2)
Platelets: 273 10*3/uL (ref 150–400)
Prothrombin Time: 18.3 seconds — ABNORMAL HIGH (ref 11.4–15.2)
aPTT: 34 seconds (ref 24–36)

## 2020-04-27 LAB — MRSA PCR SCREENING: MRSA by PCR: POSITIVE — AB

## 2020-04-27 MED ORDER — PANTOPRAZOLE SODIUM 40 MG IV SOLR
40.0000 mg | INTRAVENOUS | Status: DC
  Administered 2020-04-27 – 2020-04-28 (×2): 40 mg via INTRAVENOUS
  Filled 2020-04-27 (×2): qty 40

## 2020-04-27 MED ORDER — BUPRENORPHINE HCL-NALOXONE HCL 8-2 MG SL SUBL
1.0000 | SUBLINGUAL_TABLET | Freq: Two times a day (BID) | SUBLINGUAL | Status: DC
  Administered 2020-04-27: 1 via SUBLINGUAL
  Filled 2020-04-27: qty 1

## 2020-04-27 MED ORDER — ALBUMIN HUMAN 25 % IV SOLN
12.5000 g | Freq: Four times a day (QID) | INTRAVENOUS | Status: AC
  Administered 2020-04-27 (×3): 12.5 g via INTRAVENOUS
  Filled 2020-04-27 (×3): qty 50

## 2020-04-27 MED ORDER — DEXTROSE-NACL 5-0.9 % IV SOLN: INTRAVENOUS | Status: DC

## 2020-04-27 MED ORDER — DEXMEDETOMIDINE HCL IN NACL 400 MCG/100ML IV SOLN
0.4000 ug/kg/h | INTRAVENOUS | Status: DC
  Administered 2020-04-27 – 2020-04-28 (×2): 0.6 ug/kg/h via INTRAVENOUS
  Filled 2020-04-27 (×2): qty 100

## 2020-04-27 MED ORDER — SODIUM CHLORIDE 0.45 % IV SOLN: INTRAVENOUS | Status: AC

## 2020-04-27 MED ORDER — CLONIDINE HCL 0.2 MG PO TABS: 0.1000 mg | ORAL_TABLET | ORAL | Status: DC

## 2020-04-27 MED ORDER — BUPRENORPHINE HCL-NALOXONE HCL 2-0.5 MG SL SUBL: 2.0000 | SUBLINGUAL_TABLET | SUBLINGUAL | Status: DC | PRN

## 2020-04-27 MED ORDER — DOCUSATE SODIUM 100 MG PO CAPS: 100.0000 mg | ORAL_CAPSULE | Freq: Two times a day (BID) | ORAL | Status: DC | PRN

## 2020-04-27 MED ORDER — INSULIN ASPART 100 UNIT/ML ~~LOC~~ SOLN
0.0000 [IU] | SUBCUTANEOUS | Status: DC
  Administered 2020-04-27 (×2): 2 [IU] via SUBCUTANEOUS

## 2020-04-27 MED ORDER — CLONIDINE HCL 0.2 MG PO TABS
0.1000 mg | ORAL_TABLET | Freq: Four times a day (QID) | ORAL | Status: DC
  Administered 2020-04-27 – 2020-04-28 (×3): 0.1 mg via ORAL
  Filled 2020-04-27 (×4): qty 1

## 2020-04-27 MED ORDER — PIPERACILLIN-TAZOBACTAM 3.375 G IVPB
3.3750 g | Freq: Three times a day (TID) | INTRAVENOUS | Status: DC
  Administered 2020-04-27: 3.375 g via INTRAVENOUS
  Filled 2020-04-27 (×2): qty 50

## 2020-04-27 MED ORDER — POLYETHYLENE GLYCOL 3350 17 G PO PACK: 17.0000 g | PACK | Freq: Every day | ORAL | Status: DC | PRN

## 2020-04-27 MED ORDER — FENTANYL CITRATE (PF) 100 MCG/2ML IJ SOLN: 50.0000 ug | INTRAMUSCULAR | Status: DC | PRN

## 2020-04-27 MED ORDER — HALOPERIDOL LACTATE 5 MG/ML IJ SOLN
2.0000 mg | Freq: Four times a day (QID) | INTRAMUSCULAR | Status: DC | PRN
  Administered 2020-04-29 – 2020-05-03 (×4): 2 mg via INTRAVENOUS
  Filled 2020-04-27 (×6): qty 1

## 2020-04-27 MED ORDER — MAGNESIUM SULFATE 2 GM/50ML IV SOLN
2.0000 g | Freq: Once | INTRAVENOUS | Status: AC
  Administered 2020-04-27: 2 g via INTRAVENOUS
  Filled 2020-04-27: qty 50

## 2020-04-27 MED ORDER — NAPROXEN 250 MG PO TABS
500.0000 mg | ORAL_TABLET | Freq: Two times a day (BID) | ORAL | Status: AC | PRN
  Administered 2020-04-28 – 2020-04-29 (×3): 500 mg via ORAL
  Filled 2020-04-27 (×5): qty 2

## 2020-04-27 MED ORDER — VANCOMYCIN HCL IN DEXTROSE 1-5 GM/200ML-% IV SOLN
1000.0000 mg | Freq: Once | INTRAVENOUS | Status: AC
  Administered 2020-04-27: 1000 mg via INTRAVENOUS
  Filled 2020-04-27: qty 200

## 2020-04-27 MED ORDER — BUPRENORPHINE HCL-NALOXONE HCL 8-2 MG SL SUBL: 1.0000 | SUBLINGUAL_TABLET | Freq: Every day | SUBLINGUAL | Status: DC

## 2020-04-27 MED ORDER — SODIUM CHLORIDE 0.9% FLUSH
10.0000 mL | Freq: Three times a day (TID) | INTRAVENOUS | Status: DC
  Administered 2020-04-27: 10 mL

## 2020-04-27 MED ORDER — HYDROXYZINE HCL 25 MG PO TABS
25.0000 mg | ORAL_TABLET | Freq: Four times a day (QID) | ORAL | Status: AC | PRN
  Administered 2020-04-28 – 2020-05-01 (×4): 25 mg via ORAL
  Filled 2020-04-27 (×7): qty 1

## 2020-04-27 MED ORDER — BUPRENORPHINE HCL-NALOXONE HCL 8-2 MG SL SUBL
1.0000 | SUBLINGUAL_TABLET | Freq: Two times a day (BID) | SUBLINGUAL | Status: DC
  Administered 2020-04-27 – 2020-05-26 (×59): 1 via SUBLINGUAL
  Filled 2020-04-27 (×59): qty 1

## 2020-04-27 MED ORDER — SODIUM PHOSPHATES 45 MMOLE/15ML IV SOLN
20.0000 mmol | Freq: Once | INTRAVENOUS | Status: AC
  Administered 2020-04-27: 20 mmol via INTRAVENOUS
  Filled 2020-04-27: qty 6.67

## 2020-04-27 MED ORDER — LOPERAMIDE HCL 2 MG PO CAPS
2.0000 mg | ORAL_CAPSULE | ORAL | Status: AC | PRN
  Filled 2020-04-27: qty 2

## 2020-04-27 MED ORDER — CLONIDINE HCL 0.2 MG PO TABS: 0.1000 mg | ORAL_TABLET | Freq: Every day | ORAL | Status: DC

## 2020-04-27 MED ORDER — HALOPERIDOL LACTATE 5 MG/ML IJ SOLN
5.0000 mg | Freq: Once | INTRAMUSCULAR | Status: AC
  Administered 2020-04-27: 5 mg via INTRAMUSCULAR
  Filled 2020-04-27: qty 1

## 2020-04-27 MED ORDER — LACTATED RINGERS IV BOLUS
1000.0000 mL | Freq: Once | INTRAVENOUS | Status: AC
  Administered 2020-04-27: 1000 mL via INTRAVENOUS

## 2020-04-27 MED ORDER — ONDANSETRON HCL 4 MG/2ML IJ SOLN: 4.0000 mg | Freq: Four times a day (QID) | INTRAMUSCULAR | Status: DC | PRN

## 2020-04-27 MED ORDER — QUETIAPINE FUMARATE 50 MG PO TABS
50.0000 mg | ORAL_TABLET | Freq: Two times a day (BID) | ORAL | Status: DC
  Administered 2020-04-27 – 2020-05-26 (×58): 50 mg via ORAL
  Filled 2020-04-27 (×59): qty 1

## 2020-04-27 MED ORDER — HALOPERIDOL LACTATE 5 MG/ML IJ SOLN
INTRAMUSCULAR | Status: AC
  Administered 2020-04-27: 5 mg
  Filled 2020-04-27: qty 1

## 2020-04-27 MED ORDER — BUPRENORPHINE HCL-NALOXONE HCL 8-2 MG SL SUBL: 1.0000 | SUBLINGUAL_TABLET | Freq: Two times a day (BID) | SUBLINGUAL | Status: DC

## 2020-04-27 MED ORDER — METHOCARBAMOL 500 MG PO TABS
500.0000 mg | ORAL_TABLET | Freq: Three times a day (TID) | ORAL | Status: AC | PRN
  Administered 2020-05-01: 500 mg via ORAL
  Filled 2020-04-27 (×3): qty 1

## 2020-04-27 MED ORDER — DICYCLOMINE HCL 20 MG PO TABS
20.0000 mg | ORAL_TABLET | Freq: Four times a day (QID) | ORAL | Status: AC | PRN
  Filled 2020-04-27: qty 1

## 2020-04-27 MED ORDER — VANCOMYCIN HCL 500 MG/100ML IV SOLN
500.0000 mg | Freq: Three times a day (TID) | INTRAVENOUS | Status: DC
  Administered 2020-04-27 – 2020-04-29 (×7): 500 mg via INTRAVENOUS
  Filled 2020-04-27 (×9): qty 100

## 2020-04-27 MED ORDER — CHLORHEXIDINE GLUCONATE CLOTH 2 % EX PADS
6.0000 | MEDICATED_PAD | Freq: Every day | CUTANEOUS | Status: DC
  Administered 2020-04-27 – 2020-05-01 (×4): 6 via TOPICAL

## 2020-04-27 MED ORDER — OXYCODONE HCL 5 MG PO TABS: 5.0000 mg | ORAL_TABLET | ORAL | Status: DC | PRN

## 2020-04-27 NOTE — Progress Notes (Signed)
Patient more encephalopathic Sedate as needed to allow treatment She has no insight into her condition at present

## 2020-04-27 NOTE — Progress Notes (Signed)
NAME:  Candice Jordan, MRN:  [id number], DOB:  [birth year], LOS: 0 ADMISSION DATE:  04/27/2020, CONSULTATION DATE:  04/27/2020 REFERRING MD:  [facility], CHIEF COMPLAINT:  Pneumothorax   History of present illness   19 year old female presents to [facility] ED on 5/25 with reported 1 week of shortness of breath. Patient was found at [address] with oxygen saturation 79%. Reports using Heroin last night. Diagnosed with MRSA Bacteremia/Endocarditis and COVID 03/08/2020 however has left AMA numerous times throughout the month of April. On arrival to ED. CXR revealing of large right sided tension Pneumothorax. Chest Tube placed. 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 COVID-19. Polysubstance Abuse.   Significant Hospital Events   4/7-4/8 > Admit for COVID and MRSA Bacteremia, CT Chest with presumed Septic Emboli > Patient Let [facility] 4/8 4/8 > Presented to [facility] ED > Left AMA 4/17-4/19 Admitted to [facility]. Left AMA 4/23 > Admit to [facility]. Left AMA on 4/24.   Consults:    Procedures:  Right Side Chest Tube 5/25 >   Significant Diagnostic Tests:  4/7 TEE > EF 55-60, no vegetations noted 4/7 CTA > Negative PE. Multifocal Airspace Disease Consistent with Septic Emboli/Multifocal PNA 4/19 TEE > Tricuspid Valve Endocarditis   Micro Data:  Blood 5/26 >> Urine 5/26 >>  Antimicrobials:  Zosyn 5/26 >> Vancomycin 5/26 >>    Interim history/subjective:  Sitting on bedside toilet very agitated that she is unable to eat. She denies any current SOB or pain. RN reports no acute issue overnight.   Objective   Blood pressure 111/72, pulse (!) 108, temperature (!) 97.5 F (36.4 C), temperature source Oral, resp. rate (!) 25, height 5' 5"  (1.651 m), weight 49.2 kg, SpO2 99 %.        Intake/Output Summary (Last 24 hours) at 04/27/2020 0818 Last data filed at 04/27/2020 0600 Gross per 24 hour    Intake 493.16 ml  Output 94 ml  Net 399.16 ml   Filed Weights   04/27/20 0200  Weight: 49.2 kg    Examination: General: Young adult female sitting up at bedside significantly agitated, in no acute distress HEENT: /AT, multiple scattered facial scabs in varies stages of healing, MM pink/moist, PERRL,  Neuro: Alert and oriented, able to follow all commands, agitated  CV: s1s2 regular rate and rhythm, no murmur, rubs, or gallops,  PULM:  Clear to ascultation, diminished right sided, no added breath sounds, right sided pigtail chest tube to -20 suction GI: soft, bowel sounds active in all 4 quadrants, non-tender, non-distended Extremities: warm/dry, no edema  Skin: no rashes or lesions  Resolved Hospital Problem list   COVID-19 Positive 03/09/2020.   Assessment & Plan:   Septic Shock -Presumed Secondary to below as it has remained fairly untreated due to multiple admissions of leaving AMA.  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  P: Remain in ICU for close monitoring  Supplemental oxygen as needed for SpO2 greater than 92% Follow culture  Continue IV antibiotics  Aggressive IV hydration provided on admission  MAP goal < 65  Trend lactic acid Procalcitonin 0.85 > 0.52 Monitor urine output Capillary refill: May use CVP to asses fluid status Repeat ECHO   Acute Hypoxic Respiratory Distress Right Sided Tension Pneumothorax s/p Chest Tube Placement  -CTA  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, Multiple Scattered septic emboli present with small cavitary  P: Continue CT to suction Routine CT care  Flush pigtail chest tube to ensure patency  Repeat CXR intermittently  May need to repeat CT chest or obtain CD from OSH Consider CTS consult   Elevated LFT  P: Intermittently trent LFT Follow hepatis panel   IV hydration   Polysubstance Abuse, Heroin -UDS at OSH Positive Amphetamines P: Will need psych consult once appropriate  Stop Suboxone in favor or opoid utilization during acute illness  Start Clonidine taper  COWS score q4  Anemia -OSH Hemoglobin 6.9, HBG on admission to this facility 8.2 P: Trend CBC  Transfuse per protocol  Follow DIC panel   Hypomagnesemia  P: Supplement as needed  Trend   Best practice:  Diet: NPO Pain/Anxiety/Delirium protocol (if indicated): N/A VAP protocol (if indicated): N/A DVT prophylaxis: N/A GI prophylaxis: PPI Glucose control: SSI Mobility: Bedrest Code Status: Full Code  Family Communication: Will update Grandmother on plan of care  Disposition: Admit to ICU.   Labs   CBC: Recent Labs  Lab 04/27/20 0111 04/27/20 0219  WBC 32.5*  --   NEUTROABS 28.9*  --   HGB 8.2* 9.2*  HCT 26.8* 27.0*  MCV 86.7  --   PLT 273  266  --     Basic Metabolic Panel: Recent Labs  Lab 04/27/20 0111 04/27/20 0219  NA 131* 134*  K 3.7 3.5  CL 102  --   CO2 19*  --   GLUCOSE 99  --   BUN 18  --   CREATININE 0.67  --   CALCIUM 7.8*  --   MG 1.6*  --   PHOS 2.3*  --    GFR: Estimated Creatinine Clearance: 87.1 mL/min (by C-G formula based on SCr of 0.67 mg/dL). Recent Labs  Lab 04/27/20 0111  WBC 32.5*  LATICACIDVEN 1.5    Liver Function Tests: Recent Labs  Lab 04/27/20 0111  AST 167*  ALT 102*  ALKPHOS 139*  BILITOT 1.4*  PROT 7.6  ALBUMIN 1.8*   No results for input(s): LIPASE, AMYLASE in the last 168 hours. No results for input(s): AMMONIA in the last 168 hours.  ABG    Component Value Date/Time   PHART 7.472 (H) 04/27/2020 0219   PCO2ART 27.2 (L) 04/27/2020 0219   PO2ART 65 (L) 04/27/2020 0219   HCO3 19.9 (L) 04/27/2020 0219   TCO2 21 (L) 04/27/2020 0219   ACIDBASEDEF 3.0 (H) 04/27/2020 0219   O2SAT 94.0 04/27/2020 0219     Coagulation Profile: Recent Labs  Lab 04/27/20 0111  INR 1.6*    Cardiac  Enzymes: No results for input(s): CKTOTAL, CKMB, CKMBINDEX, TROPONINI in the last 168 hours.  HbA1C: Hgb A1c MFr Bld  Date/Time Value Ref Range Status  04/27/2020 02:28 AM 5.7 (H) 4.8 - 5.6 % Final    Comment:    (NOTE) Pre diabetes:          5.7%-6.4% Diabetes:              >6.4% Glycemic control for   <7.0% adults with diabetes     CBG: Recent Labs  Lab 04/27/20 0414 04/27/20 0803  GLUCAP 135* 126*    Critical care time:   Performed by: Johnsie Cancel  Total critical care time: 40 minutes  Critical care time was exclusive of separately billable procedures and treating other patients.  Critical care was necessary to treat or prevent imminent or  life-threatening deterioration.  Critical care was time spent personally by me on the following activities: development of treatment plan with patient and/or surrogate as well as nursing, discussions with consultants, evaluation of patient's response to treatment, examination of patient, obtaining history from patient or surrogate, ordering and performing treatments and interventions, ordering and review of laboratory studies, ordering and review of radiographic studies, pulse oximetry and re-evaluation of patient's condition.  Johnsie Cancel, NP-C [facility] Contact / Pager information can be found on Amion  04/27/2020, 8:40 AM
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
## 2020-04-27 NOTE — H&P (Signed)
NAME:  Candice Jordan, MRN:  [id number], DOB:  05-25-2001, LOS: 0 ADMISSION DATE:  04/27/2020, CONSULTATION DATE:  04/27/2020 REFERRING MD:  [facility], CHIEF COMPLAINT:  Pneumothorax   History of present illness   19 year old female presents to [facility] ED on 5/25 with reported 1 week of shortness of breath. Patient was found at [address] with oxygen saturation 79%. Reports using Heroin last night. Diagnosed with MRSA Bacteremia/Endocarditis and COVID 03/08/2020 however has left AMA numerous times throughout the month of April. On arrival to ED. CXR revealing of large right sided tension Pneumothorax. Chest Tube placed. 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 COVID-19. Polysubstance Abuse.   Significant Hospital Events   4/7-4/8 > Admit for COVID and MRSA Bacteremia, CT Chest with presumed Septic Emboli > Patient Let [facility] 4/8 4/8 > Presented to [facility] ED > Left AMA 4/17-4/19 Admitted to [facility]. Left AMA 4/23 > Admit to [facility]. Left AMA on 4/24.   Consults:    Procedures:  Right Side Chest Tube 5/25 >   Significant Diagnostic Tests:  4/7 TEE > EF 55-60, no vegetations noted 4/7 CTA > Negative PE. Multifocal Airspace Disease Consistent with Septic Emboli/Multifocal PNA 4/19 TEE > Tricuspid Valve Endocarditis   Micro Data:  Blood 5/26 >> Urine 5/26 >>  Antimicrobials:  Zosyn 5/26 >> Vancomycin 5/26 >>    Interim history/subjective:  As above   Objective   There were no vitals taken for this visit.       No intake or output data in the 24 hours ending 04/27/20 0058 There were no vitals filed for this visit.  Examination: General: Levering adult female, appears critically ill  HENT: Dry MM  Lungs: Coarse breath sounds, tachypnea  Cardiovascular: Tachy, no MRG Abdomen: soft, non-tender, active bowels sounds  Extremities: +2 BLE edema  Neuro: alert, oriented, follows  commands  GU: intact   Resolved Hospital Problem list     Assessment & Plan:   Septic Shock, Presumed Secondary to below as it has remained fairly untreated due to multiple admissions of leaving AMA.  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 Acute Kidney Injury  Plan  -Repeat LA and CMP pending now.  -Continue to Gently Hydrate  -Replace electrolytes as indicated   Elevated LFT  Plan  -Trend LFT -Send Viral Hepatitis Panel   Polysubstance Abuse, Heroin.  -UDS at [place] when medically stable  -Start Suboxone [place]   Anemia -OSH Hemoglobin 6.9  Plan  -Send DIC Panel  -Send Type and Screen  -Trend CBC -Transfuse for Hemoglobin <7   COVID-19 Positive 03/09/2020.  Plan  -No plans to re-test. Outside of 21 day infectious window.   Best practice:  Diet: NPO Pain/Anxiety/Delirium protocol (if indicated): N/A VAP protocol (if indicated): N/A DVT prophylaxis: N/A GI prophylaxis: PPI Glucose control: SSI Mobility: Bedrest Code Status: Full Code  Family Communication: Will update Grandmother on plan of care  Disposition: Admit to ICU.   Labs   CBC: No results for input(s): WBC, NEUTROABS, HGB, HCT, MCV, PLT in the last 168 hours.  Basic Metabolic Panel: No results for input(s): NA, K, CL, CO2, GLUCOSE, BUN, CREATININE, CALCIUM, MG, PHOS in the last 168 hours. GFR: CrCl cannot  be calculated (Patient's most recent lab result is older than the maximum 21 days allowed.). No results for input(s): PROCALCITON, WBC, LATICACIDVEN in the last 168 hours.  Liver Function Tests: No results for input(s): AST, ALT, ALKPHOS, BILITOT, PROT, ALBUMIN in the last 168 hours. No results for input(s): LIPASE, AMYLASE in the last 168 hours. No results for input(s): AMMONIA in the last 168 hours.  ABG No results found for: PHART, PCO2ART, PO2ART, HCO3, TCO2, ACIDBASEDEF, O2SAT   Coagulation Profile: No results for input(s): INR, PROTIME in the last 168 hours.  Cardiac Enzymes: No results for input(s): CKTOTAL, CKMB, CKMBINDEX, TROPONINI in the last 168 hours.  HbA1C: No results found for: HGBA1C  CBG: No results for input(s): GLUCAP in the last 168 hours.  Review of Systems:   Review of Systems  Constitutional: Positive for malaise/fatigue.  Respiratory: Positive for cough, sputum production and shortness of breath.   Cardiovascular: Positive for palpitations and leg swelling.  Gastrointestinal: Negative for abdominal pain, nausea and vomiting.    Past Medical History  She,  has a past medical history of ADHD (attention deficit hyperactivity disorder), Intravenous drug abuse (HCC), Neck muscle weakness, Nicotine dependence, cigarettes, uncomplicated, and Tibial plateau fracture, left (04/06/2014).   Surgical History    Past Surgical History:  Procedure Laterality Date  . ORIF TIBIA PLATEAU Left 04/09/2014   Procedure: OPEN REDUCTION INTERNAL FIXATION (ORIF) LEFT TIBIAL PLATEAU;  Surgeon: Sheral Apley, MD;  Location: [facility];  Service: Orthopedics;  Laterality: Left;     Social History   reports that she has been smoking. She has never used smokeless tobacco. She reports current alcohol use. She reports current drug use. Drug: Marijuana.   Family History   Her family history includes Anesthesia problems in her mother; Asthma in her maternal  grandmother; Hepatitis C in her sister.   Allergies No Known Allergies   Home Medications  Prior to Admission medications   Not on File     Critical care time: 62 minutes     Jovita Kussmaul, AGACNP-BC [facility]  PCCM Pgr: [phone]
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
## 2020-04-27 NOTE — Progress Notes (Signed)
PCCM progress note  Repeat chest x-ray after clamping of the chest tube remains stable with no recurrent pneumothorax. Therefore, decision was made to remove chest tube. This provider removed the pigtail chest tube a bedside with no acute complications. Patient tolerated procedure well. Will continue to monitor. Repeat chest xray in the AM. Bedside nurse made aware of plan of care.   Delfin Gant, NP-C [facility] Contact / Pager information can be found on Amion  04/27/2020, 5:06 PM

## 2020-04-27 NOTE — Progress Notes (Signed)
PCCM progress note   Pigtail chest tube clapped with attached clamp at approximately 1200, will assess for redevelopment of pneumothorax with repeat CXR at 1600 of not pneumothorax seen we can likely removed chest tube. If patient develops any respiratory distress including but not limited to worsening hypoxia, dyspnea, increased tachycardia or tachypnea chest tube should be unclamped and repeat CXR should be preformed earlier. Patient, patient's family, and bedside nurse updated and aware of plan.   Delfin Gant, NP-C [facility] Contact / Pager information can be found on Amion  04/27/2020, 12:06 PM

## 2020-04-27 NOTE — Progress Notes (Signed)
Chaplain responded to Spiritual Consult.  Patient was asleep but grandmother was present.  Chaplain initiated relationship of support for grandmother who is only stable influence in her granddaughter's life.  We prayed for God to send the grandmother a sign of how she could help her granddaughter get off drugs.  And we prayed that God restore the granddaughter to health.  Grandmother asked the Chaplain to let medical staff know that her granddaughter is "scared s---less" (her word) and often acts out in anger.  She want's staff to know her granddaughter has a kind heart and when she's "straight" nobody could ask for a sweet granddaughter. I assured grandmother we all care deeply for her granddaughter and it's good to know about her true nature.   Chaplain advised grandmother chaplains are on duty 24/7 and she or her granddaughter can call us to return any time there is a need.  Vernell Morgans Chaplain Resident

## 2020-04-27 NOTE — Progress Notes (Signed)
  Echocardiogram 2D Echocardiogram has been performed.  Pieter Partridge 04/27/2020, 9:15 AM

## 2020-04-27 NOTE — Progress Notes (Signed)
Pharmacy Antibiotic Note  Candice Jordan is a 19 y.o. female admitted on 04/27/2020 with sepsis.  Pharmacy has been consulted for vancomycin dosing.  Transferred from [facility] ED -received zosyn 4.5 IV g on 5/25, never received vancomycin IV. Has hx MRSA bacteremia/TV endocarditis and septic emboli to the lungs in April this year- left AMA multiple times, no IV abx as outpatient. WBC 32.5, LA 1.5. Scr 0.67 (CrCl 87 mL/min) - similar to baseline .   Plan: Zosyn 3.375g IV q8h (4 hour infusion). Vancomycin 1g IV once then 500 mg IV every 8 hours  Monitor renal fx, cx results, clinical pic, and vanc levels as appropriate     No data recorded.  No results for input(s): WBC, CREATININE, LATICACIDVEN, VANCOTROUGH, VANCOPEAK, VANCORANDOM, GENTTROUGH, GENTPEAK, GENTRANDOM, TOBRATROUGH, TOBRAPEAK, TOBRARND, AMIKACINPEAK, AMIKACINTROU, AMIKACIN in the last 168 hours.  CrCl cannot be calculated (Patient's most recent lab result is older than the maximum 21 days allowed.).    No Known Allergies  Antimicrobials this admission: Vancomycin 5/26 >>  Zosyn 5/25 (started at OSH) >>   Dose adjustments this admission: N/A  Microbiology results: 5/26 BCx: sent 5/26 UCx: sent   5/26 MRSA PCR: sent   Thank you for allowing pharmacy to be a part of this patient's care.  Sherron Monday, PharmD, BCCCP Clinical Pharmacist  04/27/2020 2:09 AM  Please check AMION for all [facility] phone numbers After 10:00 PM, call Main Pharmacy [phone]

## 2020-04-27 NOTE — Progress Notes (Signed)
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  eICU Interventions  New Patient Evaluation completed.        [name] 04/27/2020, 12:54 AM

## 2020-04-28 ENCOUNTER — Inpatient Hospital Stay (HOSPITAL_COMMUNITY): Payer: Medicaid Other

## 2020-04-28 DIAGNOSIS — R609 Edema, unspecified: Secondary | ICD-10-CM

## 2020-04-28 LAB — URINE CULTURE: Culture: NO GROWTH

## 2020-04-28 LAB — CBC
HCT: 26.1 % — ABNORMAL LOW (ref 36.0–46.0)
Hemoglobin: 7.7 g/dL — ABNORMAL LOW (ref 12.0–15.0)
MCH: 26.4 pg (ref 26.0–34.0)
MCHC: 29.5 g/dL — ABNORMAL LOW (ref 30.0–36.0)
MCV: 89.4 fL (ref 80.0–100.0)
Platelets: 222 10*3/uL (ref 150–400)
RBC: 2.92 MIL/uL — ABNORMAL LOW (ref 3.87–5.11)
RDW: 19.2 % — ABNORMAL HIGH (ref 11.5–15.5)
WBC: 17.6 10*3/uL — ABNORMAL HIGH (ref 4.0–10.5)
nRBC: 0.1 % (ref 0.0–0.2)

## 2020-04-28 LAB — BASIC METABOLIC PANEL
Anion gap: 9 (ref 5–15)
BUN: 6 mg/dL (ref 6–20)
CO2: 20 mmol/L — ABNORMAL LOW (ref 22–32)
Calcium: 8 mg/dL — ABNORMAL LOW (ref 8.9–10.3)
Chloride: 110 mmol/L (ref 98–111)
Creatinine, Ser: 0.65 mg/dL (ref 0.44–1.00)
GFR calc Af Amer: >60 mL/min (ref >60)
GFR calc non Af Amer: >60 mL/min (ref >60)
Glucose, Bld: 109 mg/dL — ABNORMAL HIGH (ref 70–99)
Potassium: 3 mmol/L — ABNORMAL LOW (ref 3.5–5.1)
Sodium: 139 mmol/L (ref 135–145)

## 2020-04-28 LAB — GLUCOSE, CAPILLARY
Glucose-Capillary: 130 mg/dL — ABNORMAL HIGH (ref 70–99)
Glucose-Capillary: 98 mg/dL (ref 70–99)

## 2020-04-28 LAB — MAGNESIUM: Magnesium: 1.9 mg/dL (ref 1.7–2.4)

## 2020-04-28 LAB — PHOSPHORUS
Phosphorus: 1.5 mg/dL — ABNORMAL LOW (ref 2.5–4.6)
Phosphorus: 2.7 mg/dL (ref 2.5–4.6)

## 2020-04-28 MED ORDER — LIP MEDEX EX OINT
TOPICAL_OINTMENT | CUTANEOUS | Status: DC | PRN
  Filled 2020-04-28: qty 7

## 2020-04-28 MED ORDER — POTASSIUM PHOSPHATES 15 MMOLE/5ML IV SOLN
45.0000 mmol | Freq: Once | INTRAVENOUS | Status: AC
  Administered 2020-04-28: 45 mmol via INTRAVENOUS
  Filled 2020-04-28: qty 15

## 2020-04-28 MED ORDER — SODIUM CHLORIDE 0.45 % IV SOLN: INTRAVENOUS | Status: DC

## 2020-04-28 MED ORDER — POTASSIUM PHOSPHATES 15 MMOLE/5ML IV SOLN: 30.0000 mmol | Freq: Once | INTRAVENOUS | Status: DC

## 2020-04-28 MED ORDER — DILTIAZEM HCL 25 MG/5ML IV SOLN
5.0000 mg | Freq: Once | INTRAVENOUS | Status: AC
  Administered 2020-04-29: 5 mg via INTRAVENOUS
  Filled 2020-04-28: qty 5

## 2020-04-28 NOTE — Progress Notes (Signed)
   04/28/20 2119  Assess: MEWS Score  Temp 98.3 F (36.8 C)  BP 111/81  Pulse Rate (!) 129  ECG Heart Rate (!) 128  Resp (!) 24  SpO2 100 %  O2 Device Nasal Cannula  O2 Flow Rate (L/min) 2 L/min  Assess: MEWS Score  MEWS Temp 0  MEWS Systolic 0  MEWS Pulse 2  MEWS RR 1  MEWS LOC 0  MEWS Score 3  MEWS Score Color Yellow  Assess: if the MEWS score is Yellow or Red  Were vital signs taken at a resting state? Yes  Focused Assessment Documented focused assessment  Early Detection of Sepsis Score *See Row Information* Low  MEWS guidelines implemented *See Row Information* Yes  Treat  MEWS Interventions Administered scheduled meds/treatments  Take Vital Signs  Increase Vital Sign Frequency  Yellow: Q 2hr X 2 then Q 4hr X 2, if remains yellow, continue Q 4hrs  Escalate  MEWS: Escalate Yellow: discuss with charge nurse/RN and consider discussing with provider and RRT  Notify: Charge Nurse/RN  Name of Charge Nurse/RN Notified Monica  Date Charge Nurse/RN Notified 04/28/20  Time Charge Nurse/RN Notified 2100

## 2020-04-28 NOTE — Progress Notes (Signed)
     [address]       [place] [address]             [phone]       Full consult to follow. 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  Images reviewed.  Small vegetation, and mostly sessile.  Like would not gain much benefit for Angiovac debridement.  Severe TR now.  Mild elevation in LFTS.  Ongoing IV drug abuse thus high risk of re-infection if she were to undergo valve replacement.  Focus should be on drug rehabilitation.  Would consult case management for assistance and funding applications.  Obtaining a TEE will not change surgical candidacy.  Medical management for now.  Romelle Reiley Keane Scrape

## 2020-04-28 NOTE — Progress Notes (Addendum)
NAME:  Candice Jordan, MRN:  [id number], DOB:  11/30/2001, LOS: 1 ADMISSION DATE:  04/27/2020, CONSULTATION DATE:  04/27/2020 REFERRING MD:  [facility], CHIEF COMPLAINT:  Pneumothorax   History of present illness   19 year old female presents to [facility] ED on 5/25 with reported 1 week of shortness of breath. Patient was found at [address] with oxygen saturation 79%. Reports using Heroin last night. Diagnosed with MRSA Bacteremia/Endocarditis and COVID 03/08/2020 however has left AMA numerous times throughout the month of April. On arrival to ED. CXR revealing of large right sided tension Pneumothorax. Chest Tube placed. 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 COVID-19. Polysubstance Abuse.   Significant Hospital Events   4/7-4/8 > Admit for COVID and MRSA Bacteremia, CT Chest with presumed Septic Emboli > Patient Let [facility] 4/8 4/8 > Presented to [facility] ED > Left AMA 4/17-4/19 Admitted to [facility]. Left AMA 4/23 > Admit to [facility]. Left AMA on 4/24. 5/26 Admitted to this facility from [facility]  Consults:  Psych   Procedures:  Right Side Chest Tube 5/25 >   Significant Diagnostic Tests:  PTA  4/7 TEE > EF 55-60, no vegetations noted 4/7 CTA > Negative PE. Multifocal Airspace Disease Consistent with Septic Emboli/Multifocal PNA 4/19 TEE > Tricuspid Valve Endocarditis   ECHO 5/26 1. There is a tricuspid valve vegetation which appears to be attached to  the posterior leaflet that measures up to 0.9 cm x 0.5 cm. It is more  apparent on this study than the last. It is best seen on subcostal views.  There is now severe tricuspid  regurgitation. Unable to exclude leaflet perforation. Given progression of  TR, TEE is recommended for better characterization. The tricuspid valve is  abnormal. Tricuspid valve regurgitation is severe.  2. Left ventricular ejection fraction, by estimation, is 60  to 65%. The  left ventricle has normal function.  3. Right ventricular systolic function is hyperdynamic. The right  ventricular size is mildly enlarged. There is mildly elevated pulmonary  artery systolic pressure. The estimated right ventricular systolic  pressure is 23.7 mmHg.  4. Right atrial size was severely dilated.  5. The mitral valve is grossly normal. Trivial mitral valve  regurgitation. No evidence of mitral stenosis.  6. The aortic valve is tricuspid. Aortic valve regurgitation is not  visualized. No aortic stenosis is present.  7. The inferior vena cava is normal in size with greater than 50%  respiratory variability, suggesting right atrial pressure of 3 mmHg.   Conclusion(s)/Recommendation(s): TV endocarditis with progression of TR  that is now severe. TEE is recommended.   Micro Data:  Blood 5/26 >> Urine 5/26 >> MRSA PCR 5/26 >> Positive   Antimicrobials:  Zosyn 5/26 >> Vancomycin 5/26 >>    Interim history/subjective:  Lying in bed in fetal position on left side, minimally interactive this morning   Objective   Blood pressure 98/65, pulse (!) 110, temperature 97.6 F (36.4 C), temperature source Axillary, resp. rate (!) 39, height 5' 5"  (1.651 m), weight 51.2 kg, SpO2 99 %.        Intake/Output Summary (Last 24 hours) at 04/28/2020 0730 Last data filed at 04/28/2020 0700 Gross per 24 hour  Intake 4224.3 ml  Output 1450 ml  Net 2774.3 ml   Filed Weights   04/27/20 0200 04/28/20 0500  Weight: 49.2 kg 51.2 kg    Examination: General: Young adult deconditioned, cachetic  female lying in bed in fetal position, in NAD HEENT: [place]/AT, MM pink/moist, PERRL, sclera non-icteric Neuro: Alert and oriented with flat affect and minimal interaction  CV: s1s2 regular rate and rhythm, no murmur, rubs, or gallops,  PULM:  Clear to ascultation bilaterally, tachypnea, oxygen saturations 94-98 on RA GI: soft, bowel sounds active in all 4 quadrants, non-tender,  non-distended Extremities: warm/dry, no edema  Skin: no rashes or lesions  Resolved Hospital Problem list   COVID-19 Positive 03/09/2020.   Assessment & Plan:   Septic Shock -Presumed Secondary to below as it has remained fairly untreated due to multiple admissions of leaving AMA.  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  -Repeat ECHO this admission reveals worsening of TV endocarditis compared to prior  P: Remain in ICU Supplemental oxygen as needed  Follow cultures  Continue IV vancomycin  MAP goal < 65  Monitor urine output Repeat ECHO as above  Consult cardiology for TEE Consulted  CTS 5/27  for possible valvular repair, request TEE prior to full evaluation   Acute Hypoxic Respiratory Distress Right Sided Tension Pneumothorax s/p Chest Tube Placement, resolved  -CTA 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, Multiple Scattered septic emboli present with small cavitary  P: CT removed afternoon of 5/27 Repeat CXR today with no recurrent PTX Supportive care  Encourage pulmonary hygiene  Elevated LFT  P: Intermittently follow LFTs IV hydration   Polysubstance Abuse, Heroin -UDS at OSH Positive Amphetamines -Psych consulted 5/26 per note " Patient currently does not have the capacity to make decisions regarding medical care and disposition, please contact next of kin or appropriate hospital administrator to help with decision-making process." P: Resume Suboxone  Continue Clonidine taper as BP will allow  COWS score q8  Anemia -OSH Hemoglobin 6.9, HBG on admission to this facility 8.2 P: Trend CBC Transfuse per protocol  Follow DIC panel   Hypomagnesemia  P: AM labs pending  Follow Bmet Supplement as needed    Best practice:  Diet: Regular Pain/Anxiety/Delirium protocol (if indicated): N/A VAP  protocol (if indicated): N/A DVT prophylaxis: SCD GI prophylaxis: PPI Glucose control: SSI Mobility: Bedrest Code Status: Full Code  Family Communication:  Grandmother updated at bedside regarding on plan of care  Disposition: ICU.   Labs   CBC: Recent Labs  Lab 04/27/20 0111 04/27/20 0219  WBC 32.5*  --   NEUTROABS 28.9*  --   HGB 8.2* 9.2*  HCT 26.8* 27.0*  MCV 86.7  --   PLT 273  266  --     Basic Metabolic Panel: Recent Labs  Lab 04/27/20 0111 04/27/20 0219  NA 131* 134*  K 3.7 3.5  CL 102  --   CO2 19*  --   GLUCOSE 99  --   BUN 18  --   CREATININE 0.67  --   CALCIUM 7.8*  --   MG 1.6*  --   PHOS 2.3*  --    GFR: Estimated Creatinine Clearance: 90.7 mL/min (by C-G formula based on SCr of 0.67 mg/dL). Recent Labs  Lab 04/27/20 0111  WBC 32.5*  LATICACIDVEN 1.5    Liver Function Tests: Recent Labs  Lab 04/27/20 0111  AST 167*  ALT 102*  ALKPHOS 139*  BILITOT 1.4*  PROT 7.6  ALBUMIN 1.8*   No results for input(s): LIPASE, AMYLASE in the last 168 hours. No results for  input(s): AMMONIA in the last 168 hours.  ABG    Component Value Date/Time   PHART 7.472 (H) 04/27/2020 0219   PCO2ART 27.2 (L) 04/27/2020 0219   PO2ART 65 (L) 04/27/2020 0219   HCO3 19.9 (L) 04/27/2020 0219   TCO2 21 (L) 04/27/2020 0219   ACIDBASEDEF 3.0 (H) 04/27/2020 0219   O2SAT 94.0 04/27/2020 0219     Coagulation Profile: Recent Labs  Lab 04/27/20 0111  INR 1.6*    Cardiac Enzymes: No results for input(s): CKTOTAL, CKMB, CKMBINDEX, TROPONINI in the last 168 hours.  HbA1C: Hgb A1c MFr Bld  Date/Time Value Ref Range Status  04/27/2020 02:28 AM 5.7 (H) 4.8 - 5.6 % Final    Comment:    (NOTE) Pre diabetes:          5.7%-6.4% Diabetes:              >6.4% Glycemic control for   <7.0% adults with diabetes     CBG: Recent Labs  Lab 04/27/20 0414 04/27/20 0803 04/27/20 2340 04/28/20 0337  GLUCAP 135* 126* 123* 130*    Critical care time:    Performed by: Johnsie Cancel  Total critical care time: 38 minutes  Critical care time was exclusive of separately billable procedures and treating other patients.  Critical care was necessary to treat or prevent imminent or life-threatening deterioration.  Critical care was time spent personally by me on the following activities: development of treatment plan with patient and/or surrogate as well as nursing, discussions with consultants, evaluation of patient's response to treatment, examination of patient, obtaining history from patient or surrogate, ordering and performing treatments and interventions, ordering and review of laboratory studies, ordering and review of radiographic studies, pulse oximetry and re-evaluation of patient's condition.  Johnsie Cancel, NP-C [facility] Contact / Pager information can be found on Amion  04/28/2020, 7:30 AM

## 2020-04-28 NOTE — Progress Notes (Signed)
Patient is breathing faster than normal, stated no difficulty breathing or pain when breathing.  HR is sustaining in the 130s at this time.  Patient sats at 99% on 2L of O2.  Notify MD on this change.

## 2020-04-28 NOTE — Progress Notes (Addendum)
[place] for Infectious Disease  Date of Admission:  04/27/2020      Total days of antibiotics 3        Day 3 - Vancomycin   ASSESSMENT: Candice Jordan is a 19 y.o. female who uses IV drugs with recent PMHx of MRSA bacteremia with TV endocarditis, new diagnosis of Hepatitis C, who presented to the ED with c/o SOB and found to have a right-sided tension pneumothorax likely secondary to ruptured septic emboli. MRSA bacteremia has been incompletely treated since 03/09/2020 with numerous interruptions. Known endocarditis since 03/19/2020 with TTE showing 0.7 cm x 0.9 cm vegetation with moderate regurg.   At this time, vegetation size has decreased however regurg is severe with possible leaflet perforation. Ultimately may require consultation from TCTS. Will obtain TEE first. Repeat blood cultures have been NGTD @ 1 day. Leukocytosis improving. Body temperature has stabilized since admission. Chest tube was able to be removed yesterday without re-accumulation of pneumothorax.   Low threshold to obtain brain imaging if acute change in mental status. If patient should develop back pain, would obtain MRI.   PLAN: 1. Continue Vancomycin  2. TEE ordered  Active Problems:   Endocarditis   Spontaneous tension pneumothorax   AKI (acute kidney injury) ([place])   Sepsis without acute organ dysfunction (HCC)   Pneumothorax   . buprenorphine-naloxone  1 tablet Sublingual BID  . Chlorhexidine Gluconate Cloth  6 each Topical Q0600  . cloNIDine  0.1 mg Oral QID   Followed by  . [START ON 04/29/2020] cloNIDine  0.1 mg Oral BH-qamhs   Followed by  . [START ON 05/01/2020] cloNIDine  0.1 mg Oral QAC breakfast  . QUEtiapine  50 mg Oral BID    SUBJECTIVE: [name] is more alert this AM. She states she is feeling very tired but denies any pain at this time, including headache, back pain, abdominal pain and chest pain. She endorses generalized weakness but states it feels equal in all  extremities. She denies SOB.   Review of Systems: Review of Systems  Constitutional: Negative for fever.  Respiratory: Negative for shortness of breath.   Cardiovascular: Negative for chest pain.  Gastrointestinal: Negative for abdominal pain, nausea and vomiting.  Musculoskeletal: Negative for back pain, joint pain, myalgias and neck pain.  Neurological: Positive for weakness. Negative for dizziness, focal weakness and headaches.   No Known Allergies  OBJECTIVE: Vitals:   04/28/20 0700 04/28/20 0800 04/28/20 0900 04/28/20 0904  BP: 98/65 103/76 115/63 115/63  Pulse:  (!) 125 (!) 114   Resp: (!) 39 (!) 58 (!) 34   Temp:  98.3 F (36.8 C)    TempSrc:  Oral    SpO2:  96% 96%   Weight:      Height:       Body mass index is 18.78 kg/m.  Physical Exam Vitals and nursing note reviewed.  Constitutional:      General: She is not in acute distress.    Appearance: She is underweight. She is ill-appearing and toxic-appearing.     Interventions: She is sedated.  Cardiovascular:     Rate and Rhythm: Regular rhythm. Tachycardia present.     Heart sounds: Murmur present. Diastolic murmur present with a grade of 2/4.  Pulmonary:     Effort: Tachypnea present. No accessory muscle usage or respiratory distress.     Breath sounds: No decreased breath sounds, wheezing, rhonchi or rales.  Abdominal:     General:  Bowel sounds are normal. There is no distension.     Palpations: Abdomen is soft.     Tenderness: There is no abdominal tenderness. There is no guarding.  Musculoskeletal:     Right lower leg: No edema.     Left lower leg: No edema.  Skin:    General: Skin is warm and dry.     Coloration: Skin is pale.     Findings: Petechiae (improving bilateral lower extremity ) present.  Neurological:     General: No focal deficit present.     Mental Status: She is lethargic.  Psychiatric:        Mood and Affect: Mood normal.        Behavior: Behavior normal. Behavior is cooperative.     Lab Results Lab Results  Component Value Date   WBC 17.6 (H) 04/28/2020   HGB 7.7 (L) 04/28/2020   HCT 26.1 (L) 04/28/2020   MCV 89.4 04/28/2020   PLT 222 04/28/2020    Lab Results  Component Value Date   CREATININE 0.65 04/28/2020   BUN 6 04/28/2020   NA 139 04/28/2020   K 3.0 (L) 04/28/2020   CL 110 04/28/2020   CO2 20 (L) 04/28/2020    Lab Results  Component Value Date   ALT 102 (H) 04/27/2020   AST 167 (H) 04/27/2020   ALKPHOS 139 (H) 04/27/2020   BILITOT 1.4 (H) 04/27/2020     Microbiology: Recent Results (from the past 240 hour(s))  MRSA PCR Screening     Status: Abnormal   Collection Time: 04/27/20  1:15 AM   Specimen: Nasopharyngeal  Result Value Ref Range Status   MRSA by PCR POSITIVE (A) NEGATIVE Final    Comment: CRITICAL RESULT CALLED TO, READ BACK BY AND VERIFIED WITH: RN [facility] MUELLER [phone] @[id number]  THANEY   Culture, blood (routine x 2)     Status: None (Preliminary result)   Collection Time: 04/27/20  1:23 AM   Specimen: BLOOD  Result Value Ref Range Status   Specimen Description BLOOD LEFT ARM  Final   Special Requests   Final    BOTTLES DRAWN AEROBIC AND ANAEROBIC Blood Culture adequate volume   Culture   Final    NO GROWTH 1 DAY Performed at [facility], [address] [place], [place]    Report Status PENDING  Incomplete  Culture, blood (routine x 2)     Status: None (Preliminary result)   Collection Time: 04/27/20  1:24 AM   Specimen: BLOOD  Result Value Ref Range Status   Specimen Description BLOOD LEFT FOREARM  Final   Special Requests   Final    BOTTLES DRAWN AEROBIC AND ANAEROBIC Blood Culture adequate volume   Culture   Final    NO GROWTH 1 DAY Performed at [facility], [address], [place], [place]    Report Status PENDING  Incomplete   Dr. [id number] Internal Medicine PGY-1   04/28/2020, 9:33 AM

## 2020-04-28 NOTE — Progress Notes (Signed)
Bilateral lower extremity venous duplex has been completed. Preliminary results can be found in CV Proc through chart review.   04/28/20 11:38 AM Olen Cordial RVT

## 2020-04-29 DIAGNOSIS — R000 Tachycardia, unspecified: Secondary | ICD-10-CM

## 2020-04-29 DIAGNOSIS — A4102 Sepsis due to Methicillin resistant Staphylococcus aureus: Principal | ICD-10-CM

## 2020-04-29 DIAGNOSIS — R7689 Other specified abnormal immunological findings in serum: Secondary | ICD-10-CM

## 2020-04-29 DIAGNOSIS — F1910 Other psychoactive substance abuse, uncomplicated: Secondary | ICD-10-CM

## 2020-04-29 DIAGNOSIS — Z938 Other artificial opening status: Secondary | ICD-10-CM

## 2020-04-29 DIAGNOSIS — R768 Other specified abnormal immunological findings in serum: Secondary | ICD-10-CM

## 2020-04-29 DIAGNOSIS — D649 Anemia, unspecified: Secondary | ICD-10-CM

## 2020-04-29 DIAGNOSIS — E876 Hypokalemia: Secondary | ICD-10-CM

## 2020-04-29 LAB — COMPREHENSIVE METABOLIC PANEL
ALT: 102 U/L — ABNORMAL HIGH (ref 0–44)
AST: 85 U/L — ABNORMAL HIGH (ref 15–41)
Albumin: 1.8 g/dL — ABNORMAL LOW (ref 3.5–5.0)
Alkaline Phosphatase: 100 U/L (ref 38–126)
Anion gap: 9 (ref 5–15)
BUN: 7 mg/dL (ref 6–20)
CO2: 18 mmol/L — ABNORMAL LOW (ref 22–32)
Calcium: 7.7 mg/dL — ABNORMAL LOW (ref 8.9–10.3)
Chloride: 113 mmol/L — ABNORMAL HIGH (ref 98–111)
Creatinine, Ser: 0.59 mg/dL (ref 0.44–1.00)
GFR calc Af Amer: >60 mL/min (ref >60)
GFR calc non Af Amer: >60 mL/min (ref >60)
Glucose, Bld: 121 mg/dL — ABNORMAL HIGH (ref 70–99)
Potassium: 2.9 mmol/L — ABNORMAL LOW (ref 3.5–5.1)
Sodium: 140 mmol/L (ref 135–145)
Total Bilirubin: 0.6 mg/dL (ref 0.3–1.2)
Total Protein: 6.5 g/dL (ref 6.5–8.1)

## 2020-04-29 LAB — DIC (DISSEMINATED INTRAVASCULAR COAGULATION)PANEL
D-Dimer, Quant: 13.08 ug/mL-FEU — ABNORMAL HIGH (ref 0.00–0.50)
Fibrinogen: 327 mg/dL (ref 210–475)
INR: 1.1 (ref 0.8–1.2)
Platelets: 267 10*3/uL (ref 150–400)
Prothrombin Time: 14.2 seconds (ref 11.4–15.2)
Smear Review: NONE SEEN
aPTT: 35 seconds (ref 24–36)

## 2020-04-29 LAB — BLOOD CULTURE ID PANEL (REFLEXED)

## 2020-04-29 LAB — PREPARE RBC (CROSSMATCH)

## 2020-04-29 LAB — MAGNESIUM: Magnesium: 1.7 mg/dL (ref 1.7–2.4)

## 2020-04-29 LAB — CBC
HCT: 24.1 % — ABNORMAL LOW (ref 36.0–46.0)
Hemoglobin: 7 g/dL — ABNORMAL LOW (ref 12.0–15.0)
MCH: 26.4 pg (ref 26.0–34.0)
MCHC: 29 g/dL — ABNORMAL LOW (ref 30.0–36.0)
MCV: 90.9 fL (ref 80.0–100.0)
Platelets: 233 10*3/uL (ref 150–400)
RBC: 2.65 MIL/uL — ABNORMAL LOW (ref 3.87–5.11)
RDW: 19.7 % — ABNORMAL HIGH (ref 11.5–15.5)
WBC: 16.8 10*3/uL — ABNORMAL HIGH (ref 4.0–10.5)
nRBC: 0.2 % (ref 0.0–0.2)

## 2020-04-29 LAB — IRON AND TIBC
Iron: 37 ug/dL (ref 28–170)
Saturation Ratios: 17 % (ref 10.4–31.8)
TIBC: 214 ug/dL — ABNORMAL LOW (ref 250–450)
UIBC: 177 ug/dL

## 2020-04-29 LAB — VITAMIN B12: Vitamin B-12: 917 pg/mL — ABNORMAL HIGH (ref 180–914)

## 2020-04-29 LAB — FERRITIN: Ferritin: 441 ng/mL — ABNORMAL HIGH (ref 11–307)

## 2020-04-29 LAB — FOLATE: Folate: 6.9 ng/mL (ref >5.9)

## 2020-04-29 LAB — VANCOMYCIN, TROUGH: Vancomycin Tr: 8 ug/mL — ABNORMAL LOW (ref 15–20)

## 2020-04-29 MED ORDER — VANCOMYCIN HCL 500 MG/100ML IV SOLN
500.0000 mg | Freq: Three times a day (TID) | INTRAVENOUS | Status: DC
  Administered 2020-04-29: 500 mg via INTRAVENOUS
  Filled 2020-04-29 (×2): qty 100

## 2020-04-29 MED ORDER — POTASSIUM CHLORIDE CRYS ER 20 MEQ PO TBCR
40.0000 meq | EXTENDED_RELEASE_TABLET | ORAL | Status: AC
  Administered 2020-04-29 (×2): 40 meq via ORAL
  Filled 2020-04-29 (×2): qty 2

## 2020-04-29 MED ORDER — MAGNESIUM SULFATE 4 GM/100ML IV SOLN
4.0000 g | Freq: Once | INTRAVENOUS | Status: AC
  Administered 2020-04-29: 4 g via INTRAVENOUS
  Filled 2020-04-29: qty 100

## 2020-04-29 MED ORDER — METOPROLOL TARTRATE 5 MG/5ML IV SOLN
5.0000 mg | INTRAVENOUS | Status: DC | PRN
  Administered 2020-04-29 – 2020-05-04 (×7): 5 mg via INTRAVENOUS
  Filled 2020-04-29 (×10): qty 5

## 2020-04-29 MED ORDER — VANCOMYCIN HCL IN DEXTROSE 1-5 GM/200ML-% IV SOLN
1000.0000 mg | Freq: Three times a day (TID) | INTRAVENOUS | Status: DC
  Administered 2020-04-30 – 2020-05-01 (×4): 1000 mg via INTRAVENOUS
  Filled 2020-04-29 (×5): qty 200

## 2020-04-29 MED ORDER — SODIUM CHLORIDE 0.9% IV SOLUTION: Freq: Once | INTRAVENOUS | Status: AC

## 2020-04-29 NOTE — Social Work (Addendum)
4:00pm- IVC has been sent from magistrate office. CSW has called [employer] to serve pt. Pt is waiting to be served by [employer].  3:23pm- Pt is in the process of being IVCed. IVC forms have been signed by doctor and faxed to the magistrate.   If pt tries to leave she should be detained.   CSW will update when complete.  Jimmy Picket, Theresia Majors, [place] Clinical Social Worker [phone]

## 2020-04-29 NOTE — Progress Notes (Signed)
OT Cancellation Note  Patient Details Name: Ramonita Koenig MRN: [id number] DOB: 17-Dec-2000   Cancelled Treatment:      Medical issues which prohibited therapy, pt with increased HR given metoprolol. RN request defer OT Evaluation until pt vitals have stabilized. OT will follow up this afternoon as able. Thank you.   Mechele Claude 04/29/2020, 9:13 AM

## 2020-04-29 NOTE — Progress Notes (Signed)
MEWS red for pulse, RR. Patient stable.  Notified Dr. Janee Morn, gave IV metoprolol as ordered with good response.  See flow sheets for further information.

## 2020-04-29 NOTE — Progress Notes (Signed)
PT Cancellation Note  Patient Details Name: Candice Jordan MRN: [id number] DOB: 15-Nov-2001   Cancelled Treatment:    Reason Eval/Treat Not Completed: (P) Medical issues which prohibited therapy Pt with increased HR given metoprolol. RN request defer Evaluation until pt vitals have stabilized. PT will follow up this afternoon as able.   Simmie Garin B. Beverely Risen PT, DPT Acute Rehabilitation Services Pager [phone] Office [phone]    Elon Alas Fleet 04/29/2020, 9:06 AM

## 2020-04-29 NOTE — Progress Notes (Signed)
PROGRESS NOTE    Candice Jordan  OAC:[id number] DOB: 11-24-2001 DOA: 04/27/2020 PCP: System, Provider Not In    No chief complaint on file.   Brief Narrative:  19 year old female presents to [facility] ED on 5/25 with reported 1 week of shortness of breath. Patient was found at [address] with oxygen saturation 79%. Reports using Heroin last night. Diagnosed with MRSA Bacteremia/Endocarditis and COVID 03/08/2020 however has left AMA numerous times throughout the month of April. On arrival to ED. CXR revealing of large right sided tension Pneumothorax. Chest Tube placed. 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  Patient met criteria for sepsis on admission with tachypnea, tachycardia, leukocytosis, concern for untreated bacteremia.  Repeat 2D echo which was done showed worsening/severe tricuspid valvular regurgitation, tricuspid valve vegetation attached to posterior leaflet measuring 0.9 cm x 0.5 cm more apparent on this study the last study.  Severely dilated right atrial size.  Hyperdynamic right ventricular systolic function.  Continue empiric IV vancomycin.  CT surgery was consulted 04/28/2020 for possible valve repair however due to ongoing IV drug abuse patient at high risk for reinfection if she were to undergo valve replacement and recommending focusing on drug  rehabilitation.  Per CT surgery obtaining a TEE will not change surgical candidacy and recommending medical management for now.  Continue IV vancomycin.  ID following.  2.  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  Chest tube removed 04/28/2020 per PCCM.  Repeat chest x-ray done showed no recurrent pneumothorax.  Pulmonary hygiene.  Supportive care.  3.  Polysubstance abuse/heroin use UDS noted at outside hospital to be positive for amphetamines. Patient noted to have multiple admissions where she is leaving AMA endocarditis.  Patient seen by psychiatry on 04/27/2020 and is felt patient lacks capacity to make an informed medical decision regarding her medical care and disposition and recommending social work evaluation to contact next of kin no appropriate hospital administrator to help with decision-making process.  Continue Suboxone.  Was on clonidine taper/detox protocol.  Patient wanting to leave AMA again today.  Patient however does state she understands the risk of leaving AMA which include death.  Was also felt by PCCM that patient lacked capacity poor insight into her current medical situation.  Patient has subsequently been IVC'd to finish out treatment for MRSA disseminated infection/endocarditis with worsening tricuspid regurgitation.  Will have psych reassess for capacity.  4.  Anemia Check an anemia panel.  Patient with no overt bleeding.  Hemoglobin currently at 7.  A unit of packed red blood cells ordered to be transfused.  DIC panel ordered.  Follow.  Transfusion threshold hemoglobin <7.  5.  Tachycardia Likely secondary to problems # 1,2,3,4.  Placed on Lopressor  as needed for heart rate greater than 130.Marland Kitchen  Continue treatment with IV antibiotics.  Patient to be transfused a unit of packed red  blood cells.  Follow.  6.  Hypomagnesemia/hypokalemia Magnesium at 1.7 this morning.  Magnesium sulfate 4 g IV x1.  K. Dur 40 mEq p.o. every 4 hours x2 doses.  7.  Positive hep C antibody/transaminitis HCV RNA quant of 2260.  Will need outpatient follow-up with ID.    DVT prophylaxis: SCDs Code Status: Full Family Communication: Updated patient.  No family at bedside. Disposition:   Status is: Inpatient    Dispo: The patient is from: Home              Anticipated d/c is to: To be determined              Anticipated d/c date is: To be determined.  Once IV antibiotics have been completed in 6 weeks.              Patient currently on IV antibiotics, with tricuspid endocarditis, being followed by ID.  Patient threatening to leave AMA and will need to be IVC.        Consultants:   ID: Dr. Baxter Flattery 04/28/2020  Psychiatry: Dr. Astrid Divine 5/26-27/2021  CT surgery: Dr. Kipp Brood  Procedures:   Chest x-ray 04/27/2020, 04/28/2020  2D echo 04/27/2020----TV endocarditis with progression of TR that is now severe.  TEE recommended.  Lower extremity Dopplers 04/28/2020  Right-sided chest tube 04/26/2020>>>> 04/28/2020  Antimicrobials:   IV Zosyn x1 dose.  IV vancomycin 04/27/2020>>>>  Micro Data:  Blood 5/26 >> Urine 5/26 >> MRSA PCR 5/26 >> Positive     Subjective: Patient sleeping but arousable.  Patient states she is going to be leaving this morning.  Patient feels she could sign out AMA.  Patient noted to be threatening to be leaving AMA early on this morning however per RN patient changed her mind.  Patient states shortness of breath is improved.  Denies any chest pain.  No abdominal pain.  Objective: Vitals:   04/29/20 1040 04/29/20 1141 04/29/20 1240 04/29/20 1601  BP: 115/64 110/62 119/75 111/84  Pulse: (!) 113 (!) 124 (!) 124 (!) 120  Resp:  (!) 32  (!) 29  Temp:  97.8 F (36.6 C)  97.6 F (36.4 C)  TempSrc:  Oral  Oral  SpO2: 95% 100% 99% 97%  Weight:        Height:        Intake/Output Summary (Last 24 hours) at 04/29/2020 1619 Last data filed at 04/29/2020 1609 Gross per 24 hour  Intake 1136.65 ml  Output --  Net 1136.65 ml   Filed Weights   04/27/20 0200 04/28/20 0500 04/28/20 2056  Weight: 49.2 kg 51.2 kg 51.8 kg    Examination:  General exam: Appears calm and comfortable  Respiratory system: Some coarse bibasilar breath sounds.  No wheezing.  Respiratory effort normal. Cardiovascular system: Tachycardic.  No JVD, no murmurs, or gallops.  No lower extremity edema.  Gastrointestinal system: Abdomen is soft, nontender, nondistended, positive bowel sounds.  No rebound.  No guarding.  Central nervous system: Alert and oriented. No focal neurological deficits. Extremities: Symmetric 5 x 5 power. Skin: No rashes, lesions or ulcers Psychiatry: Judgement and insight appear normal. Mood & affect appropriate.     Data Reviewed: I have personally reviewed following labs and imaging studies  CBC: Recent Labs  Lab 04/27/20 0111 04/27/20 0219 04/28/20 0817 04/29/20 0326 04/29/20 1422  WBC 32.5*  --  17.6* 16.8*  --   NEUTROABS 28.9*  --   --   --   --   HGB 8.2* 9.2* 7.7* 7.0*  --   HCT 26.8* 27.0* 26.1* 24.1*  --   MCV 86.7  --  89.4 90.9  --   PLT 273  266  --  222 233 160    Basic Metabolic Panel: Recent Labs  Lab 04/27/20 0111 04/27/20 0219 04/28/20 0817 04/28/20 2103 04/29/20 0326 04/29/20 0830  NA 131* 134* 139  --  140  --   K 3.7 3.5 3.0*  --  2.9*  --   CL 102  --  110  --  113*  --   CO2 19*  --  20*  --  18*  --   GLUCOSE 99  --  109*  --  121*  --   BUN 18  --  6  --  7  --   CREATININE 0.67  --  0.65  --  0.59  --   CALCIUM 7.8*  --  8.0*  --  7.7*  --   MG 1.6*  --  1.9  --   --  1.7  PHOS 2.3*  --  1.5* 2.7  --   --     GFR: Estimated Creatinine Clearance: 91.7 mL/min (by C-G formula based on SCr of 0.59 mg/dL).  Liver Function Tests: Recent Labs  Lab 04/27/20 0111 04/29/20 0326  AST 167*  85*  ALT 102* 102*  ALKPHOS 139* 100  BILITOT 1.4* 0.6  PROT 7.6 6.5  ALBUMIN 1.8* 1.8*    CBG: Recent Labs  Lab 04/27/20 0414 04/27/20 0803 04/27/20 2340 04/28/20 0337 04/28/20 0813  GLUCAP 135* 126* 123* 130* 98     Recent Results (from the past 240 hour(s))  MRSA PCR Screening     Status: Abnormal   Collection Time: 04/27/20  1:15 AM   Specimen: Nasopharyngeal  Result Value Ref Range Status   MRSA by PCR POSITIVE (A) NEGATIVE Final    Comment: CRITICAL RESULT CALLED TO, READ BACK BY AND VERIFIED WITH: RN [facility] MUELLER [id number]  THANEY   Culture, blood (routine x 2)     Status: Abnormal (Preliminary result)   Collection Time: 04/27/20  1:23 AM   Specimen: BLOOD  Result Value Ref Range Status   Specimen Description BLOOD LEFT ARM  Final   Special Requests   Final    BOTTLES DRAWN AEROBIC AND ANAEROBIC Blood Culture adequate volume   Culture  Setup Time   Final    AEROBIC BOTTLE ONLY GRAM POSITIVE COCCI IN CLUSTERS CRITICAL RESULT CALLED TO, READ BACK BY AND VERIFIED WITH: K AMEND PHARMD 04/29/20 0117 JDW    Culture (A)  Final    STAPHYLOCOCCUS AUREUS SUSCEPTIBILITIES TO FOLLOW Performed at [facility], [place] [address] [place], [place] [address]    Report Status PENDING  Incomplete  Blood Culture ID Panel (Reflexed)     Status: Abnormal   Collection Time: 04/27/20  1:23 AM  Result Value Ref Range Status   Enterococcus species NOT DETECTED NOT DETECTED Final   Listeria monocytogenes NOT DETECTED NOT DETECTED Final   Staphylococcus species DETECTED (A) NOT DETECTED Final    Comment: CRITICAL RESULT CALLED TO, READ BACK BY AND VERIFIED WITH: K AMEND PHARMD 04/29/20 0117 JDW    Staphylococcus aureus (BCID) DETECTED (A) NOT DETECTED Final    Comment: Methicillin (oxacillin)-resistant Staphylococcus aureus (MRSA). MRSA is predictably resistant to beta-lactam antibiotics (except  ceftaroline). Preferred therapy is vancomycin unless clinically  contraindicated. Patient requires contact precautions if  hospitalized. CRITICAL RESULT CALLED TO, READ BACK BY AND VERIFIED WITH: K AMEND [facility] 04/29/20 0117 JDW    Methicillin resistance DETECTED (A) NOT DETECTED Final    Comment: CRITICAL RESULT CALLED TO, READ BACK BY AND VERIFIED WITH: K AMEND PHARMD 04/29/20 0117    Streptococcus species NOT DETECTED NOT DETECTED Final   Streptococcus agalactiae NOT DETECTED NOT DETECTED Final   Streptococcus pneumoniae NOT DETECTED NOT DETECTED Final   Streptococcus pyogenes NOT DETECTED NOT DETECTED Final   Acinetobacter baumannii NOT DETECTED NOT DETECTED Final   Enterobacteriaceae species NOT DETECTED NOT DETECTED Final   Enterobacter cloacae complex NOT DETECTED NOT DETECTED Final   Escherichia coli NOT DETECTED NOT DETECTED Final   Klebsiella oxytoca NOT DETECTED NOT DETECTED Final   Klebsiella pneumoniae NOT DETECTED NOT DETECTED Final   Proteus species NOT DETECTED NOT DETECTED Final   Serratia marcescens NOT DETECTED NOT DETECTED Final   Haemophilus influenzae NOT DETECTED NOT DETECTED Final   Neisseria meningitidis NOT DETECTED NOT DETECTED Final   Pseudomonas aeruginosa NOT DETECTED NOT DETECTED Final   Candida albicans NOT DETECTED NOT DETECTED Final   Candida glabrata NOT DETECTED NOT DETECTED Final   Candida krusei NOT DETECTED NOT DETECTED Final   Candida parapsilosis NOT DETECTED NOT DETECTED Final   Candida tropicalis NOT DETECTED NOT DETECTED Final    Comment: Performed at [facility], [address], [place], [place] [address]  Culture, blood (routine x 2)     Status: Abnormal (Preliminary result)   Collection Time: 04/27/20  1:24 AM   Specimen: BLOOD  Result Value Ref Range Status   Specimen Description BLOOD LEFT FOREARM  Final   Special Requests   Final    BOTTLES DRAWN AEROBIC AND ANAEROBIC Blood Culture adequate volume   Culture  Setup Time   Final    GRAM POSITIVE COCCI AEROBIC BOTTLE ONLY CRITICAL VALUE  NOTED.  VALUE IS CONSISTENT WITH PREVIOUSLY REPORTED AND CALLED VALUE. Performed at [facility], [place] [address] [place], [place] [address]    Culture STAPHYLOCOCCUS AUREUS (A)  Final   Report Status PENDING  Incomplete  Culture, Urine     Status: None   Collection Time: 04/27/20  3:36 PM   Specimen: Urine, Random  Result Value Ref Range Status   Specimen Description URINE, RANDOM  Final   Special Requests NONE  Final   Culture   Final    NO GROWTH Performed at [facility], [place] [address] [place],  [address]    Report Status 04/28/2020 FINAL  Final         Radiology Studies: DG CHEST PORT 1 VIEW  Result Date: 04/28/2020 CLINICAL DATA:  Cough and shortness of breath.  Chest tube removal. EXAM: PORTABLE CHEST 1 VIEW COMPARISON:  04/27/2020 FINDINGS: Right pleural drain is been removed in the interval. No evidence for pneumothorax. The diffuse airspace opacity in the mid and lower right lung is similar to prior. Lateral pleural thickening/fluid evident. Vascular congestion noted bilaterally with left base patchy consolidative opacity. IMPRESSION: Interval removal of right chest tube without substantial residual pneumothorax. Diffuse right and patchy left airspace disease consistent with known pneumonia. Electronically Signed   By: Misty Stanley M.D.   On: 04/28/2020 08:31   VAS [name] LOWER EXTREMITY VENOUS (DVT)  Result Date: 04/28/2020  Lower Venous DVTStudy Indications: Edema.  Risk Factors: None identified. Comparison Study:  03/20/2020 - Negative for DVT. Performing Technologist: Oliver Hum RVT  Examination Guidelines: A complete evaluation includes B-mode imaging, spectral Doppler, color Doppler, and power Doppler as needed of all accessible portions of each vessel. Bilateral testing is considered an integral part of a complete examination. Limited examinations for reoccurring indications may be performed as noted. The reflux portion of the exam is performed with  the patient in reverse Trendelenburg.  +---------+---------------+---------+-----------+----------+--------------+ RIGHT    CompressibilityPhasicitySpontaneityPropertiesThrombus Aging +---------+---------------+---------+-----------+----------+--------------+ CFV      Full           Yes      Yes                                 +---------+---------------+---------+-----------+----------+--------------+ SFJ      Full                                                        +---------+---------------+---------+-----------+----------+--------------+ FV Prox  Full                                                        +---------+---------------+---------+-----------+----------+--------------+ FV Mid   Full                                                        +---------+---------------+---------+-----------+----------+--------------+ FV DistalFull                                                        +---------+---------------+---------+-----------+----------+--------------+ PFV      Full                                                        +---------+---------------+---------+-----------+----------+--------------+ POP      Full           Yes      Yes                                 +---------+---------------+---------+-----------+----------+--------------+ PTV      Full                                                        +---------+---------------+---------+-----------+----------+--------------+ PERO     Full                                                        +---------+---------------+---------+-----------+----------+--------------+   +---------+---------------+---------+-----------+----------+--------------+  LEFT     CompressibilityPhasicitySpontaneityPropertiesThrombus Aging +---------+---------------+---------+-----------+----------+--------------+ CFV      Full           Yes      Yes                                  +---------+---------------+---------+-----------+----------+--------------+ SFJ      Full                                                        +---------+---------------+---------+-----------+----------+--------------+ FV Prox  Full                                                        +---------+---------------+---------+-----------+----------+--------------+ FV Mid   Full                                                        +---------+---------------+---------+-----------+----------+--------------+ FV DistalFull                                                        +---------+---------------+---------+-----------+----------+--------------+ PFV      Full                                                        +---------+---------------+---------+-----------+----------+--------------+ POP      Full           Yes      Yes                                 +---------+---------------+---------+-----------+----------+--------------+ PTV      Full                                                        +---------+---------------+---------+-----------+----------+--------------+ PERO     Full                                                        +---------+---------------+---------+-----------+----------+--------------+     Summary: RIGHT: - There is no evidence of deep vein thrombosis in the lower extremity.  - No cystic structure found in the popliteal fossa.  LEFT: - There is no evidence of deep vein thrombosis in the lower extremity.  - No   cystic structure found in the popliteal fossa.  *See table(s) above for measurements and observations. Electronically signed by Servando Snare MD on 04/28/2020 at 3:39:39 PM.    Final         Scheduled Meds: . sodium chloride   Intravenous Once  . buprenorphine-naloxone  1 tablet Sublingual BID  . Chlorhexidine Gluconate Cloth  6 each Topical Q0600  . QUEtiapine  50 mg Oral BID   Continuous Infusions: .  dexmedetomidine (PRECEDEX) IV infusion Stopped (04/28/20 0725)  . magnesium sulfate bolus IVPB    . vancomycin 500 mg (04/29/20 1520)     LOS: 2 days    Time spent: 40 minutes    Irine Seal, MD [facility]   To contact the attending provider between 7A-7P or the covering provider during after hours 7P-7A, please log into the web site [email or web address] and access using universal [place] password for that web site. If you do not have the password, please call the hospital operator.  04/29/2020, 4:19 PM

## 2020-04-29 NOTE — Progress Notes (Addendum)
[facility]  Date of Admission:  04/27/2020      Total days of antibiotics 4        Day 4 - Vancomycin   ASSESSMENT: Candice Jordan is a 19 y.o. female who uses IV drugs with recent PMHx of MRSA bacteremia with TV endocarditis, new diagnosis of Hepatitis C, who presented to the ED with c/o SOB and found to have a right-sided tension pneumothorax likely secondary to ruptured septic emboli. MRSA bacteremia has been incompletely treated since 03/09/2020 with numerous interruptions. Known endocarditis since 03/19/2020 with initial TTE showing 0.7 cm x 0.9 cm vegetation with moderate regurg. At this time, vegetation size has decreased however regurg is severe with possible leaflet perforation. TCTS has assessed;no surgical intervention planned at this time.   Patient remains afebrile however quite tachycardic with tachypnea. Leukocytosis improving. Will obtain repeat cultures today.   Of note, hemoglobin continues to decrease today to 7.0. Part of her tachycardia and tachypnea may be due to this, so will transfuse 1 unit and monitor for response. Her anemia may be reactive to severe infection but hemolysis is high on the differential as well. Initial DIC panel showed schistocytes. Will re-check DIC panel for interval change.   PLAN: 1. Continue Vancomycin 2. Low threshold to obtain CT head if acute change in mental status. If patient should develop back pain, would obtain MRI 3. 1 unit of pRBCs 4. DIC panel ordered  Active Problems:   Endocarditis   Spontaneous tension pneumothorax   AKI (acute kidney injury) (HCC)   Sepsis without acute organ dysfunction (HCC)   Pneumothorax   . sodium chloride   Intravenous Once  . buprenorphine-naloxone  1 tablet Sublingual BID  . Chlorhexidine Gluconate Cloth  6 each Topical Q0600  . QUEtiapine  50 mg Oral BID    SUBJECTIVE: Candice Jordan states she feels okay this afternoon; she denies any pain at this time,  nausea, vomiting, diarrhea.   She agrees with move forward with transfusion. She wishes to leave at this time despite severe illness and high risk of morbidity/mortality.   Review of Systems: Review of Systems  Constitutional: Negative for fever.  Respiratory: Positive for shortness of breath.   Cardiovascular: Negative for chest pain.  Gastrointestinal: Negative for abdominal pain, nausea and vomiting.  Musculoskeletal: Negative for back pain, joint pain, myalgias and neck pain.   No Known Allergies  OBJECTIVE: Vitals:   04/29/20 0940 04/29/20 1040 04/29/20 1141 04/29/20 1240  BP: 107/60 115/64 110/62 119/75  Pulse: (!) 115 (!) 113 (!) 124 (!) 124  Resp:   (!) 32   Temp:   97.8 F (36.6 C)   TempSrc:   Oral   SpO2: 96% 95% 100% 99%  Weight:      Height:       Body mass index is 18.99 kg/m.  Physical Exam Vitals and nursing note reviewed.  Constitutional:      General: She is not in acute distress.    Appearance: She is underweight. She is ill-appearing and toxic-appearing.  HENT:     Head: Normocephalic and atraumatic.     Mouth/Throat:     Mouth: Mucous membranes are dry.     Pharynx: Oropharynx is clear.  Pulmonary:     Effort: Tachypnea and accessory muscle usage present. No respiratory distress.  Skin:    Coloration: Skin is pale.  Neurological:     General: No focal deficit present.  Mental Status: She is alert.  Psychiatric:        Mood and Affect: Mood is anxious.        Speech: Speech normal.        Judgment: Judgment is impulsive and inappropriate.    Lab Results Lab Results  Component Value Date   WBC 16.8 (H) 04/29/2020   HGB 7.0 (L) 04/29/2020   HCT 24.1 (L) 04/29/2020   MCV 90.9 04/29/2020   PLT 233 04/29/2020    Lab Results  Component Value Date   CREATININE 0.59 04/29/2020   BUN 7 04/29/2020   NA 140 04/29/2020   K 2.9 (L) 04/29/2020   CL 113 (H) 04/29/2020   CO2 18 (L) 04/29/2020    Lab Results  Component Value Date   ALT  102 (H) 04/29/2020   AST 85 (H) 04/29/2020   ALKPHOS 100 04/29/2020   BILITOT 0.6 04/29/2020     Microbiology: Recent Results (from the past 240 hour(s))  MRSA PCR Screening     Status: Abnormal   Collection Time: 04/27/20  1:15 AM   Specimen: Nasopharyngeal  Result Value Ref Range Status   MRSA by PCR POSITIVE (A) NEGATIVE Final    Comment: CRITICAL RESULT CALLED TO, READ BACK BY AND VERIFIED WITH: RN [facility] MUELLER [id number] @0335  THANEY   Culture, blood (routine x 2)     Status: Abnormal (Preliminary result)   Collection Time: 04/27/20  1:23 AM   Specimen: BLOOD  Result Value Ref Range Status   Specimen Description BLOOD LEFT ARM  Final   Special Requests   Final    BOTTLES DRAWN AEROBIC AND ANAEROBIC Blood Culture adequate volume   Culture  Setup Time   Final    AEROBIC BOTTLE ONLY GRAM POSITIVE COCCI IN CLUSTERS CRITICAL RESULT CALLED TO, READ BACK BY AND VERIFIED WITH: K AMEND PHARMD 04/29/20 0117 JDW    Culture (A)  Final    STAPHYLOCOCCUS AUREUS SUSCEPTIBILITIES TO FOLLOW Performed at [facility], [address], [place], [place]    Report Status PENDING  Incomplete  Blood Culture ID Panel (Reflexed)     Status: Abnormal   Collection Time: 04/27/20  1:23 AM  Result Value Ref Range Status   Enterococcus species NOT DETECTED NOT DETECTED Final   Listeria monocytogenes NOT DETECTED NOT DETECTED Final   Staphylococcus species DETECTED (A) NOT DETECTED Final    Comment: CRITICAL RESULT CALLED TO, READ BACK BY AND VERIFIED WITH: K AMEND PHARMD 04/29/20 0117 JDW    Staphylococcus aureus (BCID) DETECTED (A) NOT DETECTED Final    Comment: Methicillin (oxacillin)-resistant Staphylococcus aureus (MRSA). MRSA is predictably resistant to beta-lactam antibiotics (except ceftaroline). Preferred therapy is vancomycin unless clinically contraindicated. Patient requires contact precautions if  hospitalized. CRITICAL RESULT CALLED TO, READ BACK BY AND VERIFIED WITH: K  AMEND [facility] 04/29/20 0117 JDW    Methicillin resistance DETECTED (A) NOT DETECTED Final    Comment: CRITICAL RESULT CALLED TO, READ BACK BY AND VERIFIED WITH: K AMEND PHARMD 04/29/20 0117    Streptococcus species NOT DETECTED NOT DETECTED Final   Streptococcus agalactiae NOT DETECTED NOT DETECTED Final   Streptococcus pneumoniae NOT DETECTED NOT DETECTED Final   Streptococcus pyogenes NOT DETECTED NOT DETECTED Final   Acinetobacter baumannii NOT DETECTED NOT DETECTED Final   Enterobacteriaceae species NOT DETECTED NOT DETECTED Final   Enterobacter cloacae complex NOT DETECTED NOT DETECTED Final   Escherichia coli NOT DETECTED NOT DETECTED Final   Klebsiella oxytoca NOT DETECTED  NOT DETECTED Final   Klebsiella pneumoniae NOT DETECTED NOT DETECTED Final   Proteus species NOT DETECTED NOT DETECTED Final   Serratia marcescens NOT DETECTED NOT DETECTED Final   Haemophilus influenzae NOT DETECTED NOT DETECTED Final   Neisseria meningitidis NOT DETECTED NOT DETECTED Final   Pseudomonas aeruginosa NOT DETECTED NOT DETECTED Final   Candida albicans NOT DETECTED NOT DETECTED Final   Candida glabrata NOT DETECTED NOT DETECTED Final   Candida krusei NOT DETECTED NOT DETECTED Final   Candida parapsilosis NOT DETECTED NOT DETECTED Final   Candida tropicalis NOT DETECTED NOT DETECTED Final    Comment: Performed at [facility], [place] [address] [place], [place] [address]  Culture, blood (routine x 2)     Status: Abnormal (Preliminary result)   Collection Time: 04/27/20  1:24 AM   Specimen: BLOOD  Result Value Ref Range Status   Specimen Description BLOOD LEFT FOREARM  Final   Special Requests   Final    BOTTLES DRAWN AEROBIC AND ANAEROBIC Blood Culture adequate volume   Culture  Setup Time   Final    GRAM POSITIVE COCCI AEROBIC BOTTLE ONLY CRITICAL VALUE NOTED.  VALUE IS CONSISTENT WITH PREVIOUSLY REPORTED AND CALLED VALUE. Performed at [facility], [place] [address], [place],  [place] [address]    Culture STAPHYLOCOCCUS AUREUS (A)  Final   Report Status PENDING  Incomplete  Culture, Urine     Status: None   Collection Time: 04/27/20  3:36 PM   Specimen: Urine, Random  Result Value Ref Range Status   Specimen Description URINE, RANDOM  Final   Special Requests NONE  Final   Culture   Final    NO GROWTH Performed at [facility], [place] [address] [place], [place] [address]    Report Status 04/28/2020 FINAL  Final   Dr. Jose Persia Internal Medicine PGY-1   04/29/2020, 2:22 PM

## 2020-04-29 NOTE — Progress Notes (Signed)
PT Cancellation Note  Patient Details Name: Candice Jordan MRN: [id number] DOB: September 15, 2001   Cancelled Treatment:    Reason Eval/Treat Not Completed: (P) Other (comment)  Pt attempting to leave AMA and given dire medical condition is currently undergoing IVC. PT will follow up tomorrow.   Doll Frazee B. Beverely Risen PT, DPT Acute Rehabilitation Services Pager [phone] Office [phone]    Elon Alas Fleet 04/29/2020, 2:23 PM
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
## 2020-04-29 NOTE — Progress Notes (Signed)
PHARMACY - PHYSICIAN COMMUNICATION CRITICAL VALUE ALERT - BLOOD CULTURE IDENTIFICATION (BCID)  Candice Jordan is an 19 y.o. female who presented to [facility] on 04/27/2020 with a chief complaint of h/o MRSA bacteremia/TV endocarditis from April.   Assessment:  Pt with MRSA in 1/4 blood culture bottles drawn 5/26 0130 (prior to Vanc being started). Noted that OSH blood cultures also growing MRSA.  Name of physician (or Provider) Contacted: Ogan  Current antibiotics: Vancomycin  Changes to prescribed antibiotics recommended:  Patient is on recommended antibiotics - No changes needed  Results for orders placed or performed during the hospital encounter of 04/27/20  Blood Culture ID Panel (Reflexed) (Collected: 04/27/2020  1:23 AM)  Result Value Ref Range   Enterococcus species NOT DETECTED NOT DETECTED   Listeria monocytogenes NOT DETECTED NOT DETECTED   Staphylococcus species DETECTED (A) NOT DETECTED   Staphylococcus aureus (BCID) DETECTED (A) NOT DETECTED   Methicillin resistance DETECTED (A) NOT DETECTED   Streptococcus species NOT DETECTED NOT DETECTED   Streptococcus agalactiae NOT DETECTED NOT DETECTED   Streptococcus pneumoniae NOT DETECTED NOT DETECTED   Streptococcus pyogenes NOT DETECTED NOT DETECTED   Acinetobacter baumannii NOT DETECTED NOT DETECTED   Enterobacteriaceae species NOT DETECTED NOT DETECTED   Enterobacter cloacae complex NOT DETECTED NOT DETECTED   Escherichia coli NOT DETECTED NOT DETECTED   Klebsiella oxytoca NOT DETECTED NOT DETECTED   Klebsiella pneumoniae NOT DETECTED NOT DETECTED   Proteus species NOT DETECTED NOT DETECTED   Serratia marcescens NOT DETECTED NOT DETECTED   Haemophilus influenzae NOT DETECTED NOT DETECTED   Neisseria meningitidis NOT DETECTED NOT DETECTED   Pseudomonas aeruginosa NOT DETECTED NOT DETECTED   Candida albicans NOT DETECTED NOT DETECTED   Candida glabrata NOT DETECTED NOT DETECTED   Candida krusei NOT DETECTED NOT DETECTED    Candida parapsilosis NOT DETECTED NOT DETECTED   Candida tropicalis NOT DETECTED NOT DETECTED    Christoper Fabian, PharmD, BCPS Please see amion for complete clinical pharmacist phone list 04/29/2020  1:24 AM

## 2020-04-29 NOTE — Progress Notes (Signed)
Pharmacy Antibiotic Note  Candice Jordan is a 19 y.o. female admitted on 04/27/2020 with MRSA bacteremia/TV endocarditis and septic emboli.  Pharmacy has been consulted for vancomycin dosing.  Vanc trough below goal at 8; blood cx still positive.  Plan: Change vancomycin to 1000mg  IV every 8 hours.  Goal trough 15-20 mcg/mL.  Height: 5\' 5"  (165.1 cm) Weight: (patient refused - RN aware) IBW/kg (Calculated) : 57  Temp (24hrs), Avg:97.8 F (36.6 C), Min:97.6 F (36.4 C), Max:98.4 F (36.9 C)  Recent Labs  Lab 04/27/20 0111 04/28/20 0817 04/29/20 0326 04/29/20 2212  WBC 32.5* 17.6* 16.8*  --   CREATININE 0.67 0.65 0.59  --   LATICACIDVEN 1.5  --   --   --   VANCOTROUGH  --   --   --  8*    Estimated Creatinine Clearance: 91.7 mL/min (by C-G formula based on SCr of 0.59 mg/dL).    No Known Allergies  Antimicrobials this admission: Vancomycin 5/26>> Zosyn 5/25>>5/26  Dose adjustments this admission: Vanc 500mg  Q8 >> trough 8 >> vanc 1g Q8  Microbiology results: 5/25 Bcx (at OSH): GPC in clusters (2/2) -MRSA  5/26BCx: staph aureus 2/2; BCID w/ MRSA 1/4 5/26UCx: neg 5/26MRSA PCR: pos  Thank you for allowing pharmacy to be a part of this patient's care.  6/26, PharmD, BCPS  04/29/2020 11:21 PM

## 2020-04-29 NOTE — Progress Notes (Signed)
   04/29/20 0711  Assess: MEWS Score  ECG Heart Rate (!) 132  Assess: MEWS Score  MEWS Temp 0  MEWS Systolic 0  MEWS Pulse 3  MEWS RR 1  MEWS LOC 0  MEWS Score 4  MEWS Score Color Red  Assess: if the MEWS score is Yellow or Red  Were vital signs taken at a resting state? Yes  Focused Assessment Documented focused assessment  Early Detection of Sepsis Score *See Row Information* Low  MEWS guidelines implemented *See Row Information* Yes  Take Vital Signs  Increase Vital Sign Frequency  Red: Q 1hr X 4 then Q 4hr X 4, if remains red, continue Q 4hrs  Escalate  MEWS: Escalate Red: discuss with charge nurse/RN and provider, consider discussing with RRT  Notify: Charge Nurse/RN  Name of Charge Nurse/RN Notified Christy  Date Charge Nurse/RN Notified 04/29/20  Time Charge Nurse/RN Notified [phone]

## 2020-04-30 LAB — BPAM RBC
Blood Product Expiration Date: 202106242359
Unit Type and Rh: 5100

## 2020-04-30 LAB — CULTURE, BLOOD (ROUTINE X 2): Special Requests: ADEQUATE

## 2020-04-30 LAB — CBC WITH DIFFERENTIAL/PLATELET
Abs Immature Granulocytes: 0.15 10*3/uL — ABNORMAL HIGH (ref 0.00–0.07)
Basophils Absolute: 0.1 10*3/uL (ref 0.0–0.1)
Basophils Relative: 0 %
Eosinophils Absolute: 0.1 10*3/uL (ref 0.0–0.5)
Eosinophils Relative: 0 %
HCT: 25.7 % — ABNORMAL LOW (ref 36.0–46.0)
Hemoglobin: 7.9 g/dL — ABNORMAL LOW (ref 12.0–15.0)
Immature Granulocytes: 1 %
Lymphocytes Relative: 24 %
Lymphs Abs: 3.8 10*3/uL (ref 0.7–4.0)
MCH: 27.9 pg (ref 26.0–34.0)
MCHC: 30.7 g/dL (ref 30.0–36.0)
MCV: 90.8 fL (ref 80.0–100.0)
Monocytes Absolute: 0.6 10*3/uL (ref 0.1–1.0)
Monocytes Relative: 4 %
Neutro Abs: 11.6 10*3/uL — ABNORMAL HIGH (ref 1.7–7.7)
Neutrophils Relative %: 71 %
Platelets: 210 10*3/uL (ref 150–400)
RBC: 2.83 MIL/uL — ABNORMAL LOW (ref 3.87–5.11)
RDW: 18.9 % — ABNORMAL HIGH (ref 11.5–15.5)
WBC: 16.3 10*3/uL — ABNORMAL HIGH (ref 4.0–10.5)
nRBC: 0 % (ref 0.0–0.2)

## 2020-04-30 LAB — COMPREHENSIVE METABOLIC PANEL
ALT: 80 U/L — ABNORMAL HIGH (ref 0–44)
AST: 51 U/L — ABNORMAL HIGH (ref 15–41)
Albumin: 1.8 g/dL — ABNORMAL LOW (ref 3.5–5.0)
Alkaline Phosphatase: 108 U/L (ref 38–126)
Anion gap: 8 (ref 5–15)
BUN: 6 mg/dL (ref 6–20)
CO2: 19 mmol/L — ABNORMAL LOW (ref 22–32)
Calcium: 8 mg/dL — ABNORMAL LOW (ref 8.9–10.3)
Chloride: 111 mmol/L (ref 98–111)
Creatinine, Ser: 0.44 mg/dL (ref 0.44–1.00)
GFR calc Af Amer: >60 mL/min (ref >60)
GFR calc non Af Amer: >60 mL/min (ref >60)
Glucose, Bld: 84 mg/dL (ref 70–99)
Potassium: 4 mmol/L (ref 3.5–5.1)
Sodium: 138 mmol/L (ref 135–145)
Total Bilirubin: 0.8 mg/dL (ref 0.3–1.2)
Total Protein: 6.8 g/dL (ref 6.5–8.1)

## 2020-04-30 LAB — TYPE AND SCREEN
ABO/RH(D): O POS
Antibody Screen: NEGATIVE
Unit division: 0

## 2020-04-30 LAB — MAGNESIUM: Magnesium: 1.7 mg/dL (ref 1.7–2.4)

## 2020-04-30 MED ORDER — BENZONATATE 100 MG PO CAPS
200.0000 mg | ORAL_CAPSULE | Freq: Three times a day (TID) | ORAL | Status: DC | PRN
  Administered 2020-04-30 – 2020-05-02 (×3): 200 mg via ORAL
  Filled 2020-04-30 (×3): qty 2

## 2020-04-30 MED ORDER — MAGNESIUM SULFATE 4 GM/100ML IV SOLN
4.0000 g | Freq: Once | INTRAVENOUS | Status: AC
  Administered 2020-04-30: 4 g via INTRAVENOUS
  Filled 2020-04-30: qty 100

## 2020-04-30 NOTE — Progress Notes (Addendum)
PROGRESS NOTE    Candice Jordan  FGH:[id number] DOB: November 30, 2001 DOA: 04/27/2020 PCP: System, Provider Not In    No chief complaint on file.   Brief Narrative:  19 year old female presents to [facility] ED on 5/25 with reported 1 week of shortness of breath. Patient was found at [address] with oxygen saturation 79%. Reports using Heroin last night. Diagnosed with MRSA Bacteremia/Endocarditis and COVID 03/08/2020 however has left AMA numerous times throughout the month of April. On arrival to ED. CXR revealing of large right sided tension Pneumothorax. Chest Tube placed. 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  Patient met criteria for sepsis on admission with tachypnea, tachycardia, leukocytosis, concern for untreated bacteremia.  Repeat 2D echo which was done showed worsening/severe tricuspid valvular regurgitation, tricuspid valve vegetation attached to posterior leaflet measuring 0.9 cm x 0.5 cm more apparent on this study the last study.  Severely dilated right atrial size.  Hyperdynamic right ventricular systolic function.  Continue empiric IV vancomycin.  CT surgery was consulted 04/28/2020 for possible valve repair however due to ongoing IV drug abuse patient at high risk for reinfection if she were to undergo valve replacement and recommending focusing on drug  rehabilitation.  Per CT surgery obtaining a TEE will not change surgical candidacy and recommending medical management for now.  Repeat blood cultures from 04/29/2028 + for staph aureus.  Continue IV vancomycin.  ID following.  2.  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  Chest tube removed 04/28/2020 per PCCM.  Repeat chest x-ray done showed no recurrent pneumothorax.  Pulmonary hygiene.  Supportive care.  3.  Polysubstance abuse/heroin use UDS noted at outside hospital to be positive for amphetamines. Patient noted to have multiple admissions where she is leaving AMA endocarditis.  Patient seen by psychiatry on 04/27/2020 and is felt patient lacks capacity to make an informed medical decision regarding her medical care and disposition and recommending social work evaluation to contact next of kin no appropriate hospital administrator to help with decision-making process.  Continue Suboxone.  Was on clonidine taper/detox protocol.  Patient wanted to leave AMA again (04/29/2020).  Patient however did state she understands the risk of leaving AMA which include death.  Was also felt by PCCM that patient lacked capacity poor insight into her current medical situation.  Patient with poor insight into her current medical situation.  Patient has subsequently been IVC'd to finish out treatment for MRSA disseminated infection/endocarditis with worsening tricuspid regurgitation.  Patient deemed not a surgical candidate due to ongoing IV drug use.  Patient has been reassessed by psych who are in agreement that patient lacks capacity.    4.  Anemia of chronic disease Anemia panel consistent with anemia of  chronic disease.  Status post transfusion 1 unit packed red blood cells.  Hemoglobin currently at 7.9 from 7.0.  DIC  panel negative for schistocytes. Transfusion threshold hemoglobin <7.  5.  Tachycardia Likely secondary to problems # 1,2,3,4.  Continue treatment with IV antibiotics.  Patient s/p transfusion 1 unit of packed red blood cells.  Lopressor as needed for heart rate > 130.  Follow.  6.  Hypomagnesemia/hypokalemia Magnesium at 1.7 this morning.  Magnesium sulfate 4 g IV x1.  Potassium at 4.0.    7.  Positive hep C antibody/transaminitis HCV RNA quant of 2260.  Will need outpatient follow-up with ID.    DVT prophylaxis: SCDs Code Status: Full Family Communication: Updated patient.  No family at bedside. Disposition:   Status is: Inpatient    Dispo: The patient is from: Home              Anticipated d/c is to: To be determined              Anticipated d/c date is: Once IV antibiotics have been completed.  Needs antibiotics for 6 weeks.              Patient currently on IV antibiotics, with tricuspid endocarditis, being followed by ID.  Patient has now been IVC'D.  Patient currently not safe to be discharged home as patient has left multiple times AMA and now has worsening tricuspid regurg with tricuspid endocarditis.        Consultants:   ID: Dr. Baxter Flattery 04/28/2020  Psychiatry: Dr. Astrid Divine 5/26-27/2021  CT surgery: Dr. Kipp Brood  Procedures:   Chest x-ray 04/27/2020, 04/28/2020  2D echo 04/27/2020----TV endocarditis with progression of TR that is now severe.  TEE recommended.  Lower extremity Dopplers 04/28/2020  Right-sided chest tube 04/26/2020>>>> 04/28/2020  Antimicrobials:   IV Zosyn x1 dose.  IV vancomycin 04/27/2020>>>>  Micro Data:  Blood 5/26 >> Urine 5/26 >> MRSA PCR 5/26 >> Positive     Subjective: Patient sleeping comfortably.  Sitter at bedside.   Objective: Vitals:   04/30/20 0646 04/30/20 0700 04/30/20 0822 04/30/20 1147  BP:   130/89   Pulse: (!) 102 (!) 110    Resp:      Temp:   98.6 F (37 C) 97.6 F (36.4 C)  TempSrc:   Oral Oral  SpO2:  96% 94%    Weight:      Height:        Intake/Output Summary (Last 24 hours) at 04/30/2020 1236 Last data filed at 04/30/2020 0500 Gross per 24 hour  Intake 809.17 ml  Output --  Net 809.17 ml   Filed Weights   04/28/20 0500 04/28/20 2056 04/30/20 0600  Weight: 51.2 kg 51.8 kg 54.6 kg    Examination:  General exam: NAD Respiratory system: Some scattered coarse breath sounds.  No wheezing.  Normal respiratory effort.  Cardiovascular system: Tachycardia.  No JVD.  No lower extremity edema.  Gastrointestinal system: Abdomen is nontender, nondistended, soft, positive bowel sounds.  No rebound.  No guarding.  Central nervous system: Asleep. No focal neurological deficits. Extremities: Symmetric 5 x 5 power. Skin: No rashes, lesions or ulcers Psychiatry: Judgement and insight appear normal. Mood & affect appropriate.     Data Reviewed: I have personally reviewed following labs and imaging studies  CBC: Recent Labs  Lab 04/27/20 0111 04/27/20 0219 04/28/20 0817 04/29/20 0326 04/29/20 1422 04/30/20 0800  WBC 32.5*  --  17.6* 16.8*  --  16.3*  NEUTROABS 28.9*  --   --   --   --  11.6*  HGB 8.2* 9.2* 7.7* 7.0*  --  7.9*  HCT 26.8* 27.0* 26.1* 24.1*  --  25.7*  MCV 86.7  --  89.4 90.9  --  90.8  PLT 273  266  --  222 233 267 096    Basic Metabolic Panel: Recent Labs  Lab 04/27/20 0111 04/27/20 0219 04/28/20 0817 04/28/20 2103 04/29/20 0326 04/29/20 0830 04/30/20 0800  NA 131* 134* 139  --  140  --  138  K 3.7 3.5 3.0*  --  2.9*  --  4.0  CL 102  --  110  --  113*  --  111  CO2 19*  --  20*  --  18*  --  19*  GLUCOSE 99  --  109*  --  121*  --  84  BUN 18  --  6  --  7  --  6  CREATININE 0.67  --  0.65  --  0.59  --  0.44  CALCIUM 7.8*  --  8.0*  --  7.7*  --  8.0*  MG 1.6*  --  1.9  --   --  1.7 1.7  PHOS 2.3*  --  1.5* 2.7  --   --   --     GFR: Estimated Creatinine Clearance: 96.7 mL/min (by C-G formula based on SCr of 0.44 mg/dL).  Liver Function  Tests: Recent Labs  Lab 04/27/20 0111 04/29/20 0326 04/30/20 0800  AST 167* 85* 51*  ALT 102* 102* 80*  ALKPHOS 139* 100 108  BILITOT 1.4* 0.6 0.8  PROT 7.6 6.5 6.8  ALBUMIN 1.8* 1.8* 1.8*    CBG: Recent Labs  Lab 04/27/20 0414 04/27/20 0803 04/27/20 2340 04/28/20 0337 04/28/20 0813  GLUCAP 135* 126* 123* 130* 98     Recent Results (from the past 240 hour(s))  MRSA PCR Screening     Status: Abnormal   Collection Time: 04/27/20  1:15 AM   Specimen: Nasopharyngeal  Result Value Ref Range Status   MRSA by PCR POSITIVE (A) NEGATIVE Final    Comment: CRITICAL RESULT CALLED TO, READ BACK BY AND VERIFIED WITH: RN [facility] MUELLER [phone] '@[id number]'$  THANEY   Culture, blood (routine x 2)     Status: Abnormal   Collection Time: 04/27/20  1:23 AM   Specimen: BLOOD  Result Value Ref Range Status   Specimen Description BLOOD LEFT ARM  Final   Special Requests   Final    BOTTLES DRAWN AEROBIC AND ANAEROBIC Blood Culture adequate volume   Culture  Setup Time   Final    AEROBIC BOTTLE ONLY GRAM POSITIVE COCCI IN CLUSTERS CRITICAL RESULT CALLED TO, READ BACK BY AND VERIFIED WITH: K AMEND [facility] 04/29/20 0117 JDW Performed at [facility], [place] [address] [place], [place] [address]    Culture METHICILLIN RESISTANT STAPHYLOCOCCUS AUREUS (A)  Final   Report Status 04/30/2020 FINAL  Final   Organism ID, Bacteria METHICILLIN RESISTANT STAPHYLOCOCCUS AUREUS  Final      Susceptibility   Methicillin resistant staphylococcus aureus - MIC*    CIPROFLOXACIN >=8 RESISTANT Resistant     ERYTHROMYCIN >=8 RESISTANT Resistant     GENTAMICIN <=0.5 SENSITIVE Sensitive     OXACILLIN >=4 RESISTANT Resistant     TETRACYCLINE <=1 SENSITIVE Sensitive     VANCOMYCIN <=0.5 SENSITIVE Sensitive     TRIMETH/SULFA <=10 SENSITIVE Sensitive     CLINDAMYCIN <=0.25 SENSITIVE Sensitive     RIFAMPIN <=0.5 SENSITIVE Sensitive  Inducible Clindamycin NEGATIVE Sensitive     * METHICILLIN RESISTANT  STAPHYLOCOCCUS AUREUS  Blood Culture ID Panel (Reflexed)     Status: Abnormal   Collection Time: 04/27/20  1:23 AM  Result Value Ref Range Status   Enterococcus species NOT DETECTED NOT DETECTED Final   Listeria monocytogenes NOT DETECTED NOT DETECTED Final   Staphylococcus species DETECTED (A) NOT DETECTED Final    Comment: CRITICAL RESULT CALLED TO, READ BACK BY AND VERIFIED WITH: K AMEND [facility] 04/29/20 0117 JDW    Staphylococcus aureus (BCID) DETECTED (A) NOT DETECTED Final    Comment: Methicillin (oxacillin)-resistant Staphylococcus aureus (MRSA). MRSA is predictably resistant to beta-lactam antibiotics (except ceftaroline). Preferred therapy is vancomycin unless clinically contraindicated. Patient requires contact precautions if  hospitalized. CRITICAL RESULT CALLED TO, READ BACK BY AND VERIFIED WITH: K AMEND [facility] 04/29/20 0117 JDW    Methicillin resistance DETECTED (A) NOT DETECTED Final    Comment: CRITICAL RESULT CALLED TO, READ BACK BY AND VERIFIED WITH: K AMEND [facility] 04/29/20 0117    Streptococcus species NOT DETECTED NOT DETECTED Final   Streptococcus agalactiae NOT DETECTED NOT DETECTED Final   Streptococcus pneumoniae NOT DETECTED NOT DETECTED Final   Streptococcus pyogenes NOT DETECTED NOT DETECTED Final   Acinetobacter baumannii NOT DETECTED NOT DETECTED Final   Enterobacteriaceae species NOT DETECTED NOT DETECTED Final   Enterobacter cloacae complex NOT DETECTED NOT DETECTED Final   Escherichia coli NOT DETECTED NOT DETECTED Final   Klebsiella oxytoca NOT DETECTED NOT DETECTED Final   Klebsiella pneumoniae NOT DETECTED NOT DETECTED Final   Proteus species NOT DETECTED NOT DETECTED Final   Serratia marcescens NOT DETECTED NOT DETECTED Final   Haemophilus influenzae NOT DETECTED NOT DETECTED Final   Neisseria meningitidis NOT DETECTED NOT DETECTED Final   Pseudomonas aeruginosa NOT DETECTED NOT DETECTED Final   Candida albicans NOT DETECTED NOT DETECTED Final    Candida glabrata NOT DETECTED NOT DETECTED Final   Candida krusei NOT DETECTED NOT DETECTED Final   Candida parapsilosis NOT DETECTED NOT DETECTED Final   Candida tropicalis NOT DETECTED NOT DETECTED Final    Comment: Performed at [facility], [address], [place], [place] [address]  Culture, blood (routine x 2)     Status: Abnormal   Collection Time: 04/27/20  1:24 AM   Specimen: BLOOD  Result Value Ref Range Status   Specimen Description BLOOD LEFT FOREARM  Final   Special Requests   Final    BOTTLES DRAWN AEROBIC AND ANAEROBIC Blood Culture adequate volume   Culture  Setup Time   Final    GRAM POSITIVE COCCI IN BOTH AEROBIC AND ANAEROBIC BOTTLES CRITICAL VALUE NOTED.  VALUE IS CONSISTENT WITH PREVIOUSLY REPORTED AND CALLED VALUE.    Culture (A)  Final    STAPHYLOCOCCUS AUREUS SUSCEPTIBILITIES PERFORMED ON PREVIOUS CULTURE WITHIN THE LAST 5 DAYS. Performed at [facility], [place] [address] [place], [place] [address]    Report Status 04/30/2020 FINAL  Final  Culture, Urine     Status: None   Collection Time: 04/27/20  3:36 PM   Specimen: Urine, Random  Result Value Ref Range Status   Specimen Description URINE, RANDOM  Final   Special Requests NONE  Final   Culture   Final    NO GROWTH Performed at [facility], [place] [address] [place], [place] [address]    Report Status 04/28/2020 FINAL  Final  Culture, blood (routine x 2)     Status: Abnormal (Preliminary  result)   Collection Time: 04/29/20  9:55 AM   Specimen: BLOOD  Result Value Ref Range Status   Specimen Description BLOOD RIGHT ANTECUBITAL  Final   Special Requests   Final    BOTTLES DRAWN AEROBIC ONLY Blood Culture results may not be optimal due to an inadequate volume of blood received in culture bottles   Culture  Setup Time   Final    AEROBIC BOTTLE ONLY GRAM POSITIVE COCCI IN CLUSTERS CRITICAL VALUE NOTED.  VALUE IS CONSISTENT WITH PREVIOUSLY REPORTED AND CALLED VALUE.    Culture (A)   Final    STAPHYLOCOCCUS AUREUS SUSCEPTIBILITIES PERFORMED ON PREVIOUS CULTURE WITHIN THE LAST 5 DAYS. Performed at [facility], [place] [address] [place], [place] [address]    Report Status PENDING  Incomplete  Culture, blood (routine x 2)     Status: Abnormal (Preliminary result)   Collection Time: 04/29/20  9:59 AM   Specimen: BLOOD RIGHT HAND  Result Value Ref Range Status   Specimen Description BLOOD RIGHT HAND  Final   Special Requests   Final    BOTTLES DRAWN AEROBIC AND ANAEROBIC Blood Culture results may not be optimal due to an inadequate volume of blood received in culture bottles   Culture  Setup Time   Final    IN BOTH AEROBIC AND ANAEROBIC BOTTLES GRAM POSITIVE COCCI IN CLUSTERS CRITICAL VALUE NOTED.  VALUE IS CONSISTENT WITH PREVIOUSLY REPORTED AND CALLED VALUE.    Culture (A)  Final    STAPHYLOCOCCUS AUREUS SUSCEPTIBILITIES PERFORMED ON PREVIOUS CULTURE WITHIN THE LAST 5 DAYS. Performed at [facility], [place] [address], [place], [place] [address]    Report Status PENDING  Incomplete         Radiology Studies: No results found.      Scheduled Meds: . buprenorphine-naloxone  1 tablet Sublingual BID  . Chlorhexidine Gluconate Cloth  6 each Topical Q0600  . QUEtiapine  50 mg Oral BID   Continuous Infusions: . dexmedetomidine (PRECEDEX) IV infusion Stopped (04/28/20 0725)  . magnesium sulfate bolus IVPB 4 g (04/30/20 1117)  . vancomycin 1,000 mg (04/30/20 0541)     LOS: 3 days    Time spent: 40 minutes    Irine Seal, MD [facility]   To contact the attending provider between 7A-7P or the covering provider during after hours 7P-7A, please log into the web site [email or web address] and access using universal [place] password for that web site. If you do not have the password, please call the hospital operator.  04/30/2020, 12:36 PM

## 2020-04-30 NOTE — Progress Notes (Signed)
PT Cancellation Note  Patient Details Name: Candice Jordan MRN: [id number] DOB: 07/28/2001   Cancelled Treatment:    Reason Eval/Treat Not Completed: Other (comment). Pt soundly sleeping and did not arouse to auditory stimuli. Sitter present. PT will continue to f/u with pt acutely as available and appropriate.    Alessandra Bevels Lakyla Biswas 04/30/2020, 2:08 PM

## 2020-05-01 LAB — BPAM RBC
Blood Product Expiration Date: 202106242359
ISSUE DATE / TIME: 202105290104
Unit Type and Rh: 5100

## 2020-05-01 LAB — COMPREHENSIVE METABOLIC PANEL
ALT: 79 U/L — ABNORMAL HIGH (ref 0–44)
AST: 63 U/L — ABNORMAL HIGH (ref 15–41)
Albumin: 1.8 g/dL — ABNORMAL LOW (ref 3.5–5.0)
Alkaline Phosphatase: 128 U/L — ABNORMAL HIGH (ref 38–126)
Anion gap: 6 (ref 5–15)
BUN: 8 mg/dL (ref 6–20)
CO2: 23 mmol/L (ref 22–32)
Calcium: 7.9 mg/dL — ABNORMAL LOW (ref 8.9–10.3)
Chloride: 110 mmol/L (ref 98–111)
Creatinine, Ser: 0.54 mg/dL (ref 0.44–1.00)
GFR calc Af Amer: >60 mL/min (ref >60)
GFR calc non Af Amer: >60 mL/min (ref >60)
Glucose, Bld: 91 mg/dL (ref 70–99)
Potassium: 4.2 mmol/L (ref 3.5–5.1)
Sodium: 139 mmol/L (ref 135–145)
Total Bilirubin: 0.5 mg/dL (ref 0.3–1.2)
Total Protein: 6.7 g/dL (ref 6.5–8.1)

## 2020-05-01 LAB — TYPE AND SCREEN
ABO/RH(D): O POS
Antibody Screen: NEGATIVE
Unit division: 0

## 2020-05-01 LAB — CULTURE, BLOOD (ROUTINE X 2)

## 2020-05-01 LAB — CBC WITH DIFFERENTIAL/PLATELET
Abs Immature Granulocytes: 0.08 10*3/uL — ABNORMAL HIGH (ref 0.00–0.07)
Basophils Absolute: 0 10*3/uL (ref 0.0–0.1)
Basophils Relative: 0 %
Eosinophils Absolute: 0.1 10*3/uL (ref 0.0–0.5)
Eosinophils Relative: 1 %
HCT: 23.7 % — ABNORMAL LOW (ref 36.0–46.0)
Hemoglobin: 7.2 g/dL — ABNORMAL LOW (ref 12.0–15.0)
Immature Granulocytes: 1 %
Lymphocytes Relative: 30 %
Lymphs Abs: 3.9 10*3/uL (ref 0.7–4.0)
MCH: 27.5 pg (ref 26.0–34.0)
MCHC: 30.4 g/dL (ref 30.0–36.0)
MCV: 90.5 fL (ref 80.0–100.0)
Monocytes Absolute: 0.6 10*3/uL (ref 0.1–1.0)
Monocytes Relative: 5 %
Neutro Abs: 8.4 10*3/uL — ABNORMAL HIGH (ref 1.7–7.7)
Neutrophils Relative %: 63 %
Platelets: 191 10*3/uL (ref 150–400)
RBC: 2.62 MIL/uL — ABNORMAL LOW (ref 3.87–5.11)
RDW: 19.3 % — ABNORMAL HIGH (ref 11.5–15.5)
WBC: 13 10*3/uL — ABNORMAL HIGH (ref 4.0–10.5)
nRBC: 0 % (ref 0.0–0.2)

## 2020-05-01 LAB — VANCOMYCIN, TROUGH: Vancomycin Tr: 16 ug/mL (ref 15–20)

## 2020-05-01 LAB — MAGNESIUM: Magnesium: 1.8 mg/dL (ref 1.7–2.4)

## 2020-05-01 MED ORDER — MAGNESIUM SULFATE 4 GM/100ML IV SOLN
4.0000 g | Freq: Once | INTRAVENOUS | Status: AC
  Administered 2020-05-01: 4 g via INTRAVENOUS
  Filled 2020-05-01: qty 100

## 2020-05-01 MED ORDER — VANCOMYCIN HCL IN DEXTROSE 1-5 GM/200ML-% IV SOLN
1000.0000 mg | Freq: Three times a day (TID) | INTRAVENOUS | Status: DC
  Administered 2020-05-01 – 2020-05-07 (×18): 1000 mg via INTRAVENOUS
  Filled 2020-05-01 (×20): qty 200

## 2020-05-01 MED ORDER — MAGNESIUM OXIDE 400 (241.3 MG) MG PO TABS
400.0000 mg | ORAL_TABLET | Freq: Two times a day (BID) | ORAL | Status: DC
  Administered 2020-05-01 – 2020-05-26 (×51): 400 mg via ORAL
  Filled 2020-05-01 (×51): qty 1

## 2020-05-01 MED ORDER — SODIUM CHLORIDE 0.9 % IV SOLN
INTRAVENOUS | Status: DC | PRN
  Administered 2020-05-01: 10 mL via INTRAVENOUS
  Administered 2020-05-02: 250 mL via INTRAVENOUS
  Administered 2020-05-02: 10 mL via INTRAVENOUS
  Administered 2020-05-19: 500 mL via INTRAVENOUS

## 2020-05-01 NOTE — Progress Notes (Signed)
PROGRESS NOTE    Candice Jordan  FGH:[id number] DOB: November 30, 2001 DOA: 04/27/2020 PCP: System, Provider Not In    No chief complaint on file.   Brief Narrative:  19 year old female presents to [facility] ED on 5/25 with reported 1 week of shortness of breath. Patient was found at [address] with oxygen saturation 79%. Reports using Heroin last night. Diagnosed with MRSA Bacteremia/Endocarditis and COVID 03/08/2020 however has left AMA numerous times throughout the month of April. On arrival to ED. CXR revealing of large right sided tension Pneumothorax. Chest Tube placed. 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  Patient met criteria for sepsis on admission with tachypnea, tachycardia, leukocytosis, concern for untreated bacteremia.  Repeat 2D echo which was done showed worsening/severe tricuspid valvular regurgitation, tricuspid valve vegetation attached to posterior leaflet measuring 0.9 cm x 0.5 cm more apparent on this study the last study.  Severely dilated right atrial size.  Hyperdynamic right ventricular systolic function.  Continue empiric IV vancomycin.  CT surgery was consulted 04/28/2020 for possible valve repair however due to ongoing IV drug abuse patient at high risk for reinfection if she were to undergo valve replacement and recommending focusing on drug  rehabilitation.  Per CT surgery obtaining a TEE will not change surgical candidacy and recommending medical management for now.  Repeat blood cultures from 04/29/2028 + for staph aureus.  Continue IV vancomycin.  ID following.  2.  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  Chest tube removed 04/28/2020 per PCCM.  Repeat chest x-ray done showed no recurrent pneumothorax.  Pulmonary hygiene.  Supportive care.  3.  Polysubstance abuse/heroin use UDS noted at outside hospital to be positive for amphetamines. Patient noted to have multiple admissions where she is leaving AMA endocarditis.  Patient seen by psychiatry on 04/27/2020 and is felt patient lacks capacity to make an informed medical decision regarding her medical care and disposition and recommending social work evaluation to contact next of kin no appropriate hospital administrator to help with decision-making process.  Continue Suboxone.  Was on clonidine taper/detox protocol.  Patient wanted to leave AMA again (04/29/2020).  Patient however did state she understands the risk of leaving AMA which include death.  Was also felt by PCCM that patient lacked capacity poor insight into her current medical situation.  Patient with poor insight into her current medical situation.  Patient has subsequently been IVC'd to finish out treatment for MRSA disseminated infection/endocarditis with worsening tricuspid regurgitation.  Patient deemed not a surgical candidate due to ongoing IV drug use.  Patient has been reassessed by psych who are in agreement that patient lacks capacity.    4.  Anemia of chronic disease Anemia panel consistent with anemia of  chronic disease.  Status post transfusion 1 unit packed red blood cells.  Hemoglobin currently at 7.2 from 7.9 from 7.0.   DIC panel negative for schistocytes. Transfusion threshold hemoglobin <7.  5.  Tachycardia Likely secondary to problems # 1,2,3,4.  Continue treatment with IV antibiotics.  Patient s/p transfusion 1 unit of packed red blood cells.  Lopressor as needed for heart rate > 130.  Follow.  6.  Hypomagnesemia/hypokalemia Magnesium at 1.8 this morning.  Magnesium sulfate 4 g IV x1.  Potassium at 4.2.    7.  Positive hep C antibody/transaminitis HCV RNA quant of 2260.  Outpatient follow-up with ID.  Will need outpatient follow-up with ID.    DVT prophylaxis: SCDs Code Status: Full Family Communication: Updated patient.  No family at bedside. Disposition:   Status is: Inpatient    Dispo: The patient is from: Home              Anticipated d/c is to: To be determined              Anticipated d/c date is: Once IV antibiotics have been completed.  Needs antibiotics for 6 weeks.              Patient currently on IV antibiotics, with tricuspid endocarditis, being followed by ID.  Patient has now been IVC'D.  Patient currently not safe to be discharged home as patient has left multiple times AMA and now has worsening tricuspid regurg with tricuspid endocarditis.        Consultants:   ID: Dr. Baxter Flattery 04/28/2020  Psychiatry: Dr. Astrid Divine 5/26-27/2021  CT surgery: Dr. Kipp Brood  Procedures:   Chest x-ray 04/27/2020, 04/28/2020  2D echo 04/27/2020----TV endocarditis with progression of TR that is now severe.  TEE recommended.  Lower extremity Dopplers 04/28/2020  Right-sided chest tube 04/26/2020>>>> 04/28/2020  Antimicrobials:   IV Zosyn x1 dose.  IV vancomycin 04/27/2020>>>>  Micro Data:  Blood 5/26 >> Urine 5/26 >> MRSA PCR 5/26 >> Positive     Subjective: Patient sleeping but easily arousable.  Denies any significant shortness of breath.  No significant chest pain.  Patient wanting to be discharged.   Objective: Vitals:   05/01/20 0402 05/01/20 0603 05/01/20 0754 05/01/20 1132   BP: (!) 130/92 125/88 (!) 130/92 132/82  Pulse: (!) 123 (!) 114 (!) 118 (!) 129  Resp: (!) 26 (!) 28    Temp: 98.9 F (37.2 C) 98.4 F (36.9 C) 98.3 F (36.8 C) 98.4 F (36.9 C)  TempSrc: Axillary Axillary Oral Oral  SpO2:   99% 97%  Weight:      Height:        Intake/Output Summary (Last 24 hours) at 05/01/2020 1622 Last data filed at 05/01/2020 0031 Gross per 24 hour  Intake 249 ml  Output --  Net 249 ml   Filed Weights   04/28/20 0500 04/28/20 2056 04/30/20 0600  Weight: 51.2 kg 51.8 kg 54.6 kg    Examination:  General exam: NAD Respiratory system: Clear to auscultation bilaterally.  No wheezes, no crackles, no rhonchi.  Normal respiratory effort.  Cardiovascular system: Tachycardia.  No JVD.  No lower extremity edema.   Gastrointestinal system: Abdomen is soft, nontender, nondistended, positive bowel sounds.  No rebound.  No guarding. Central nervous system: Alert and oriented. No focal neurological deficits. Extremities: Symmetric 5 x 5 power. Skin: No rashes, lesions or ulcers Psychiatry: Judgement and insight appear normal. Mood & affect appropriate.     Data Reviewed: I have personally reviewed  following labs and imaging studies  CBC: Recent Labs  Lab 04/27/20 0111 04/27/20 0111 04/27/20 0219 04/28/20 0817 04/29/20 0326 04/29/20 1422 04/30/20 0800 05/01/20 0426  WBC 32.5*  --   --  17.6* 16.8*  --  16.3* 13.0*  NEUTROABS 28.9*  --   --   --   --   --  11.6* 8.4*  HGB 8.2*   < > 9.2* 7.7* 7.0*  --  7.9* 7.2*  HCT 26.8*   < > 27.0* 26.1* 24.1*  --  25.7* 23.7*  MCV 86.7  --   --  89.4 90.9  --  90.8 90.5  PLT 273  266   < >  --  222 233 267 210 191   < > = values in this interval not displayed.    Basic Metabolic Panel: Recent Labs  Lab 04/27/20 0111 04/27/20 0111 04/27/20 0219 04/28/20 0817 04/28/20 2103 04/29/20 0326 04/29/20 0830 04/30/20 0800 05/01/20 0426  NA 131*   < > 134* 139  --  140  --  138 139  K 3.7   < > 3.5 3.0*  --  2.9*   --  4.0 4.2  CL 102  --   --  110  --  113*  --  111 110  CO2 19*  --   --  20*  --  18*  --  19* 23  GLUCOSE 99  --   --  109*  --  121*  --  84 91  BUN 18  --   --  6  --  7  --  6 8  CREATININE 0.67  --   --  0.65  --  0.59  --  0.44 0.54  CALCIUM 7.8*  --   --  8.0*  --  7.7*  --  8.0* 7.9*  MG 1.6*  --   --  1.9  --   --  1.7 1.7 1.8  PHOS 2.3*  --   --  1.5* 2.7  --   --   --   --    < > = values in this interval not displayed.    GFR: Estimated Creatinine Clearance: 96.7 mL/min (by C-G formula based on SCr of 0.54 mg/dL).  Liver Function Tests: Recent Labs  Lab 04/27/20 0111 04/29/20 0326 04/30/20 0800 05/01/20 0426  AST 167* 85* 51* 63*  ALT 102* 102* 80* 79*  ALKPHOS 139* 100 108 128*  BILITOT 1.4* 0.6 0.8 0.5  PROT 7.6 6.5 6.8 6.7  ALBUMIN 1.8* 1.8* 1.8* 1.8*    CBG: Recent Labs  Lab 04/27/20 0414 04/27/20 0803 04/27/20 2340 04/28/20 0337 04/28/20 0813  GLUCAP 135* 126* 123* 130* 98     Recent Results (from the past 240 hour(s))  MRSA PCR Screening     Status: Abnormal   Collection Time: 04/27/20  1:15 AM   Specimen: Nasopharyngeal  Result Value Ref Range Status   MRSA by PCR POSITIVE (A) NEGATIVE Final    Comment: CRITICAL RESULT CALLED TO, READ BACK BY AND VERIFIED WITH: RN MELYNN MUELLER [id number] @0335 THANEY   Culture, blood (routine x 2)     Status: Abnormal (Preliminary result)   Collection Time: 04/27/20  1:23 AM   Specimen: BLOOD  Result Value Ref Range Status   Specimen Description BLOOD LEFT ARM  Final   Special Requests   Final    BOTTLES DRAWN AEROBIC AND ANAEROBIC Blood Culture adequate volume   Culture  Setup Time     Final    IN BOTH AEROBIC AND ANAEROBIC BOTTLES GRAM POSITIVE COCCI IN CLUSTERS CRITICAL RESULT CALLED TO, READ BACK BY AND VERIFIED WITH: K AMEND PHARMD 04/29/20 0117 JDW Performed at [facility], [address] [place], [place] [address]    Culture METHICILLIN RESISTANT STAPHYLOCOCCUS AUREUS (A)  Final    Report Status PENDING  Incomplete   Organism ID, Bacteria METHICILLIN RESISTANT STAPHYLOCOCCUS AUREUS  Final      Susceptibility   Methicillin resistant staphylococcus aureus - MIC*    CIPROFLOXACIN >=8 RESISTANT Resistant     ERYTHROMYCIN >=8 RESISTANT Resistant     GENTAMICIN <=0.5 SENSITIVE Sensitive     OXACILLIN >=4 RESISTANT Resistant     TETRACYCLINE <=1 SENSITIVE Sensitive     VANCOMYCIN <=0.5 SENSITIVE Sensitive     TRIMETH/SULFA <=10 SENSITIVE Sensitive     CLINDAMYCIN <=0.25 SENSITIVE Sensitive     RIFAMPIN <=0.5 SENSITIVE Sensitive     Inducible Clindamycin NEGATIVE Sensitive     * METHICILLIN RESISTANT STAPHYLOCOCCUS AUREUS  Blood Culture ID Panel (Reflexed)     Status: Abnormal   Collection Time: 04/27/20  1:23 AM  Result Value Ref Range Status   Enterococcus species NOT DETECTED NOT DETECTED Final   Listeria monocytogenes NOT DETECTED NOT DETECTED Final   Staphylococcus species DETECTED (A) NOT DETECTED Final    Comment: CRITICAL RESULT CALLED TO, READ BACK BY AND VERIFIED WITH: K AMEND PHARMD 04/29/20 0117 JDW    Staphylococcus aureus (BCID) DETECTED (A) NOT DETECTED Final    Comment: Methicillin (oxacillin)-resistant Staphylococcus aureus (MRSA). MRSA is predictably resistant to beta-lactam antibiotics (except ceftaroline). Preferred therapy is vancomycin unless clinically contraindicated. Patient requires contact precautions if  hospitalized. CRITICAL RESULT CALLED TO, READ BACK BY AND VERIFIED WITH: K AMEND PHARMD 04/29/20 0117 JDW    Methicillin resistance DETECTED (A) NOT DETECTED Final    Comment: CRITICAL RESULT CALLED TO, READ BACK BY AND VERIFIED WITH: K AMEND PHARMD 04/29/20 0117    Streptococcus species NOT DETECTED NOT DETECTED Final   Streptococcus agalactiae NOT DETECTED NOT DETECTED Final   Streptococcus pneumoniae NOT DETECTED NOT DETECTED Final   Streptococcus pyogenes NOT DETECTED NOT DETECTED Final   Acinetobacter baumannii NOT DETECTED NOT  DETECTED Final   Enterobacteriaceae species NOT DETECTED NOT DETECTED Final   Enterobacter cloacae complex NOT DETECTED NOT DETECTED Final   Escherichia coli NOT DETECTED NOT DETECTED Final   Klebsiella oxytoca NOT DETECTED NOT DETECTED Final   Klebsiella pneumoniae NOT DETECTED NOT DETECTED Final   Proteus species NOT DETECTED NOT DETECTED Final   Serratia marcescens NOT DETECTED NOT DETECTED Final   Haemophilus influenzae NOT DETECTED NOT DETECTED Final   Neisseria meningitidis NOT DETECTED NOT DETECTED Final   Pseudomonas aeruginosa NOT DETECTED NOT DETECTED Final   Candida albicans NOT DETECTED NOT DETECTED Final   Candida glabrata NOT DETECTED NOT DETECTED Final   Candida krusei NOT DETECTED NOT DETECTED Final   Candida parapsilosis NOT DETECTED NOT DETECTED Final   Candida tropicalis NOT DETECTED NOT DETECTED Final    Comment: Performed at [facility], [address] [place], [place] [address]  Culture, blood (routine x 2)     Status: Abnormal   Collection Time: 04/27/20  1:24 AM   Specimen: BLOOD  Result Value Ref Range Status   Specimen Description BLOOD LEFT FOREARM  Final   Special Requests   Final    BOTTLES DRAWN AEROBIC AND ANAEROBIC Blood Culture adequate volume   Culture  Setup   Time   Final    GRAM POSITIVE COCCI IN BOTH AEROBIC AND ANAEROBIC BOTTLES CRITICAL VALUE NOTED.  VALUE IS CONSISTENT WITH PREVIOUSLY REPORTED AND CALLED VALUE.    Culture (A)  Final    STAPHYLOCOCCUS AUREUS SUSCEPTIBILITIES PERFORMED ON PREVIOUS CULTURE WITHIN THE LAST 5 DAYS. Performed at [facility], [address] [place], [place] [address]    Report Status 04/30/2020 FINAL  Final  Culture, Urine     Status: None   Collection Time: 04/27/20  3:36 PM   Specimen: Urine, Random  Result Value Ref Range Status   Specimen Description URINE, RANDOM  Final   Special Requests NONE  Final   Culture   Final    NO GROWTH Performed at [facility], [address]  [place], [place] [address]    Report Status 04/28/2020 FINAL  Final  Culture, blood (routine x 2)     Status: Abnormal   Collection Time: 04/29/20  9:55 AM   Specimen: BLOOD  Result Value Ref Range Status   Specimen Description BLOOD RIGHT ANTECUBITAL  Final   Special Requests   Final    BOTTLES DRAWN AEROBIC ONLY Blood Culture results may not be optimal due to an inadequate volume of blood received in culture bottles   Culture  Setup Time   Final    AEROBIC BOTTLE ONLY GRAM POSITIVE COCCI IN CLUSTERS CRITICAL VALUE NOTED.  VALUE IS CONSISTENT WITH PREVIOUSLY REPORTED AND CALLED VALUE.    Culture (A)  Final    STAPHYLOCOCCUS AUREUS SUSCEPTIBILITIES PERFORMED ON PREVIOUS CULTURE WITHIN THE LAST 5 DAYS. Performed at [facility], [address] [place], [place] [address]    Report Status 05/01/2020 FINAL  Final  Culture, blood (routine x 2)     Status: Abnormal   Collection Time: 04/29/20  9:59 AM   Specimen: BLOOD RIGHT HAND  Result Value Ref Range Status   Specimen Description BLOOD RIGHT HAND  Final   Special Requests   Final    BOTTLES DRAWN AEROBIC AND ANAEROBIC Blood Culture results may not be optimal due to an inadequate volume of blood received in culture bottles   Culture  Setup Time   Final    IN BOTH AEROBIC AND ANAEROBIC BOTTLES GRAM POSITIVE COCCI IN CLUSTERS CRITICAL VALUE NOTED.  VALUE IS CONSISTENT WITH PREVIOUSLY REPORTED AND CALLED VALUE.    Culture (A)  Final    STAPHYLOCOCCUS AUREUS SUSCEPTIBILITIES PERFORMED ON PREVIOUS CULTURE WITHIN THE LAST 5 DAYS. Performed at [facility], [address] ,  [address]    Report Status 05/01/2020 FINAL  Final         Radiology Studies: No results found.      Scheduled Meds: . buprenorphine-naloxone  1 tablet Sublingual BID  . Chlorhexidine Gluconate Cloth  6 each Topical Q0600  . magnesium oxide  400 mg Oral BID  . QUEtiapine  50 mg Oral BID   Continuous Infusions: . vancomycin 1,000  mg (05/01/20 1419)     LOS: 4 days    Time spent: 40 minutes     , MD [facility]   To contact the attending provider between 7A-7P or the covering provider during after hours 7P-7A, please log into the web site [email or web address] and access using universal  password for that web site. If you do not have the password, please call the hospital operator.  05/01/2020, 4:22 PM   

## 2020-05-01 NOTE — Progress Notes (Signed)
Pharmacy Antibiotic Note  Candice Jordan is a 19 y.o. female admitted on 04/27/2020 with MRSA bacteremia/TV endocarditis and septic emboli.   Pharmacy has been consulted for vancomycin dosing.  Vanc trough 16 on IV vancomycin 1g q8 hours. Trough is ~ 7 hours after last dose, thus true trough is likely very close to goal of 15-20. Pt is improving, WBC down from 16.8 to 13, now afebrile. LFTs improving and coming down. Scr is stable at 0.54.  Plan: Continue vancomycin to 1000mg  IV every 8 hours.  Goal trough 15-20 mcg/mL. Continue to obtain vancomycin levels periodically to ensure pt is therapeutic. Holding off PICC line placement until repeat blood culture negative   Height: 5\' 5"  (165.1 cm) Weight: 54.6 kg (120 lb 5.9 oz) IBW/kg (Calculated) : 57  Temp (24hrs), Avg:98.7 F (37.1 C), Min:98.1 F (36.7 C), Max:99.6 F (37.6 C)  Recent Labs  Lab 04/27/20 0111 04/28/20 0817 04/29/20 0326 04/29/20 2212 04/30/20 0800 05/01/20 0426 05/01/20 1325  WBC 32.5* 17.6* 16.8*  --  16.3* 13.0*  --   CREATININE 0.67 0.65 0.59  --  0.44 0.54  --   LATICACIDVEN 1.5  --   --   --   --   --   --   VANCOTROUGH  --   --   --  8*  --   --  16    Estimated Creatinine Clearance: 96.7 mL/min (by C-G formula based on SCr of 0.54 mg/dL).    No Known Allergies  Antimicrobials this admission: Vancomycin 5/26>> Zosyn 5/25>>5/26  Dose adjustments this admission: Vanc 500mg  Q8 >> trough 8 >> vanc 1g Q8 on 5/28  Microbiology results: 5/25 Bcx (at OSH): GPC in clusters (2/2) -MRSA  5/26BCx: staph aureus 2/2; BCID w/ MRSA 1/4 5/26UCx: neg 5/26MRSA PCR: pos  5/28 Bcx: staph aurus 2/2  Thank you for allowing pharmacy to be a part of this patient's care.  6/28, PharmD PGY1 Acute Care Pharmacy Resident 05/01/2020 2:24 PM

## 2020-05-01 NOTE — Evaluation (Signed)
Physical Therapy Evaluation Patient Details Name: Candice Jordan MRN: [id number] DOB: [birth year] Today's Date: 05/01/2020   History of Present Illness  Patient is a 19 year old female presenting to [facility] ED on 5/25 with reported 1 week of shortness of breath. On arrival to ED CXR revealing of large right sided tension Pneumothorax. Chest Tube placed. 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 No longer requiring chest tube. History of MRSA Bacteremia/Endocarditis. COVID-19. Polysubstance Abuse.     Clinical Impression  Patient admitted with the above listed diagnosis. Patient reports independence with all mobility and ADLs prior to admission. Patient today requiring general supervision level assist for safety and mild gait instability. Patient with limited insight into current medical situation, even with education on need for current treatment as patient states multiple times she wants to leave. PT to follow acutely to address gait stability and high level balance. Likely will not need PT at discharge, however would benefit from social work intervention regarding rehab at discharge. Will follow.     Follow Up Recommendations No PT follow up    Equipment Recommendations  None recommended by PT    Recommendations for Other Services (would benefit from social work re: drug rehab)     Precautions / Restrictions Precautions Precautions: Fall Precaution Comments: sitter present due to IVC Restrictions Weight Bearing Restrictions: No      Mobility  Bed Mobility Overal bed mobility: Needs Assistance Bed Mobility: Supine to Sit     Supine to sit: Supervision     General bed mobility comments: for line management  Transfers Overall transfer level: Needs assistance Equipment used: None Transfers: Sit to/from Stand Sit to Stand: Supervision         General transfer comment: for safety and immediate standing  balance  Ambulation/Gait Ambulation/Gait assistance: Supervision Gait Distance (Feet): 200 Feet Assistive device: None Gait Pattern/deviations: Step-through pattern;Decreased stride length;Drifts right/left Gait velocity: decreased   General Gait Details: mild unsteadiness - does not require hands on assist; Supervision for safety  Stairs            Wheelchair Mobility    Modified Rankin (Stroke Patients Only)       Balance Overall balance assessment: Mild deficits observed, not formally tested                                           Pertinent Vitals/Pain Pain Assessment: No/denies pain    Home Living Family/patient expects to be discharged to:: Private residence Living Arrangements: Parent Available Help at Discharge: Family;Available PRN/intermittently Type of Home: House                Prior Function Level of Independence: Independent               Hand Dominance        Extremity/Trunk Assessment   Upper Extremity Assessment Upper Extremity Assessment: Defer to OT evaluation    Lower Extremity Assessment Lower Extremity Assessment: Generalized weakness    Cervical / Trunk Assessment Cervical / Trunk Assessment: Normal  Communication   Communication: No difficulties  Cognition Arousal/Alertness: Awake/alert Behavior During Therapy: Restless;Anxious Overall Cognitive Status: Impaired/Different from baseline Area of Impairment: Safety/judgement                         Safety/Judgement: Decreased awareness of safety;Decreased awareness  of deficits     General Comments: states multiple times she wants to leave and that she wants to speak to someone who knows whats going on      General Comments      Exercises     Assessment/Plan    PT Assessment Patient needs continued PT services  PT Problem List Decreased strength;Decreased activity tolerance;Decreased balance;Decreased mobility;Decreased safety  awareness;Decreased knowledge of precautions;Cardiopulmonary status limiting activity       PT Treatment Interventions Gait training;Stair training;Functional mobility training;Therapeutic activities;Therapeutic exercise;Balance training;Neuromuscular re-education;Patient/family education    PT Goals (Current goals can be found in the Care Plan section)  Acute Rehab PT Goals Patient Stated Goal: "I want to talk to someone who knows whats going on" "I want to go to a different hospital" PT Goal Formulation: With patient Time For Goal Achievement: 05/15/20 Potential to Achieve Goals: Good    Frequency Min 1X/week   Barriers to discharge        Co-evaluation PT/OT/SLP Co-Evaluation/Treatment: Yes Reason for Co-Treatment: Complexity of the patient's impairments (multi-system involvement);For patient/therapist safety;Necessary to address cognition/behavior during functional activity PT goals addressed during session: Mobility/safety with mobility;Balance         AM-PAC PT "6 Clicks" Mobility  Outcome Measure Help needed turning from your back to your side while in a flat bed without using bedrails?: None Help needed moving from lying on your back to sitting on the side of a flat bed without using bedrails?: None Help needed moving to and from a bed to a chair (including a wheelchair)?: None Help needed standing up from a chair using your arms (e.g., wheelchair or bedside chair)?: A Little Help needed to walk in hospital room?: A Little Help needed climbing 3-5 steps with a railing? : A Little 6 Click Score: 21    End of Session   Activity Tolerance: Patient tolerated treatment well Patient left: in bed;with call bell/phone within reach;with nursing/sitter in room Nurse Communication: Mobility status PT Visit Diagnosis: Unsteadiness on feet (R26.81);Other abnormalities of gait and mobility (R26.89);Muscle weakness (generalized) (M62.81)    Time: 0623-7628 PT Time Calculation  (min) (ACUTE ONLY): 26 min   Charges:   PT Evaluation $PT Eval Moderate Complexity: 1 Mod PT Treatments $Therapeutic Activity: 8-22 mins        Scarlette Ar, PT, DPT Supplemental Physical Therapist 05/01/20 9:38 AM Office: [phone]

## 2020-05-01 NOTE — Evaluation (Signed)
Occupational Therapy Evaluation Patient Details Name: Candice Jordan MRN: [id number] DOB: 2001/04/08 Today's Date: 05/01/2020    History of Present Illness Patient is a 18 year old female presenting to [facility] ED on 5/25 with reported 1 week of shortness of breath. On arrival to ED CXR revealing of large right sided tension Pneumothorax. Chest Tube placed. 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 No longer requiring chest tube. History of MRSA Bacteremia/Endocarditis. COVID-19. Polysubstance Abuse.    Clinical Impression   Pt admitted with above. She demonstrates the below listed deficits and will benefit from continued OT to maximize safety and independence with BADLs.  Pt presents to OT with generalized weakness, decreased activity tolerance, mild balance deficits, and impaired cognition.  She currently requires supervision and min cues for simple ADLs.   Will follow acutely.       Follow Up Recommendations  No OT follow up;Supervision - Intermittent(drug rehab )    Equipment Recommendations  None recommended by OT    Recommendations for Other Services       Precautions / Restrictions Precautions Precautions: Fall Precaution Comments: sitter present due to IVC Restrictions Weight Bearing Restrictions: No      Mobility Bed Mobility Overal bed mobility: Needs Assistance Bed Mobility: Supine to Sit     Supine to sit: Supervision     General bed mobility comments: for line management  Transfers Overall transfer level: Needs assistance Equipment used: None Transfers: Sit to/from Stand Sit to Stand: Supervision         General transfer comment: for safety and immediate standing balance    Balance Overall balance assessment: Mild deficits observed, not formally tested                                         ADL either performed or assessed with clinical judgement   ADL Overall  ADL's : Needs assistance/impaired Eating/Feeding: Independent   Grooming: Wash/dry hands;Wash/dry face;Oral care;Brushing hair;Supervision/safety;Standing Grooming Details (indicate cue type and reason): Pt requires increased time for processing, organization and sequencing.  Cues provided to wipe her mouth after brushing teeth  Upper Body Bathing: Set up;Sitting   Lower Body Bathing: Supervison/ safety;Sit to/from stand   Upper Body Dressing : Set up;Supervision/safety;Sitting   Lower Body Dressing: Supervision/safety;Sit to/from stand   Toilet Transfer: Supervision/safety;Ambulation;Comfort height toilet   Toileting- Clothing Manipulation and Hygiene: Supervision/safety;Sit to/from stand       Functional mobility during ADLs: Supervision/safety       Vision Baseline Vision/History: No visual deficits Patient Visual Report: No change from baseline       Perception     Praxis      Pertinent Vitals/Pain Pain Assessment: No/denies pain     Hand Dominance     Extremity/Trunk Assessment Upper Extremity Assessment Upper Extremity Assessment: Generalized weakness   Lower Extremity Assessment Lower Extremity Assessment: Generalized weakness   Cervical / Trunk Assessment Cervical / Trunk Assessment: Normal   Communication Communication Communication: No difficulties   Cognition Arousal/Alertness: Awake/alert Behavior During Therapy: Restless;Anxious Overall Cognitive Status: Impaired/Different from baseline Area of Impairment: Safety/judgement;Awareness;Attention;Problem solving                   Current Attention Level: Alternating     Safety/Judgement: Decreased awareness of safety;Decreased awareness of deficits Awareness: Intellectual Problem Solving: Slow processing;Difficulty sequencing General Comments: Pt demonstrates poor insight  into deficits.  She knows she a blood stream infection, and infection of her heart, but is unable to state how that  may impact her functionally.  She is very fixated on wanting to leave hospital.  During simple grooming, she requires increased time to organize and sequence tasks.  min cues provided    General Comments  HR to 137 with activity.  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  She did not attend college, and does not work.  She enjoys hanging out with friends         OT Problem List: Decreased strength;Decreased activity tolerance;Impaired balance (sitting and/or standing);Decreased safety awareness;Decreased knowledge of use of DME or AE;Cardiopulmonary status limiting activity;Decreased cognition      OT Treatment/Interventions: Self-care/ADL training;DME and/or AE instruction;Therapeutic activities;Cognitive remediation/compensation;Patient/family education;Balance training    OT Goals(Current goals can be found in the care plan section) Acute Rehab OT Goals Patient Stated Goal: "I want to talk to someone who knows whats going on" "I want to go to a different hospital" OT Goal Formulation: With patient Time For Goal Achievement: 05/16/20 Potential to Achieve Goals: Good ADL Goals Pt Will Perform Grooming: (P) with modified independence;standing Pt Will Perform Upper Body Bathing: (P) with modified independence Pt Will Perform Lower Body Bathing: (P) with modified independence;sit to/from stand Pt Will Perform Upper Body Dressing: (P) with modified  independence;sitting;standing Pt Will Perform Lower Body Dressing: (P) with modified independence;sit to/from stand Pt Will Transfer to Toilet: (P) with modified independence;ambulating;regular height toilet Pt Will Perform Toileting - Clothing Manipulation and hygiene: (P) with modified independence;sit to/from stand Pt/caregiver will Perform Home Exercise Program: (P) Increased strength;Both right and left upper extremity;With theraband;With written HEP provided;Independently Additional ADL Goal #1: (P) Pt will be able to divide and alternate attention while performing novel task or complex path finding with min cues Additional ADL Goal #2: (P) Pt will demonstrates anticipatory awareness of deficits  OT Frequency: Min 2X/week   Barriers to D/C:            Co-evaluation PT/OT/SLP Co-Evaluation/Treatment: Yes Reason for Co-Treatment: For patient/therapist safety;To address functional/ADL transfers PT goals addressed during session: Mobility/safety with mobility;Balance OT goals addressed during session: ADL's and self-care      AM-PAC OT "6 Clicks" Daily Activity     Outcome Measure Help from another person eating meals?: None Help from another person taking care of personal grooming?: A Little Help from another person toileting, which includes using toliet, bedpan, or urinal?: A Little Help from another person bathing (including washing, rinsing, drying)?: A Little Help from another person to put on and taking off regular upper body clothing?: A Little Help from another person to put on and taking off regular lower body clothing?: A Little 6 Click Score: 19   End of Session Equipment Utilized During Treatment: Gait belt  Nurse Communication: Mobility status  Activity Tolerance: Patient tolerated treatment well Patient left: in bed;with call bell/phone within reach;with nursing/sitter in room  OT Visit Diagnosis: Unsteadiness on feet (R26.81);Cognitive communication deficit  (R41.841)                Time: 8341-9622 OT Time Calculation (min): 26 min Charges:  OT General Charges $OT Visit: 1 Visit OT Evaluation $OT Eval Moderate Complexity: 1 Mod  Eber Jones., OTR/L Acute Rehabilitation Services Pager [phone] Office [phone]   Jeani Hawking M 05/01/2020, 10:58 AM

## 2020-05-02 LAB — BASIC METABOLIC PANEL
Anion gap: 9 (ref 5–15)
BUN: 8 mg/dL (ref 6–20)
CO2: 25 mmol/L (ref 22–32)
Calcium: 8.2 mg/dL — ABNORMAL LOW (ref 8.9–10.3)
Chloride: 104 mmol/L (ref 98–111)
Creatinine, Ser: 0.6 mg/dL (ref 0.44–1.00)
GFR calc Af Amer: >60 mL/min (ref >60)
GFR calc non Af Amer: >60 mL/min (ref >60)
Glucose, Bld: 95 mg/dL (ref 70–99)
Potassium: 4 mmol/L (ref 3.5–5.1)
Sodium: 138 mmol/L (ref 135–145)

## 2020-05-02 LAB — CBC WITH DIFFERENTIAL/PLATELET
Abs Immature Granulocytes: 0.09 10*3/uL — ABNORMAL HIGH (ref 0.00–0.07)
Basophils Absolute: 0 10*3/uL (ref 0.0–0.1)
Basophils Relative: 0 %
Eosinophils Absolute: 0.1 10*3/uL (ref 0.0–0.5)
Eosinophils Relative: 1 %
HCT: 24.8 % — ABNORMAL LOW (ref 36.0–46.0)
Hemoglobin: 7.4 g/dL — ABNORMAL LOW (ref 12.0–15.0)
Immature Granulocytes: 1 %
Lymphocytes Relative: 36 %
Lymphs Abs: 3.9 10*3/uL (ref 0.7–4.0)
MCH: 27.3 pg (ref 26.0–34.0)
MCHC: 29.8 g/dL — ABNORMAL LOW (ref 30.0–36.0)
MCV: 91.5 fL (ref 80.0–100.0)
Monocytes Absolute: 0.7 10*3/uL (ref 0.1–1.0)
Monocytes Relative: 6 %
Neutro Abs: 6.2 10*3/uL (ref 1.7–7.7)
Neutrophils Relative %: 56 %
Platelets: 216 10*3/uL (ref 150–400)
RBC: 2.71 MIL/uL — ABNORMAL LOW (ref 3.87–5.11)
RDW: 19.3 % — ABNORMAL HIGH (ref 11.5–15.5)
WBC: 11 10*3/uL — ABNORMAL HIGH (ref 4.0–10.5)
nRBC: 0 % (ref 0.0–0.2)

## 2020-05-02 LAB — TROPONIN I (HIGH SENSITIVITY): Troponin I (High Sensitivity): 4 ng/L (ref <18)

## 2020-05-02 LAB — MAGNESIUM: Magnesium: 1.7 mg/dL (ref 1.7–2.4)

## 2020-05-02 MED ORDER — MAGNESIUM SULFATE 4 GM/100ML IV SOLN
4.0000 g | Freq: Once | INTRAVENOUS | Status: AC
  Administered 2020-05-02: 4 g via INTRAVENOUS
  Filled 2020-05-02: qty 100

## 2020-05-02 MED ORDER — OXYCODONE HCL 5 MG PO TABS: 5.0000 mg | ORAL_TABLET | ORAL | Status: DC | PRN

## 2020-05-02 MED ORDER — LIDOCAINE 5 % EX PTCH
1.0000 | MEDICATED_PATCH | CUTANEOUS | Status: DC
  Administered 2020-05-02 – 2020-05-04 (×2): 1 via TRANSDERMAL
  Filled 2020-05-02 (×16): qty 1

## 2020-05-02 MED ORDER — NITROGLYCERIN 0.4 MG SL SUBL
SUBLINGUAL_TABLET | SUBLINGUAL | Status: AC
  Administered 2020-05-02: 0.4 mg
  Filled 2020-05-02: qty 1

## 2020-05-02 MED ORDER — GUAIFENESIN 100 MG/5ML PO SOLN
5.0000 mL | ORAL | Status: DC | PRN
  Administered 2020-05-02: 100 mg via ORAL
  Filled 2020-05-02: qty 5

## 2020-05-02 MED ORDER — NITROGLYCERIN 0.4 MG SL SUBL
0.4000 mg | SUBLINGUAL_TABLET | SUBLINGUAL | Status: DC | PRN
  Administered 2020-05-02 (×2): 0.4 mg via SUBLINGUAL

## 2020-05-02 NOTE — Progress Notes (Signed)
   05/02/20 0840  Assess: MEWS Score  Temp 98.6 F (37 C)  BP 131/84  Pulse Rate (!) 124  Resp (!) 27  Level of Consciousness Alert  SpO2 98 %  O2 Device Room Air  Assess: MEWS Score  MEWS Temp 0  MEWS Systolic 0  MEWS Pulse 2  MEWS RR 2  MEWS LOC 0  MEWS Score 4  MEWS Score Color Red  Assess: if the MEWS score is Yellow or Red  Were vital signs taken at a resting state? Yes  Focused Assessment Documented focused assessment  Early Detection of Sepsis Score *See Row Information* Low  MEWS guidelines implemented *See Row Information* No, previously red, continue vital signs every 4 hours  Treat  MEWS Interventions Administered scheduled meds/treatments

## 2020-05-02 NOTE — Progress Notes (Addendum)
Received page that patient was having chest pain. Reviewed chart. Patient was given Nitro with some improvement in pain. Pain located over anterior chest and reproducible with palpation. EKG done but not yet uploaded in system. Will draw Trops as well. Okay to give PRN Metoprolol for tachycardia as previously ordered. Very low suspicion for ACS. Per RN, patient is agitated so RN will try Haldol as ordered. Also having pain when coughing, will trial Mucinex as well.   Jerilynn Birkenhead, MD [facility]

## 2020-05-02 NOTE — Progress Notes (Signed)
PROGRESS NOTE    Candice Jordan  YIR:[id number] DOB: 03/10/2001 DOA: 04/27/2020 PCP: System, Provider Not In    No chief complaint on file.   Brief Narrative:  19 year old female presents to [facility] ED on 5/25 with reported 1 week of shortness of breath. Patient was found at [address] with oxygen saturation 79%. Reports using Heroin last night. Diagnosed with MRSA Bacteremia/Endocarditis and COVID 03/08/2020 however has left AMA numerous times throughout the month of April. On arrival to ED. CXR revealing of large right sided tension Pneumothorax. Chest Tube placed. 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  Patient met criteria for sepsis on admission with tachypnea, tachycardia, leukocytosis, concern for untreated bacteremia.  Repeat 2D echo which was done showed worsening/severe tricuspid valvular regurgitation, tricuspid valve vegetation attached to posterior leaflet measuring 0.9 cm x 0.5 cm more apparent on this study the last study.  Severely dilated right atrial size.  Hyperdynamic right ventricular systolic function.  Continue empiric IV vancomycin.  CT surgery was consulted 04/28/2020 for possible valve repair however due to ongoing IV drug abuse patient at high risk for reinfection if she were to undergo valve replacement and recommending focusing on drug  rehabilitation.  Per CT surgery obtaining a TEE will not change surgical candidacy and recommending medical management for now.  Repeat blood cultures from 04/29/2028 + for staph aureus.  Continue IV vancomycin.  ID following.  2.  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  Chest tube removed 04/28/2020 per PCCM.  Repeat chest x-ray done showed no recurrent pneumothorax.  Pulmonary hygiene.  Supportive care.  3.  Polysubstance abuse/heroin use UDS noted at outside hospital to be positive for amphetamines. Patient noted to have multiple admissions for endocarditis, where she has left AMA . Patient seen by psychiatry on 04/27/2020 and is felt patient lacks capacity to make an informed medical decision regarding her medical care and disposition and recommending social work evaluation to contact next of kin no appropriate hospital administrator to help with decision-making process.  Continue Suboxone.  Was on clonidine taper/detox protocol.  Patient wanted to leave AMA again (04/29/2020).  Patient however did state she understands the risk of leaving AMA which include death.  Was also felt by PCCM that patient lacked capacity poor insight into her current medical situation.  Patient with poor insight into her current medical situation.  Patient not thinking rationally.  Patient has subsequently been IVC'd to finish out treatment for MRSA disseminated infection/endocarditis with worsening tricuspid regurgitation.  Patient deemed not a surgical candidate due to ongoing IV drug use.  Patient has been reassessed by psych who are in agreement that patient lacks capacity.    4.  Anemia of chronic disease  Anemia panel consistent with anemia of chronic disease.  Status post transfusion 1 unit packed red blood cells.  Hemoglobin  currently at 7.4 from 7.2 from 7.9 from 7.0.  DIC panel negative for schistocytes. Transfusion threshold hemoglobin <7.  5.  Tachycardia Likely secondary to problems # 1,2,3,4.  Continue treatment with IV antibiotics.  Patient s/p transfusion 1 unit of packed red blood cells.  Lopressor as needed for heart rate > 130.  Follow.  6.  Hypomagnesemia/hypokalemia Magnesium at 1.7 this morning.  Magnesium sulfate 4 g IV x1.  Continue oral magnesium supplementation.  Potassium of 4.0.   7.  Positive hep C antibody/transaminitis HCV RNA quant of 2260.  Outpatient follow-up with ID.     DVT prophylaxis: SCDs Code Status: Full Family Communication: Updated patient.  No family at bedside. Disposition:   Status is: Inpatient    Dispo: The patient is from: Home              Anticipated d/c is to: To be determined              Anticipated d/c date is: Once IV antibiotics have been completed.  Needs antibiotics for 6 weeks.              Patient currently on IV antibiotics, with tricuspid endocarditis, being followed by ID.  Patient has now been IVC'D.  Patient currently not safe to be discharged home as patient has left multiple times AMA and now has worsening tricuspid regurg with tricuspid endocarditis.        Consultants:   ID: Dr. Baxter Flattery 04/28/2020  Psychiatry: Dr. Astrid Divine 5/26-27/2021  CT surgery: Dr. Kipp Brood  Procedures:   Chest x-ray 04/27/2020, 04/28/2020  2D echo 04/27/2020----TV endocarditis with progression of TR that is now severe.  TEE recommended.  Lower extremity Dopplers 04/28/2020  Right-sided chest tube 04/26/2020>>>> 04/28/2020  Antimicrobials:   IV Zosyn x1 dose.  IV vancomycin 04/27/2020>>>>  Micro Data:  Blood 5/26 >> Urine 5/26 >> MRSA PCR 5/26 >> Positive     Subjective: Patient sleeping but arousable.  Denies chest pain.  No shortness of breath.     Objective: Vitals:   05/02/20 0006 05/02/20 0500 05/02/20 0702 05/02/20 0840  BP: (!) 128/95  129/69  131/84  Pulse: (!) 122 (!) 116  (!) 124  Resp: (!) 26 (!) 28  (!) 27  Temp: 99.4 F (37.4 C) 99.3 F (37.4 C)  98.6 F (37 C)  TempSrc: Oral Oral  Oral  SpO2: 98% 98%  98%  Weight:   57.5 kg   Height:        Intake/Output Summary (Last 24 hours) at 05/02/2020 1122 Last data filed at 05/02/2020 1100 Gross per 24 hour  Intake 1226.09 ml  Output --  Net 1226.09 ml   Filed Weights   04/28/20 2056 04/30/20 0600 05/02/20 0702  Weight: 51.8 kg 54.6 kg 57.5 kg    Examination:  General exam: NAD Respiratory system: CTA B.  No wheezes, no crackles, no rhonchi.  Normal respiratory effort.  Speaking in full sentences.   Cardiovascular system: Tachycardia.  No JVD.  No lower extremity edema. Gastrointestinal system: Abdomen is nontender, nondistended, soft, positive bowel sounds.  No rebound.  No guarding.  Central nervous system: Alert and oriented. No focal neurological deficits. Extremities: Symmetric 5 x 5 power. Skin: No rashes, lesions or ulcers Psychiatry: Judgement and insight appear normal. Mood & affect appropriate.     Data Reviewed: I have personally reviewed following labs  and imaging studies  CBC: Recent Labs  Lab 04/27/20 0111 04/27/20 0219 04/28/20 0817 04/28/20 0817 04/29/20 0326 04/29/20 1422 04/30/20 0800 05/01/20 0426 05/02/20 0344  WBC 32.5*   < > 17.6*  --  16.8*  --  16.3* 13.0* 11.0*  NEUTROABS 28.9*  --   --   --   --   --  11.6* 8.4* 6.2  HGB 8.2*   < > 7.7*  --  7.0*  --  7.9* 7.2* 7.4*  HCT 26.8*   < > 26.1*  --  24.1*  --  25.7* 23.7* 24.8*  MCV 86.7   < > 89.4  --  90.9  --  90.8 90.5 91.5  PLT 273  266   < > 222   < > 233 267 210 191 216   < > = values in this interval not displayed.    Basic Metabolic Panel: Recent Labs  Lab 04/27/20 0111 04/27/20 0219 04/28/20 0817 04/28/20 2103 04/29/20 0326 04/29/20 0830 04/30/20 0800 05/01/20 0426 05/02/20 0344  NA 131*   < > 139  --  140  --  138 139 138  K 3.7   < > 3.0*  --   2.9*  --  4.0 4.2 4.0  CL 102   < > 110  --  113*  --  111 110 104  CO2 19*   < > 20*  --  18*  --  19* 23 25  GLUCOSE 99   < > 109*  --  121*  --  84 91 95  BUN 18   < > 6  --  7  --  _0 CREATININE 0.67   < > 0.65  --  0.59  --  0.44 0.54 0.60  CALCIUM 7.8*   < > 8.0*  --  7.7*  --  8.0* 7.9* 8.2*  MG 1.6*   < > 1.9  --   --  1.7 1.7 1.8 1.7  PHOS 2.3*  --  1.5* 2.7  --   --   --   --   --    < > = values in this interval not displayed.    GFR: Estimated Creatinine Clearance: 100.9 mL/min (by C-G formula based on SCr of 0.6 mg/dL).  Liver Function Tests: Recent Labs  Lab 04/27/20 0111 04/29/20 0326 04/30/20 0800 05/01/20 0426  AST 167* 85* 51* 63*  ALT 102* 102* 80* 79*  ALKPHOS 139* 100 108 128*  BILITOT 1.4* 0.6 0.8 0.5  PROT 7.6 6.5 6.8 6.7  ALBUMIN 1.8* 1.8* 1.8* 1.8*    CBG: Recent Labs  Lab 04/27/20 0414 04/27/20 0803 04/27/20 2340 04/28/20 0337 04/28/20 0813  GLUCAP 135* 126* 123* 130* 98     Recent Results (from the past 240 hour(s))  MRSA PCR Screening     Status: Abnormal   Collection Time: 04/27/20  1:15 AM   Specimen: Nasopharyngeal  Result Value Ref Range Status   MRSA by PCR POSITIVE (A) NEGATIVE Final    Comment: CRITICAL RESULT CALLED TO, READ BACK BY AND VERIFIED WITH: RN [facility] MUELLER [id number]  THANEY   Culture, blood (routine x 2)     Status: Abnormal (Preliminary result)   Collection Time: 04/27/20  1:23 AM   Specimen: BLOOD  Result Value Ref Range Status   Specimen Description BLOOD LEFT ARM  Final   Special Requests   Final    BOTTLES DRAWN AEROBIC AND ANAEROBIC Blood Culture adequate volume  Culture  Setup Time   Final    IN BOTH AEROBIC AND ANAEROBIC BOTTLES GRAM POSITIVE COCCI IN CLUSTERS CRITICAL RESULT CALLED TO, READ BACK BY AND VERIFIED WITH: K AMEND [facility] 04/29/20 0117 JDW Performed at [facility], [place] [address] [place], [place] [address]    Culture METHICILLIN RESISTANT STAPHYLOCOCCUS AUREUS (A)  Final     Report Status PENDING  Incomplete   Organism ID, Bacteria METHICILLIN RESISTANT STAPHYLOCOCCUS AUREUS  Final      Susceptibility   Methicillin resistant staphylococcus aureus - MIC*    CIPROFLOXACIN >=8 RESISTANT Resistant     ERYTHROMYCIN >=8 RESISTANT Resistant     GENTAMICIN <=0.5 SENSITIVE Sensitive     OXACILLIN >=4 RESISTANT Resistant     TETRACYCLINE <=1 SENSITIVE Sensitive     VANCOMYCIN <=0.5 SENSITIVE Sensitive     TRIMETH/SULFA <=10 SENSITIVE Sensitive     CLINDAMYCIN <=0.25 SENSITIVE Sensitive     RIFAMPIN <=0.5 SENSITIVE Sensitive     Inducible Clindamycin NEGATIVE Sensitive     * METHICILLIN RESISTANT STAPHYLOCOCCUS AUREUS  Blood Culture ID Panel (Reflexed)     Status: Abnormal   Collection Time: 04/27/20  1:23 AM  Result Value Ref Range Status   Enterococcus species NOT DETECTED NOT DETECTED Final   Listeria monocytogenes NOT DETECTED NOT DETECTED Final   Staphylococcus species DETECTED (A) NOT DETECTED Final    Comment: CRITICAL RESULT CALLED TO, READ BACK BY AND VERIFIED WITH: K AMEND [facility] 04/29/20 0117 JDW    Staphylococcus aureus (BCID) DETECTED (A) NOT DETECTED Final    Comment: Methicillin (oxacillin)-resistant Staphylococcus aureus (MRSA). MRSA is predictably resistant to beta-lactam antibiotics (except ceftaroline). Preferred therapy is vancomycin unless clinically contraindicated. Patient requires contact precautions if  hospitalized. CRITICAL RESULT CALLED TO, READ BACK BY AND VERIFIED WITH: K AMEND [facility] 04/29/20 0117 JDW    Methicillin resistance DETECTED (A) NOT DETECTED Final    Comment: CRITICAL RESULT CALLED TO, READ BACK BY AND VERIFIED WITH: K AMEND [facility] 04/29/20 0117    Streptococcus species NOT DETECTED NOT DETECTED Final   Streptococcus agalactiae NOT DETECTED NOT DETECTED Final   Streptococcus pneumoniae NOT DETECTED NOT DETECTED Final   Streptococcus pyogenes NOT DETECTED NOT DETECTED Final   Acinetobacter baumannii NOT DETECTED NOT  DETECTED Final   Enterobacteriaceae species NOT DETECTED NOT DETECTED Final   Enterobacter cloacae complex NOT DETECTED NOT DETECTED Final   Escherichia coli NOT DETECTED NOT DETECTED Final   Klebsiella oxytoca NOT DETECTED NOT DETECTED Final   Klebsiella pneumoniae NOT DETECTED NOT DETECTED Final   Proteus species NOT DETECTED NOT DETECTED Final   Serratia marcescens NOT DETECTED NOT DETECTED Final   Haemophilus influenzae NOT DETECTED NOT DETECTED Final   Neisseria meningitidis NOT DETECTED NOT DETECTED Final   Pseudomonas aeruginosa NOT DETECTED NOT DETECTED Final   Candida albicans NOT DETECTED NOT DETECTED Final   Candida glabrata NOT DETECTED NOT DETECTED Final   Candida krusei NOT DETECTED NOT DETECTED Final   Candida parapsilosis NOT DETECTED NOT DETECTED Final   Candida tropicalis NOT DETECTED NOT DETECTED Final    Comment: Performed at [facility], [address], [place], [place] [address]  Culture, blood (routine x 2)     Status: Abnormal   Collection Time: 04/27/20  1:24 AM   Specimen: BLOOD  Result Value Ref Range Status   Specimen Description BLOOD LEFT FOREARM  Final   Special Requests   Final    BOTTLES DRAWN AEROBIC AND ANAEROBIC Blood Culture  adequate volume   Culture  Setup Time   Final    GRAM POSITIVE COCCI IN BOTH AEROBIC AND ANAEROBIC BOTTLES CRITICAL VALUE NOTED.  VALUE IS CONSISTENT WITH PREVIOUSLY REPORTED AND CALLED VALUE.    Culture (A)  Final    STAPHYLOCOCCUS AUREUS SUSCEPTIBILITIES PERFORMED ON PREVIOUS CULTURE WITHIN THE LAST 5 DAYS. Performed at [facility], [place] [address] [place], [place] [address]    Report Status 04/30/2020 FINAL  Final  Culture, Urine     Status: None   Collection Time: 04/27/20  3:36 PM   Specimen: Urine, Random  Result Value Ref Range Status   Specimen Description URINE, RANDOM  Final   Special Requests NONE  Final   Culture   Final    NO GROWTH Performed at [facility], [place] [address],  [place], [place] [address]    Report Status 04/28/2020 FINAL  Final  Culture, blood (routine x 2)     Status: Abnormal   Collection Time: 04/29/20  9:55 AM   Specimen: BLOOD  Result Value Ref Range Status   Specimen Description BLOOD RIGHT ANTECUBITAL  Final   Special Requests   Final    BOTTLES DRAWN AEROBIC ONLY Blood Culture results may not be optimal due to an inadequate volume of blood received in culture bottles   Culture  Setup Time   Final    AEROBIC BOTTLE ONLY GRAM POSITIVE COCCI IN CLUSTERS CRITICAL VALUE NOTED.  VALUE IS CONSISTENT WITH PREVIOUSLY REPORTED AND CALLED VALUE.    Culture (A)  Final    STAPHYLOCOCCUS AUREUS SUSCEPTIBILITIES PERFORMED ON PREVIOUS CULTURE WITHIN THE LAST 5 DAYS. Performed at [facility], [place] [address], [place], [place] [address]    Report Status 05/01/2020 FINAL  Final  Culture, blood (routine x 2)     Status: Abnormal   Collection Time: 04/29/20  9:59 AM   Specimen: BLOOD RIGHT HAND  Result Value Ref Range Status   Specimen Description BLOOD RIGHT HAND  Final   Special Requests   Final    BOTTLES DRAWN AEROBIC AND ANAEROBIC Blood Culture results may not be optimal due to an inadequate volume of blood received in culture bottles   Culture  Setup Time   Final    IN BOTH AEROBIC AND ANAEROBIC BOTTLES GRAM POSITIVE COCCI IN CLUSTERS CRITICAL VALUE NOTED.  VALUE IS CONSISTENT WITH PREVIOUSLY REPORTED AND CALLED VALUE.    Culture (A)  Final    STAPHYLOCOCCUS AUREUS SUSCEPTIBILITIES PERFORMED ON PREVIOUS CULTURE WITHIN THE LAST 5 DAYS. Performed at [facility], [place] [address], [place], [place] [address]    Report Status 05/01/2020 FINAL  Final         Radiology Studies: No results found.      Scheduled Meds: . buprenorphine-naloxone  1 tablet Sublingual BID  . Chlorhexidine Gluconate Cloth  6 each Topical Q0600  . magnesium oxide  400 mg Oral BID  . QUEtiapine  50 mg Oral BID   Continuous Infusions: . sodium chloride  10 mL (05/02/20 0629)  . vancomycin 1,000 mg (05/02/20 0630)     LOS: 5 days    Time spent: 40 minutes    Irine Seal, MD [facility]   To contact the attending provider between 7A-7P or the covering provider during after hours 7P-7A, please log into the web site [email or web address] and access using universal [place] password for that web site. If you do not have the password, please call the hospital  operator.  05/02/2020, 11:22 AM

## 2020-05-03 LAB — CBC WITH DIFFERENTIAL/PLATELET
Abs Immature Granulocytes: 0.05 10*3/uL (ref 0.00–0.07)
Basophils Absolute: 0.1 10*3/uL (ref 0.0–0.1)
Basophils Relative: 1 %
Eosinophils Absolute: 0.1 10*3/uL (ref 0.0–0.5)
Eosinophils Relative: 1 %
HCT: 27.2 % — ABNORMAL LOW (ref 36.0–46.0)
Hemoglobin: 8.2 g/dL — ABNORMAL LOW (ref 12.0–15.0)
Immature Granulocytes: 1 %
Lymphocytes Relative: 39 %
Lymphs Abs: 4 10*3/uL (ref 0.7–4.0)
MCH: 27.8 pg (ref 26.0–34.0)
MCHC: 30.1 g/dL (ref 30.0–36.0)
MCV: 92.2 fL (ref 80.0–100.0)
Monocytes Absolute: 0.7 10*3/uL (ref 0.1–1.0)
Monocytes Relative: 7 %
Neutro Abs: 5.3 10*3/uL (ref 1.7–7.7)
Neutrophils Relative %: 51 %
Platelets: 210 10*3/uL (ref 150–400)
RBC: 2.95 MIL/uL — ABNORMAL LOW (ref 3.87–5.11)
RDW: 19.1 % — ABNORMAL HIGH (ref 11.5–15.5)
WBC: 10.2 10*3/uL (ref 4.0–10.5)
nRBC: 0 % (ref 0.0–0.2)

## 2020-05-03 LAB — CULTURE, BLOOD (ROUTINE X 2): Special Requests: ADEQUATE

## 2020-05-03 LAB — BASIC METABOLIC PANEL
Anion gap: 11 (ref 5–15)
BUN: 8 mg/dL (ref 6–20)
CO2: 24 mmol/L (ref 22–32)
Calcium: 8.1 mg/dL — ABNORMAL LOW (ref 8.9–10.3)
Chloride: 101 mmol/L (ref 98–111)
Creatinine, Ser: 0.64 mg/dL (ref 0.44–1.00)
GFR calc Af Amer: >60 mL/min (ref >60)
GFR calc non Af Amer: >60 mL/min (ref >60)
Glucose, Bld: 101 mg/dL — ABNORMAL HIGH (ref 70–99)
Potassium: 4.2 mmol/L (ref 3.5–5.1)
Sodium: 136 mmol/L (ref 135–145)

## 2020-05-03 LAB — TROPONIN I (HIGH SENSITIVITY): Troponin I (High Sensitivity): 4 ng/L (ref <18)

## 2020-05-03 LAB — MAGNESIUM: Magnesium: 1.7 mg/dL (ref 1.7–2.4)

## 2020-05-03 MED ORDER — HYDROCODONE-HOMATROPINE 5-1.5 MG/5ML PO SYRP
5.0000 mL | ORAL_SOLUTION | Freq: Four times a day (QID) | ORAL | Status: DC | PRN
  Administered 2020-05-03 – 2020-05-08 (×14): 5 mL via ORAL
  Filled 2020-05-03 (×14): qty 5

## 2020-05-03 MED ORDER — OXYCODONE HCL 5 MG PO TABS
5.0000 mg | ORAL_TABLET | ORAL | Status: AC | PRN
  Administered 2020-05-03 – 2020-05-04 (×3): 5 mg via ORAL
  Filled 2020-05-03 (×3): qty 1

## 2020-05-03 MED ORDER — HYDROXYZINE HCL 25 MG PO TABS
25.0000 mg | ORAL_TABLET | Freq: Four times a day (QID) | ORAL | Status: AC | PRN
  Administered 2020-05-04 – 2020-05-06 (×2): 25 mg via ORAL
  Filled 2020-05-03 (×3): qty 1

## 2020-05-03 MED ORDER — DM-GUAIFENESIN ER 30-600 MG PO TB12
1.0000 | ORAL_TABLET | Freq: Two times a day (BID) | ORAL | Status: DC
  Administered 2020-05-03 – 2020-05-26 (×47): 1 via ORAL
  Filled 2020-05-03 (×47): qty 1

## 2020-05-03 MED ORDER — CHLORHEXIDINE GLUCONATE CLOTH 2 % EX PADS
6.0000 | MEDICATED_PAD | Freq: Every day | CUTANEOUS | Status: AC
  Administered 2020-05-03 – 2020-05-07 (×3): 6 via TOPICAL

## 2020-05-03 MED ORDER — NAPROXEN 250 MG PO TABS
500.0000 mg | ORAL_TABLET | Freq: Two times a day (BID) | ORAL | Status: AC | PRN
  Filled 2020-05-03: qty 2

## 2020-05-03 MED ORDER — CARVEDILOL 3.125 MG PO TABS
3.1250 mg | ORAL_TABLET | Freq: Two times a day (BID) | ORAL | Status: DC
  Administered 2020-05-03 – 2020-05-14 (×24): 3.125 mg via ORAL
  Filled 2020-05-03 (×25): qty 1

## 2020-05-03 MED ORDER — LOPERAMIDE HCL 2 MG PO CAPS: 2.0000 mg | ORAL_CAPSULE | ORAL | Status: AC | PRN

## 2020-05-03 MED ORDER — ONDANSETRON 4 MG PO TBDP
4.0000 mg | ORAL_TABLET | Freq: Four times a day (QID) | ORAL | Status: AC | PRN
  Filled 2020-05-03: qty 1

## 2020-05-03 MED ORDER — MUPIROCIN 2 % EX OINT
1.0000 "application " | TOPICAL_OINTMENT | Freq: Two times a day (BID) | CUTANEOUS | Status: AC
  Administered 2020-05-03 – 2020-05-07 (×8): 1 via NASAL
  Filled 2020-05-03 (×3): qty 22

## 2020-05-03 MED ORDER — ALBUTEROL SULFATE (2.5 MG/3ML) 0.083% IN NEBU
2.5000 mg | INHALATION_SOLUTION | RESPIRATORY_TRACT | Status: DC | PRN
  Filled 2020-05-03: qty 3

## 2020-05-03 MED ORDER — METHOCARBAMOL 500 MG PO TABS: 500.0000 mg | ORAL_TABLET | Freq: Three times a day (TID) | ORAL | Status: AC | PRN

## 2020-05-03 MED ORDER — DICYCLOMINE HCL 20 MG PO TABS: 20.0000 mg | ORAL_TABLET | Freq: Four times a day (QID) | ORAL | Status: AC | PRN

## 2020-05-03 NOTE — Progress Notes (Signed)
Patient HR sustaining above 130bpm, ST, BP 119/68.  Given lopressor 5mg  IV per prn order, HR decreased below 130bpm.

## 2020-05-03 NOTE — Progress Notes (Signed)
Patient's RN informed me that the patient had a new order for Albuterol and she may need a treatment now. Upon assessing the patient, her BBS are clear and she is sleeping. She is in no respiratory distress and there are no indications of bronchospasm at this time. RT will continue to monitor and return if needed.

## 2020-05-03 NOTE — Progress Notes (Signed)
PROGRESS NOTE    Candice Jordan  QZR:[id number] DOB: [birth year] DOA: 04/27/2020 PCP: System, Provider Not In    No chief complaint on file.   Brief Narrative:  19 year old female presents to [facility] ED on 5/25 with reported 1 week of shortness of breath. Patient was found at [address] with oxygen saturation 79%. Reports using Heroin last night. Diagnosed with MRSA Bacteremia/Endocarditis and COVID 03/08/2020 however has left AMA numerous times throughout the month of April. On arrival to ED. CXR revealing of large right sided tension Pneumothorax. Chest Tube placed. 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  Patient met criteria for sepsis on admission with tachypnea, tachycardia, leukocytosis, concern for untreated bacteremia.  Repeat 2D echo which was done showed worsening/severe tricuspid valvular regurgitation, tricuspid valve vegetation attached to posterior leaflet measuring 0.9 cm x 0.5 cm more apparent on this study the last study.  Severely dilated right atrial size.  Hyperdynamic right ventricular systolic function.  Continue empiric IV vancomycin.  CT surgery was consulted 04/28/2020 for possible valve repair however due to ongoing IV drug abuse patient at high risk for reinfection if she were to undergo valve replacement and recommending focusing on drug  rehabilitation.  Per CT surgery obtaining a TEE will not change surgical candidacy and recommending medical management for now.  Repeat blood cultures from 04/29/2028 + for staph aureus.  We will repeat blood cultures x2 today.  Continue IV vancomycin.  ID following.  2.  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  Chest tube removed 04/28/2020 per PCCM.  Repeat chest x-ray done showed no recurrent pneumothorax.  Pulmonary hygiene.  Mucinex added due to ongoing cough.  Place on Hycodan as needed.  Supportive care.  3.  Polysubstance abuse/heroin use UDS noted at outside hospital to be positive for amphetamines. Patient noted to have multiple admissions for endocarditis, where she has left AMA . Patient seen by psychiatry on 04/27/2020 and is felt patient lacks capacity to make an informed medical decision regarding her medical care and disposition and recommending social work evaluation to contact next of kin no appropriate hospital administrator to help with decision-making process.  Continue Suboxone.  Was on clonidine taper/detox protocol.  Patient wanted to leave AMA again (04/29/2020).  Patient however did state she understands the risk of leaving AMA which include death.  Was also felt by PCCM that patient lacked capacity poor insight into her current medical situation.  Patient with poor insight into her current medical situation.  Patient not thinking rationally.  Patient has subsequently been IVC'd to finish out treatment for MRSA disseminated infection/endocarditis with worsening tricuspid regurgitation.  Patient deemed not a surgical candidate due to ongoing IV drug use.  Patient has  been reassessed by psych who are in agreement that patient lacks capacity.    4.  Anemia of chronic disease Anemia panel  consistent with anemia of chronic disease.  Status post transfusion 1 unit packed red blood cells.  Hemoglobin currently at 8.2 from 7.4 from 7.2 from 7.9 from 7.0.  DIC panel negative for schistocytes. Transfusion threshold hemoglobin <7.  5.  Tachycardia Likely secondary to problems # 1,2,3,4.  Continue treatment with IV antibiotics.  Patient s/p transfusion 1 unit of packed red blood cells.  Lopressor as needed for heart rate > 130.  Will place on Coreg 3.125 mg p.o. twice daily.  Follow.  6.  Hypomagnesemia/hypokalemia Magnesium at 1.7 this morning.  We will give magnesium sulfate 4 g IV x1.  Continue oral supplementation.  Potassium of 4.2.  Follow.  7.  Positive hep C antibody/transaminitis HCV RNA quant of 2260.  Outpatient follow-up with ID.     DVT prophylaxis: SCDs Code Status: Full Family Communication: Updated patient.  No family at bedside. Disposition:   Status is: Inpatient    Dispo: The patient is from: Home              Anticipated d/c is to: To be determined              Anticipated d/c date is: Once IV antibiotics have been completed.  Needs antibiotics for 6 weeks.  When cleared by ID.              Patient currently on IV antibiotics, with tricuspid endocarditis, being followed by ID.  Patient has now been IVC'D.  Patient currently not safe to be discharged home as patient has left multiple times AMA and now has worsening tricuspid regurg with tricuspid endocarditis with interrupted treatments.  Patient at high risk for decompensation..        Consultants:   ID: Dr. Baxter Flattery 04/28/2020  Psychiatry: Dr. Astrid Divine 5/26-27/2021  CT surgery: Dr. Kipp Brood  Procedures:   Chest x-ray 04/27/2020, 04/28/2020  2D echo 04/27/2020----TV endocarditis with progression of TR that is now severe.  TEE recommended.  Lower extremity Dopplers 04/28/2020  Right-sided chest tube 04/26/2020>>>> 04/28/2020  Antimicrobials:   IV Zosyn x1 dose.  IV vancomycin  04/27/2020>>>>  Micro Data:  Blood 5/26 >> Urine 5/26 >> MRSA PCR 5/26 >> Positive     Subjective: Patient sleeping but arousable.  Somewhat drowsy.  Warm to the touch.  Denies chest pain.  No shortness of breath.  Complains of coughing.   Objective: Vitals:   05/03/20 0932 05/03/20 1019 05/03/20 1042 05/03/20 1100  BP: 119/68 100/67 101/64 109/69  Pulse: (!) 132 (!) 117 (!) 123 (!) 124  Resp:      Temp:      TempSrc:      SpO2: 93% 100% 100% 100%  Weight:      Height:        Intake/Output Summary (Last 24 hours) at 05/03/2020 1133 Last data filed at 05/03/2020 1100 Gross per 24 hour  Intake 1890.32 ml  Output 220 ml  Net 1670.32 ml   Filed Weights   04/30/20 0600 05/02/20 0702 05/03/20 0640  Weight: 54.6 kg 57.5 kg 52 kg    Examination:  General exam: NAD Respiratory system: Lungs clear to auscultation bilaterally.  No wheezes, no crackles, no rhonchi.  Normal respiratory effort.  Speaking in full sentences. Cardiovascular system: Tachycardia.  No JVD.  No lower extremity edema.  Gastrointestinal system: Abdomen is soft, nontender, nondistended, positive bowel sounds.  No rebound.  No guarding.  Central nervous system: Alert and oriented. No focal neurological deficits. Extremities: Symmetric 5 x 5 power. Skin: No rashes, lesions or ulcers Psychiatry: Judgement and insight appear normal. Mood & affect appropriate.     Data Reviewed: I have personally reviewed following labs and imaging studies  CBC: Recent Labs  Lab 04/27/20 0111 04/27/20 0219 04/29/20 0326 04/29/20 0326 04/29/20 1422 04/30/20 0800 05/01/20 0426 05/02/20 0344 05/03/20 0237  WBC 32.5*   < > 16.8*  --   --  16.3* 13.0* 11.0* 10.2  NEUTROABS 28.9*  --   --   --   --  11.6* 8.4* 6.2 5.3  HGB 8.2*   < > 7.0*  --   --  7.9* 7.2* 7.4* 8.2*  HCT 26.8*   < > 24.1*  --   --  25.7* 23.7* 24.8* 27.2*  MCV 86.7   < > 90.9  --   --  90.8 90.5 91.5 92.2  PLT 273  266   < > 233   < > 267 210 191 216  210   < > = values in this interval not displayed.    Basic Metabolic Panel: Recent Labs  Lab 04/27/20 0111 04/27/20 0219 04/28/20 0817 04/28/20 0817 04/28/20 2103 04/29/20 0326 04/29/20 0830 04/30/20 0800 05/01/20 0426 05/02/20 0344 05/03/20 0237  NA 131*   < > 139   < >  --  140  --  138 139 138 136  K 3.7   < > 3.0*   < >  --  2.9*  --  4.0 4.2 4.0 4.2  CL 102   < > 110   < >  --  113*  --  111 110 104 101  CO2 19*   < > 20*   < >  --  18*  --  19* _0 GLUCOSE 99   < > 109*   < >  --  121*  --  84 91 95 101*  BUN 18   < > 6   < >  --  7  --  _1 CREATININE 0.67   < > 0.65   < >  --  0.59  --  0.44 0.54 0.60 0.64  CALCIUM 7.8*   < > 8.0*   < >  --  7.7*  --  8.0* 7.9* 8.2* 8.1*  MG 1.6*   < > 1.9   < >  --   --  1.7 1.7 1.8 1.7 1.7  PHOS 2.3*  --  1.5*  --  2.7  --   --   --   --   --   --    < > = values in this interval not displayed.    GFR: Estimated Creatinine Clearance: 92.1 mL/min (by C-G formula based on SCr of 0.64 mg/dL).  Liver Function Tests: Recent Labs  Lab 04/27/20 0111 04/29/20 0326 04/30/20 0800 05/01/20 0426  AST 167* 85* 51* 63*  ALT 102* 102* 80* 79*  ALKPHOS 139* 100 108 128*  BILITOT 1.4* 0.6 0.8 0.5  PROT 7.6 6.5 6.8 6.7  ALBUMIN 1.8* 1.8* 1.8* 1.8*    CBG: Recent Labs  Lab 04/27/20 0414 04/27/20 0803 04/27/20 2340 04/28/20 0337 04/28/20 0813  GLUCAP 135* 126* 123* 130* 98     Recent Results (from the past 240 hour(s))  MRSA PCR Screening     Status: Abnormal   Collection Time: 04/27/20  1:15 AM   Specimen: Nasopharyngeal  Result Value Ref Range Status   MRSA by PCR POSITIVE (A) NEGATIVE Final    Comment: CRITICAL RESULT CALLED TO, READ BACK BY AND VERIFIED WITH: RN [facility] MUELLER [id number]  THANEY   Culture, blood (routine x 2)     Status: Abnormal   Collection Time: 04/27/20  1:23 AM   Specimen: BLOOD  Result Value Ref Range Status   Specimen Description BLOOD LEFT ARM  Final   Special Requests    Final    BOTTLES DRAWN AEROBIC AND ANAEROBIC Blood Culture adequate volume   Culture  Setup Time   Final    IN BOTH AEROBIC AND ANAEROBIC BOTTLES GRAM POSITIVE COCCI IN CLUSTERS CRITICAL RESULT CALLED TO, READ BACK BY AND VERIFIED WITH: K AMEND [facility] 04/29/20 0117 JDW Performed at [facility], [place] [address], [place], [place] [address]    Culture METHICILLIN RESISTANT STAPHYLOCOCCUS AUREUS (A)  Final   Report Status 05/03/2020 FINAL  Final   Organism ID, Bacteria METHICILLIN RESISTANT STAPHYLOCOCCUS AUREUS  Final      Susceptibility   Methicillin resistant staphylococcus aureus - MIC*    CIPROFLOXACIN >=8 RESISTANT Resistant     ERYTHROMYCIN >=8 RESISTANT Resistant     GENTAMICIN <=0.5 SENSITIVE Sensitive     OXACILLIN >=4 RESISTANT Resistant     TETRACYCLINE <=1 SENSITIVE Sensitive     VANCOMYCIN <=0.5 SENSITIVE Sensitive     TRIMETH/SULFA <=10 SENSITIVE Sensitive     CLINDAMYCIN <=0.25 SENSITIVE Sensitive     RIFAMPIN <=0.5 SENSITIVE Sensitive     Inducible Clindamycin NEGATIVE Sensitive     * METHICILLIN RESISTANT STAPHYLOCOCCUS AUREUS  Blood Culture ID Panel (Reflexed)     Status: Abnormal   Collection Time: 04/27/20  1:23 AM  Result Value Ref Range Status   Enterococcus species NOT DETECTED NOT DETECTED Final   Listeria monocytogenes NOT DETECTED NOT DETECTED Final   Staphylococcus species DETECTED (A) NOT DETECTED Final    Comment: CRITICAL RESULT CALLED TO, READ BACK BY AND VERIFIED WITH: K AMEND [facility] 04/29/20 0117 JDW    Staphylococcus aureus (BCID) DETECTED (A) NOT DETECTED Final    Comment: Methicillin (oxacillin)-resistant Staphylococcus aureus (MRSA). MRSA is predictably resistant to beta-lactam antibiotics (except ceftaroline). Preferred therapy is vancomycin unless clinically contraindicated. Patient requires contact precautions if  hospitalized. CRITICAL RESULT CALLED TO, READ BACK BY AND VERIFIED WITH: K AMEND [facility] 04/29/20 0117 JDW    Methicillin  resistance DETECTED (A) NOT DETECTED Final    Comment: CRITICAL RESULT CALLED TO, READ BACK BY AND VERIFIED WITH: K AMEND PHARMD 04/29/20 0117    Streptococcus species NOT DETECTED NOT DETECTED Final   Streptococcus agalactiae NOT DETECTED NOT DETECTED Final   Streptococcus pneumoniae NOT DETECTED NOT DETECTED Final   Streptococcus pyogenes NOT DETECTED NOT DETECTED Final   Acinetobacter baumannii NOT DETECTED NOT DETECTED Final   Enterobacteriaceae species NOT DETECTED NOT DETECTED Final   Enterobacter cloacae complex NOT DETECTED NOT DETECTED Final   Escherichia coli NOT DETECTED NOT DETECTED Final   Klebsiella oxytoca NOT DETECTED NOT DETECTED Final   Klebsiella pneumoniae NOT DETECTED NOT DETECTED Final   Proteus species NOT DETECTED NOT DETECTED Final   Serratia marcescens NOT DETECTED NOT DETECTED Final   Haemophilus influenzae NOT DETECTED NOT DETECTED Final   Neisseria meningitidis NOT DETECTED NOT DETECTED Final   Pseudomonas aeruginosa NOT DETECTED NOT DETECTED Final   Candida albicans NOT DETECTED NOT DETECTED Final   Candida glabrata NOT DETECTED NOT  DETECTED Final   Candida krusei NOT DETECTED NOT DETECTED Final   Candida parapsilosis NOT DETECTED NOT DETECTED Final   Candida tropicalis NOT DETECTED NOT DETECTED Final    Comment: Performed at [facility], [place] [address], [place], [place] [address]  Culture, blood (routine x 2)     Status: Abnormal   Collection Time: 04/27/20  1:24 AM   Specimen: BLOOD  Result Value Ref Range Status   Specimen Description BLOOD LEFT FOREARM  Final   Special Requests   Final    BOTTLES DRAWN AEROBIC AND ANAEROBIC Blood Culture adequate volume   Culture  Setup Time   Final    GRAM POSITIVE COCCI IN BOTH AEROBIC AND ANAEROBIC BOTTLES CRITICAL VALUE NOTED.  VALUE IS CONSISTENT WITH PREVIOUSLY REPORTED AND CALLED VALUE.    Culture (A)  Final    STAPHYLOCOCCUS AUREUS SUSCEPTIBILITIES PERFORMED ON PREVIOUS CULTURE WITHIN THE LAST 5  DAYS. Performed at [facility], [place] [address], [place], [place] [address]    Report Status 04/30/2020 FINAL  Final  Culture, Urine     Status: None   Collection Time: 04/27/20  3:36 PM   Specimen: Urine, Random  Result Value Ref Range Status   Specimen Description URINE, RANDOM  Final   Special Requests NONE  Final   Culture   Final    NO GROWTH Performed at [facility], [place] [address] [place], [place] [address]    Report Status 04/28/2020 FINAL  Final  Culture, blood (routine x 2)     Status: Abnormal   Collection Time: 04/29/20  9:55 AM   Specimen: BLOOD  Result Value Ref Range Status   Specimen Description BLOOD RIGHT ANTECUBITAL  Final   Special Requests   Final    BOTTLES DRAWN AEROBIC ONLY Blood Culture results may not be optimal due to an inadequate volume of blood received in culture bottles   Culture  Setup Time   Final    AEROBIC BOTTLE ONLY GRAM POSITIVE COCCI IN CLUSTERS CRITICAL VALUE NOTED.  VALUE IS CONSISTENT WITH PREVIOUSLY REPORTED AND CALLED VALUE.    Culture (A)  Final    STAPHYLOCOCCUS AUREUS SUSCEPTIBILITIES PERFORMED ON PREVIOUS CULTURE WITHIN THE LAST 5 DAYS. Performed at [facility], [place] [address] [place], [place] [address]    Report Status 05/01/2020 FINAL  Final  Culture, blood (routine x 2)     Status: Abnormal   Collection Time: 04/29/20  9:59 AM   Specimen: BLOOD RIGHT HAND  Result Value Ref Range Status   Specimen Description BLOOD RIGHT HAND  Final   Special Requests   Final    BOTTLES DRAWN AEROBIC AND ANAEROBIC Blood Culture results may not be optimal due to an inadequate volume of blood received in culture bottles   Culture  Setup Time   Final    IN BOTH AEROBIC AND ANAEROBIC BOTTLES GRAM POSITIVE COCCI IN CLUSTERS CRITICAL VALUE NOTED.  VALUE IS CONSISTENT WITH PREVIOUSLY REPORTED AND CALLED VALUE.    Culture (A)  Final    STAPHYLOCOCCUS AUREUS SUSCEPTIBILITIES PERFORMED ON PREVIOUS CULTURE WITHIN THE LAST 5  DAYS. Performed at [facility], [place] [address] [place],  [address]    Report Status 05/01/2020 FINAL  Final         Radiology Studies: No results found.      Scheduled Meds: . buprenorphine-naloxone  1 tablet Sublingual BID  . carvedilol  3.125 mg Oral BID WC  .  Chlorhexidine Gluconate Cloth  6 each Topical Q0600  . dextromethorphan-guaiFENesin  1 tablet Oral BID  . lidocaine  1 patch Transdermal Q24H  . magnesium oxide  400 mg Oral BID  . mupirocin ointment  1 application Nasal BID  . QUEtiapine  50 mg Oral BID   Continuous Infusions: . sodium chloride Stopped (05/02/20 2205)  . vancomycin Stopped (05/03/20 0709)     LOS: 6 days    Time spent: 40 minutes    Irine Seal, MD [facility]   To contact the attending provider between 7A-7P or the covering provider during after hours 7P-7A, please log into the web site [email or web address] and access using universal [place] password for that web site. If you do not have the password, please call the hospital operator.  05/03/2020, 11:33 AM

## 2020-05-03 NOTE — Progress Notes (Signed)
Pt.c/o chest pain 8/10;v/s taken B/P]133/83;EKG done .NTG 0.4mg .;MD on call made aware.

## 2020-05-04 DIAGNOSIS — J939 Pneumothorax, unspecified: Secondary | ICD-10-CM

## 2020-05-04 LAB — BASIC METABOLIC PANEL
Anion gap: 9 (ref 5–15)
BUN: 12 mg/dL (ref 6–20)
CO2: 23 mmol/L (ref 22–32)
Calcium: 8.3 mg/dL — ABNORMAL LOW (ref 8.9–10.3)
Chloride: 103 mmol/L (ref 98–111)
Creatinine, Ser: 0.6 mg/dL (ref 0.44–1.00)
GFR calc Af Amer: >60 mL/min (ref >60)
GFR calc non Af Amer: >60 mL/min (ref >60)
Glucose, Bld: 95 mg/dL (ref 70–99)
Potassium: 4.4 mmol/L (ref 3.5–5.1)
Sodium: 135 mmol/L (ref 135–145)

## 2020-05-04 LAB — CBC WITH DIFFERENTIAL/PLATELET
Abs Immature Granulocytes: 0.08 10*3/uL — ABNORMAL HIGH (ref 0.00–0.07)
Basophils Absolute: 0.1 10*3/uL (ref 0.0–0.1)
Basophils Relative: 1 %
Eosinophils Absolute: 0.1 10*3/uL (ref 0.0–0.5)
Eosinophils Relative: 1 %
HCT: 28.6 % — ABNORMAL LOW (ref 36.0–46.0)
Hemoglobin: 8.4 g/dL — ABNORMAL LOW (ref 12.0–15.0)
Immature Granulocytes: 1 %
Lymphocytes Relative: 39 %
Lymphs Abs: 4.9 10*3/uL — ABNORMAL HIGH (ref 0.7–4.0)
MCH: 27 pg (ref 26.0–34.0)
MCHC: 29.4 g/dL — ABNORMAL LOW (ref 30.0–36.0)
MCV: 92 fL (ref 80.0–100.0)
Monocytes Absolute: 1.1 10*3/uL — ABNORMAL HIGH (ref 0.1–1.0)
Monocytes Relative: 9 %
Neutro Abs: 6.4 10*3/uL (ref 1.7–7.7)
Neutrophils Relative %: 49 %
Platelets: 238 10*3/uL (ref 150–400)
RBC: 3.11 MIL/uL — ABNORMAL LOW (ref 3.87–5.11)
RDW: 18.7 % — ABNORMAL HIGH (ref 11.5–15.5)
WBC: 12.6 10*3/uL — ABNORMAL HIGH (ref 4.0–10.5)
nRBC: 0 % (ref 0.0–0.2)

## 2020-05-04 LAB — MAGNESIUM: Magnesium: 1.7 mg/dL (ref 1.7–2.4)

## 2020-05-04 MED ORDER — ACETAMINOPHEN 325 MG PO TABS
650.0000 mg | ORAL_TABLET | Freq: Four times a day (QID) | ORAL | Status: DC | PRN
  Administered 2020-05-04 – 2020-05-13 (×3): 650 mg via ORAL
  Filled 2020-05-04 (×3): qty 2

## 2020-05-04 NOTE — Progress Notes (Signed)
Occupational Therapy Treatment Patient Details Name: Candice Jordan MRN: [id number] DOB: 03/13/2001 Today's Date: 05/04/2020    History of present illness Patient is a 19 year old female presenting to [facility] [place] [facility] ED on 5/25 with reported 1 week of shortness of breath. On arrival to ED CXR revealing of large right sided tension Pneumothorax. Chest Tube placed. 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 No longer requiring chest tube. History of MRSA Bacteremia/Endocarditis. COVID-19. Polysubstance Abuse.    OT comments  Pt seen for OT follow up with focus on independent living skills and cognition. Pt remains fixated on leaving AMA. She discussed her IVC ending the 4th and how she plans to leave then. Challenged pt to problem solve the cause/effect of what could happen if she were to leave. Pt had difficulty doing this, and once explained by OT she stated "I understand I am just getting depressed". She requested to have orders to go outside which was not permitted by Dr. Gerri Lins due to IVC status. Discussed coping skills with pt such as coloring, puzzles, and music (which she has available to her in room) and encouraged her to continue to participate with OT to work on life skills. 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 OT will continue to follow to work on life skills planning and cognitive skills with pt.    Follow Up Recommendations  No OT follow up;Supervision - Intermittent(inpatient drug/psych rehab)    Equipment Recommendations  None recommended by OT    Recommendations for Other Services      Precautions / Restrictions Precautions Precautions: Fall Precaution Comments: sitter present due to IVC Restrictions Weight Bearing Restrictions: No       Mobility Bed Mobility Overal bed mobility: Needs Assistance Bed Mobility: Supine to Sit      Supine to sit: Supervision        Transfers                      Balance Overall balance assessment: Mild deficits observed, not formally tested                                         ADL either performed or assessed with clinical judgement   ADL Overall ADL's : Needs assistance/impaired     Grooming: Supervision/safety;Standing Grooming Details (indicate cue type and reason): increased time and cues for sequencing and processing                               General ADL Comments: session focused mostly on life skills and insight building for safety     Vision Patient Visual Report: No change from baseline     Perception     Praxis      Cognition Arousal/Alertness: Awake/alert Behavior During Therapy: Restless Overall Cognitive Status: No family/caregiver present to determine baseline cognitive functioning Area of Impairment: Safety/judgement;Awareness;Attention;Problem solving                   Current Attention Level: Alternating     Safety/Judgement: Decreased awareness of safety;Decreased awareness of deficits Awareness: Intellectual Problem Solving: Slow processing;Difficulty sequencing General Comments: continues to show poor insight and awareness. Session focused on beginning to build insight and educating pt on  condition and long term effects if she were to leave the hospital        Exercises     Shoulder Instructions       General Comments      Pertinent Vitals/ Pain       Pain Assessment: No/denies pain  Home Living                                          Prior Functioning/Environment              Frequency  Min 2X/week        Progress Toward Goals  OT Goals(current goals can now be found in the care plan section)  Progress towards OT goals: Progressing toward goals  Acute Rehab OT Goals Patient Stated Goal: "leave once my IVC ends" OT Goal Formulation: With  patient Time For Goal Achievement: 05/16/20 Potential to Achieve Goals: Good  Plan Discharge plan remains appropriate    Co-evaluation                 AM-PAC OT "6 Clicks" Daily Activity     Outcome Measure   Help from another person eating meals?: None Help from another person taking care of personal grooming?: A Little Help from another person toileting, which includes using toliet, bedpan, or urinal?: A Little Help from another person bathing (including washing, rinsing, drying)?: A Little Help from another person to put on and taking off regular upper body clothing?: A Little Help from another person to put on and taking off regular lower body clothing?: A Little 6 Click Score: 19    End of Session Equipment Utilized During Treatment: Gait belt  OT Visit Diagnosis: Unsteadiness on feet (R26.81);Other symptoms and signs involving cognitive function   Activity Tolerance Patient tolerated treatment well   Patient Left in bed;with call bell/phone within reach;with nursing/sitter in room   Nurse Communication Mobility status        Time: 6010-9323 OT Time Calculation (min): 17 min  Charges: OT General Charges $OT Visit: 1 Visit OT Treatments $Cognitive Funtion inital: Initial 15 mins  Zenovia Jarred, MSOT, OTR/L Acute Rehabilitation Services [facility] Office Number: [phone] Pager: [phone]  Zenovia Jarred 05/04/2020, 6:28 PM

## 2020-05-04 NOTE — Progress Notes (Signed)
Noted pt diaphoretic.  Temp 100.7 orally.  Dr. Gerri Lins made aware.  Hinton Dyer, RN

## 2020-05-04 NOTE — Progress Notes (Signed)
PROGRESS NOTE  Candice Jordan FTD:[id number] DOB: [birth year] DOA: 04/27/2020 PCP: System, Provider Not In  Brief History   19 year old female presents to [facility] ED on 5/25 with reported 1 week of shortness of breath. Patient was found at [address] with oxygen saturation 79%. Reports using Heroin last night. Diagnosed with MRSA Bacteremia/Endocarditis and COVID 03/08/2020 however has left AMA numerous times throughout the month of April. On arrival to ED. CXR revealing of large right sided tension Pneumothorax. Chest Tube placed. 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 She has been IVC'd.   Consultants  . PCCM . Infectious Disease . Psychiatry . Cardiothoracic surgery  Procedures  . None  Antibiotics   Anti-infectives (From admission, onward)   Start     Dose/Rate Route Frequency Ordered Stop   05/01/20 1430  vancomycin (VANCOCIN) IVPB 1000 mg/200 mL premix     1,000 mg 200 mL/hr over 60 Minutes Intravenous Every 8 hours 05/01/20 0946     04/30/20 0600  vancomycin (VANCOCIN) IVPB 1000 mg/200 mL premix  Status:  Discontinued     1,000 mg 200 mL/hr over 60 Minutes Intravenous Every 8 hours 04/29/20 2318 05/01/20 0947   04/29/20 2330  vancomycin (VANCOREADY) IVPB 500 mg/100 mL  Status:  Discontinued     500 mg 100 mL/hr over 60 Minutes Intravenous Every 8 hours 04/29/20 1648 04/29/20 2315   04/27/20 1100  vancomycin (VANCOREADY) IVPB 500 mg/100 mL  Status:  Discontinued     500 mg 100 mL/hr over 60 Minutes Intravenous Every 8 hours 04/27/20 0210 04/29/20 1648   04/27/20 0300  vancomycin (VANCOCIN) IVPB 1000 mg/200 mL premix     1,000 mg 200 mL/hr over 60 Minutes Intravenous  Once 04/27/20 0203 04/27/20 0423   04/27/20 0300  piperacillin-tazobactam (ZOSYN) IVPB 3.375 g  Status:   Discontinued     3.375 g 12.5 mL/hr over 240 Minutes Intravenous Every 8 hours 04/27/20 0210 04/27/20 0856    .  Subjective  The patient is sleeping soundly. She is not awakened.  Objective   Vitals:  Vitals:   05/04/20 1621 05/04/20 1624  BP: 115/71 115/71  Pulse: (!) 155 (!) 140  Resp:  20  Temp:  98.4 F (36.9 C)  SpO2: 96% 96%   Exam:  Constitutional:  . The patient is sleeping soundly. She is not awakened. No acute distress. Respiratory:  . No increased work of breathing. . No wheezes, rales, or rhonchi . No tactile fremitus Cardiovascular:  . Regular rate and rhythm . No murmurs, ectopy, or gallups. . No lateral PMI. No thrills. Abdomen:  . Abdomen is soft, non-tender, non-distended . No hernias, masses, or organomegaly . Normoactive bowel sounds.  Musculoskeletal:  . No cyanosis, clubbing, or edema Skin:  . No rashes, lesions, ulcers . palpation of skin: no induration or nodules Neurologic:  . CN 2-12 intact . Sensation all 4 extremities intact Psychiatric:  . Mental status o Mood, affect appropriate o Orientation to person, place, time  . judgment and insight appear intact   I have personally reviewed the following:   Today's Data  . Vitals, CBC, BMP  Micro Data  . Blood Cultures x 2 (04/29/2020): Positive for Staph Aureus . Blood Cultures x 2 (05/03/2020): No growth  Imaging  . CXR Diffuse right and  patchy left consolidate consistent with pneumonia (04/28/2020)  Scheduled Meds: . buprenorphine-naloxone  1 tablet Sublingual BID  . carvedilol  3.125 mg Oral BID WC  . Chlorhexidine Gluconate Cloth  6 each Topical Q0600  . dextromethorphan-guaiFENesin  1 tablet Oral BID  . lidocaine  1 patch Transdermal Q24H  . magnesium oxide  400 mg Oral BID  . mupirocin ointment  1 application Nasal BID  . QUEtiapine  50 mg Oral BID   Continuous Infusions: . sodium chloride Stopped (05/02/20 2205)  . 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  Patient met criteria for sepsis on admission with tachypnea, tachycardia, leukocytosis, concern for untreated bacteremia.  Repeat 2D echo which was done showed worsening/severe tricuspid valvular regurgitation, tricuspid valve vegetation attached to posterior leaflet measuring 0.9 cm x 0.5 cm more apparent on this study the last study.  Severely dilated right atrial size.  Hyperdynamic right ventricular systolic function.  Continue empiric IV vancomycin.  CT surgery was consulted 04/28/2020 for possible valve repair however due to ongoing IV drug abuse patient at high risk for reinfection if she were to undergo valve replacement and recommending focusing on drug rehabilitation.  Per CT surgery obtaining a TEE will not change surgical candidacy and recommending medical management for now.  Repeat blood cultures from 04/29/2028 + for staph aureus.  Repeat blood cultures drawn on 05/03/2020. Will monitor.  Continue IV vancomycin.  ID following.  Acute hypoxemic respiratory distress secondary to right-sided tension pneumothorax status post chest tube placement: Chest tube now removed. 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  Chest tube removed 04/28/2020 per PCCM.  Repeat chest x-ray done showed no recurrent pneumothorax.  Pulmonary hygiene.  Mucinex added due to ongoing  cough.  Place on Hycodan as needed.  Supportive care.  Polysubstance abuse/heroin use: UDS noted at outside hospital to be positive for amphetamines. Patient noted to have multiple admissions for endocarditis, where she has left AMA . Patient seen by psychiatry on 04/27/2020 and is felt patient lacks capacity to make an informed medical decision regarding her medical care and disposition and recommending social work evaluation to contact next of kin no appropriate hospital administrator to help with decision-making process. Continue Suboxone.  Was on clonidine taper/detox protocol. Patient wanted to leave AMA again (04/29/2020). Patient however did state she understands the risk of leaving AMA which include death. Was also felt by PCCM that patient lacked capacity poor insight into her current medical situation. Patient with poor insight into her current medical situation.  Patient not thinking rationally. Patient has subsequently been IVC'd to finish out treatment for MRSA disseminated infection/endocarditis with worsening tricuspid regurgitation. Patient deemed not a surgical candidate due to ongoing IV drug use. Patient has been reassessed by psych who are in agreement that patient lacks capacity.    Anemia  of chronic disease: Anemia panel consistent with anemia of chronic disease.  Status post transfusion 1 unit packed red blood cells.  Hemoglobin currently at 8.2 from 7.4 from 7.2 from 7.9 from 7.0.  DIC panel negative for schistocytes. Transfusion threshold hemoglobin <7.  Tachycardia: Likely secondary to problems # 1,2,3,4.  Continue treatment with IV antibiotics.  Patient s/p transfusion 1 unit of packed red blood cells.  Lopressor as needed for heart rate > 130.  Will place on Coreg 3.125 mg p.o. twice daily.  Follow.  Hypomagnesemia/hypokalemia: Magnesium at 1.7 this morning. We will give magnesium sulfate 4 g IV x1. Continue oral supplementation. Potassium of 4.2. Follow.  Positive hep C  antibody/transaminitis: HCV RNA quant of 2260.  Outpatient follow-up with ID.   I have seen and examined this patient myself. I have spent 34 minutes in her evaluation and care.  DVT prophylaxis: SCDs Code Status: Full Family Communication: No family at bedside. Disposition:   Status is: Inpatient    Dispo: The patient is from: Home  Anticipated d/c is to: To be determined  Anticipated d/c date is: Once IV antibiotics have been completed.  Needs antibiotics for 6 weeks.  When cleared by ID.  Patient currently on IV antibiotics, with tricuspid endocarditis, being followed by ID.  Patient has now been IVC'D.  Patient currently not safe to be discharged home as patient has left multiple times AMA and now has worsening tricuspid regurg with tricuspid endocarditis with interrupted treatments.  Patient at high risk for decompensation.Karie Kirks, DO [facility] Direct contact: see [email or web address]  7PM-7AM contact night coverage as above 05/04/2020, 5:06 PM  LOS: 7 days

## 2020-05-04 NOTE — Progress Notes (Signed)
Pharmacy Antibiotic Note  Candice Jordan is a 19 y.o. female admitted on 04/27/2020 with MRSA bacteremia/TV endocarditis and septic emboli.   Scr stable at 0.6. Afebrile. WBC 12.6. Continues on vancomycin 1g IV every 8 hours - vanc trough 16 on last check. Most recent blood cultures showing no growth to date.   Plan: Continue vancomycin to 1000mg  IV every 8 hours.  Goal trough 15-20 mcg/mL. Continue to obtain vancomycin levels weekly to ensure pt is therapeutic. Holding off PICC line placement until repeat blood culture negative   Height: 5\' 5"  (165.1 cm) Weight: 52.2 kg (115 lb 1.3 oz) IBW/kg (Calculated) : 57  Temp (24hrs), Avg:99.1 F (37.3 C), Min:97.6 F (36.4 C), Max:100.7 F (38.2 C)  Recent Labs  Lab 04/29/20 0326 04/29/20 2212 04/30/20 0800 05/01/20 0426 05/01/20 1325 05/02/20 0344 05/03/20 0237 05/04/20 0609  WBC   < >  --  16.3* 13.0*  --  11.0* 10.2 12.6*  CREATININE   < >  --  0.44 0.54  --  0.60 0.64 0.60  VANCOTROUGH  --  8*  --   --  16  --   --   --    < > = values in this interval not displayed.    Estimated Creatinine Clearance: 92.4 mL/min (by C-G formula based on SCr of 0.6 mg/dL).    No Known Allergies  Antimicrobials this admission: Vancomycin 5/26>> Zosyn 5/25>>5/26  Dose adjustments this admission: -5/28: VT 8 - change from 500 mg IV every 8 hours to 1000 mg IV every 8 hours -5/30: VT 16 - continue same regimen  Microbiology results: 5/25 Bcx (at OSH): GPC in clusters (2/2) -MRSA  5/26BCx: staph aureus 2/2; BCID w/ MRSA 1/4 5/26UCx: neg 5/26MRSA PCR: pos  5/28 Bcx: staph aurus 2/2 6/1 Bcx: NGTD  Thank you for allowing pharmacy to be a part of this patient's care.  6/28, PharmD, BCCCP Clinical Pharmacist  Phone: [phone] 05/04/2020 12:59 PM  Please check AMION for all [facility] Pharmacy phone numbers After 10:00 PM, call Main Pharmacy [phone]

## 2020-05-05 NOTE — Progress Notes (Signed)
IV infiltrated after vancomycin infusion completed. Patient reports site is painful. Site is warm to touch and no discoloration noted.     Pharmacy notified. Per pharmacy, apply cold compress. IV taken out and cold compress applied.

## 2020-05-05 NOTE — Progress Notes (Signed)
Physical Therapy Treatment Patient Details Name: Candice Jordan MRN: [id number] DOB: 06/21/2001 Today's Date: 05/05/2020    History of Present Illness Patient is a 18 year old female presenting to [facility] ED on 5/25 with reported 1 week of shortness of breath. On arrival to ED CXR revealing of large right sided tension Pneumothorax. Chest Tube placed. 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 No longer requiring chest tube. History of MRSA Bacteremia/Endocarditis. COVID-19. Polysubstance Abuse.     PT Comments    Pt dozing on entry, very flat affect and reluctant to work with therapy today. Pt reports not being able to sleep last night. Pt agreeable to get up to go to the bathroom. HR increased to 130s with mobilization. Tried to engage pt in conversation about goals over the next weeks. Pt reports she knows that she needs to stay in hospital and why but is not happy about it. Encouraged pt to think about some long term goals we could work on. PT will continue to follow acutely.   Follow Up Recommendations  No PT follow up     Equipment Recommendations  None recommended by PT    Recommendations for Other Services (would benefit from social work re: drug rehab)     Precautions / Restrictions Precautions Precautions: Fall Precaution Comments: sitter present due to IVC Restrictions Weight Bearing Restrictions: No    Mobility  Bed Mobility Overal bed mobility: Needs Assistance Bed Mobility: Supine to Sit     Supine to sit: Modified independent (Device/Increase time)     General bed mobility comments: comes to longsitting in bed and swings LE around to EoB  Transfers Overall transfer level: Needs assistance Equipment used: None Transfers: Sit to/from Stand Sit to Stand: Supervision         General transfer comment: for safety able to self steady with LE bracing on bed behind  her  Ambulation/Gait Ambulation/Gait assistance: Supervision Gait Distance (Feet): 20 Feet Assistive device: None Gait Pattern/deviations: Step-through pattern;Decreased stride length;Drifts right/left Gait velocity: decreased Gait velocity interpretation: 1.31 - 2.62 ft/sec, indicative of limited community ambulator General Gait Details: mild unsteadiness, with occassional scissoring but able to self steady         Balance Overall balance assessment: Mild deficits observed, not formally tested                                          Cognition Arousal/Alertness: Awake/alert Behavior During Therapy: Flat affect Overall Cognitive Status: Impaired/Different from baseline Area of Impairment: Safety/judgement                         Safety/Judgement: Decreased awareness of safety;Decreased awareness of deficits     General Comments: reports today that she knows she needs to stay but she does not want to         General Comments General comments (skin integrity, edema, etc.): HR in 130s with ambulation to bathroom and back      Pertinent Vitals/Pain Pain Assessment: No/denies pain           PT Goals (current goals can now be found in the care plan section) Acute Rehab PT Goals Patient Stated Goal: get some sleep  PT Goal Formulation: With patient Time For Goal Achievement: 05/15/20 Potential to Achieve Goals: Good Progress towards PT goals: Not progressing toward  goals - comment(limited by fatigue and increased HR )    Frequency    Min 1X/week      PT Plan Current plan remains appropriate    Co-evaluation PT/OT/SLP Co-Evaluation/Treatment: Yes            AM-PAC PT "6 Clicks" Mobility   Outcome Measure  Help needed turning from your back to your side while in a flat bed without using bedrails?: None Help needed moving from lying on your back to sitting on the side of a flat bed without using bedrails?: None Help needed  moving to and from a bed to a chair (including a wheelchair)?: None Help needed standing up from a chair using your arms (e.g., wheelchair or bedside chair)?: A Little Help needed to walk in hospital room?: A Little Help needed climbing 3-5 steps with a railing? : A Little 6 Click Score: 21    End of Session   Activity Tolerance: Patient tolerated treatment well Patient left: in bed;with call bell/phone within reach;with nursing/sitter in room Nurse Communication: Mobility status PT Visit Diagnosis: Unsteadiness on feet (R26.81);Other abnormalities of gait and mobility (R26.89);Muscle weakness (generalized) (M62.81)     Time: 5465-0354 PT Time Calculation (min) (ACUTE ONLY): 18 min  Charges:  $Gait Training: 8-22 mins                     [name] B. Migdalia Dk PT, DPT Acute Rehabilitation Services Pager [phone] Office [phone]    [place] 05/05/2020, 11:55 AM

## 2020-05-05 NOTE — Progress Notes (Signed)
PROGRESS NOTE  Twylia Oka YCX:[id number] DOB: July 27, 2001 DOA: 04/27/2020 PCP: System, Provider Not In  Brief History   19 year old female presents to [facility] ED on 5/25 with reported 1 week of shortness of breath. Patient was found at [address] with oxygen saturation 79%. Reports using Heroin last night. Diagnosed with MRSA Bacteremia/Endocarditis and COVID 03/08/2020 however has left AMA numerous times throughout the month of April. On arrival to ED. CXR revealing of large right sided tension Pneumothorax. Chest Tube placed. 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 She has been IVC'd. IVC was reneiwed today.  Consultants   PCCM  Infectious Disease  Psychiatry  Cardiothoracic surgery  Procedures   None  Antibiotics   Anti-infectives (From admission, onward)   Start     Dose/Rate Route Frequency Ordered Stop   05/01/20 1430  vancomycin (VANCOCIN) IVPB 1000 mg/200 mL premix     1,000 mg 200 mL/hr over 60 Minutes Intravenous Every 8 hours 05/01/20 0946     04/30/20 0600  vancomycin (VANCOCIN) IVPB 1000 mg/200 mL premix  Status:  Discontinued     1,000 mg 200 mL/hr over 60 Minutes Intravenous Every 8 hours 04/29/20 2318 05/01/20 0947   04/29/20 2330  vancomycin (VANCOREADY) IVPB 500 mg/100 mL  Status:  Discontinued     500 mg 100 mL/hr over 60 Minutes Intravenous Every 8 hours 04/29/20 1648 04/29/20 2315   04/27/20 1100  vancomycin (VANCOREADY) IVPB 500 mg/100 mL  Status:  Discontinued     500 mg 100 mL/hr over 60 Minutes Intravenous Every 8 hours 04/27/20 0210 04/29/20 1648   04/27/20 0300  vancomycin (VANCOCIN) IVPB 1000 mg/200 mL premix     1,000 mg 200 mL/hr over 60 Minutes Intravenous  Once 04/27/20 0203 04/27/20 0423   04/27/20 0300  piperacillin-tazobactam (ZOSYN) IVPB  3.375 g  Status:  Discontinued     3.375 g 12.5 mL/hr over 240 Minutes Intravenous Every 8 hours 04/27/20 0210 04/27/20 0856     Subjective  The patient is sleeping soundly. She is not awakened.  Objective   Vitals:  Vitals:   05/05/20 0929 05/05/20 1130  BP: 126/78 107/67  Pulse: (!) 133 (!) 125  Resp:  17  Temp:  98.3 F (36.8 C)  SpO2:  97%   Exam:  Constitutional:   The patient is sleeping soundly. She is not awakened. No acute distress. Respiratory:   No increased work of breathing.  No wheezes, rales, or rhonchi  No tactile fremitus Cardiovascular:   Regular rate and rhythm  No murmurs, ectopy, or gallups.  No lateral PMI. No thrills. Abdomen:   Abdomen is soft, non-tender, non-distended  No hernias, masses, or organomegaly  Normoactive bowel sounds.  Musculoskeletal:   No cyanosis, clubbing, or edema Skin:   No rashes, lesions, ulcers  palpation of skin: no induration or nodules Neurologic:   CN 2-12 intact  Sensation all 4 extremities intact Psychiatric:   Mental status o Mood, affect appropriate o Orientation to person, place, time   judgment and insight appear intact   I have personally reviewed the following:   Today's Data   Vitals  Micro Data   Blood Cultures x 2 (04/29/2020): Positive for Staph Aureus  Blood Cultures x 2 (05/03/2020): No growth  Imaging   CXR Diffuse right and  patchy left consolidate consistent with pneumonia (04/28/2020)  Scheduled Meds:  buprenorphine-naloxone  1 tablet Sublingual BID   carvedilol  3.125 mg Oral BID WC   Chlorhexidine Gluconate Cloth  6 each Topical Q0600   dextromethorphan-guaiFENesin  1 tablet Oral BID   lidocaine  1 patch Transdermal Q24H   magnesium oxide  400 mg Oral BID   mupirocin ointment  1 application Nasal BID   QUEtiapine  50 mg Oral BID   Continuous Infusions:  sodium chloride Stopped (05/02/20 2205)   vancomycin 1,000 mg (05/05/20 0706)    Active  Problems:   Endocarditis   Spontaneous tension pneumothorax   AKI (acute kidney injury) ([place])   Sepsis without acute organ dysfunction (HCC)   Pneumothorax   Status post chest tube placement (HCC)   Polysubstance abuse (HCC)   Anemia   Hypokalemia   Hypomagnesemia   Tachycardia   Positive hepatitis C antibody test   LOS: 8 days    A & P  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  Patient met criteria for sepsis on admission with tachypnea, tachycardia, leukocytosis, concern for untreated bacteremia.  Repeat 2D echo which was done showed worsening/severe tricuspid valvular regurgitation, tricuspid valve vegetation attached to posterior leaflet measuring 0.9 cm x 0.5 cm more apparent on this study the last study.  Severely dilated right atrial size.  Hyperdynamic right ventricular systolic function.  Continue empiric IV vancomycin.  CT surgery was consulted 04/28/2020 for possible valve repair however due to ongoing IV drug abuse patient at high risk for reinfection if she were to undergo valve replacement and recommending focusing on drug rehabilitation.  Per CT surgery obtaining a TEE will not change surgical candidacy and recommending medical management for now.  Repeat blood cultures from 04/29/2028 + for staph aureus.  Repeat blood cultures drawn on 05/03/2020. Will monitor.  Continue IV vancomycin.  ID following.  Acute hypoxemic respiratory distress secondary to right-sided tension pneumothorax status post chest tube placement: Chest tube now removed. 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  Chest tube removed 04/28/2020 per PCCM.  Repeat chest x-ray done showed no recurrent pneumothorax.  Pulmonary hygiene.  Mucinex added due to ongoing  cough.  Place on Hycodan as needed.  Supportive care.  Polysubstance abuse/heroin use: UDS noted at outside hospital to be positive for amphetamines. Patient noted to have multiple admissions for endocarditis, where she has left AMA . Patient seen by psychiatry on 04/27/2020 and is felt patient lacks capacity to make an informed medical decision regarding her medical care and disposition and recommending social work evaluation to contact next of kin no appropriate hospital administrator to help with decision-making process. Continue Suboxone.  Was on clonidine taper/detox protocol. Patient wanted to leave AMA again (04/29/2020). Patient however did state she understands the risk of leaving AMA which include death. Was also felt by PCCM that patient lacked capacity poor insight into her current medical situation. Patient with poor insight into her current medical situation.  Patient not thinking rationally. Patient has subsequently been IVC'd to finish out treatment for MRSA disseminated infection/endocarditis with worsening tricuspid regurgitation. Patient deemed not a surgical candidate due to ongoing IV drug use. Patient has been reassessed by psych who are in agreement that patient lacks capacity.    Anemia  of chronic disease: Anemia panel consistent with anemia of chronic disease.  Status post transfusion 1 unit packed red blood cells.  Hemoglobin currently at 8.2 from 7.4 from 7.2 from 7.9 from 7.0.  DIC panel negative for schistocytes. Transfusion threshold hemoglobin <7.  Tachycardia: Likely secondary to problems # 1,2,3,4.  Continue treatment with IV antibiotics.  Patient s/p transfusion 1 unit of packed red blood cells.  Lopressor as needed for heart rate > 130.  Will place on Coreg 3.125 mg p.o. twice daily.  Follow.  Hypomagnesemia/hypokalemia: Magnesium at 1.7 this morning. We will give magnesium sulfate 4 g IV x1. Continue oral supplementation. Potassium of 4.2. Follow.  Positive hep C  antibody/transaminitis: HCV RNA quant of 2260.  Outpatient follow-up with ID.   I have seen and examined this patient myself. I have spent 32 minutes in her evaluation and care.  DVT prophylaxis: SCDs Code Status: Full Family Communication: No family at bedside. Disposition:   Status is: Inpatient    Dispo: The patient is from: Home  Anticipated d/c is to: To be determined  Anticipated d/c date is: Once IV antibiotics have been completed.  Needs antibiotics for 6 weeks.  When cleared by ID.  Patient currently on IV antibiotics, with tricuspid endocarditis, being followed by ID.  Patient has now been IVC'D.  Patient currently not safe to be discharged home as patient has left multiple times AMA and now has worsening tricuspid regurg with tricuspid endocarditis with interrupted treatments.  Patient at high risk for decompensation.Karie Kirks, DO [facility] Direct contact: see [email or web address]  7PM-7AM contact night coverage as above 05/05/2020, 2:09 PM  LOS: 7 days

## 2020-05-05 NOTE — Progress Notes (Addendum)
10:40am-[employer] served patient. Patient is IVCd. IVC is good for seven days.  CSW will continue to follow.  10:20am- IVC has been sent from magistrate office.CSW has called [employer] to serve pt. Pt is waiting to be served by [employer].  10:00am Pt is in the process of being IVCed. IVC forms have been signed by doctor and faxed to the magistrate.   If pt tries to leave she should be detained.   CSW will update when complete.

## 2020-05-05 NOTE — Progress Notes (Addendum)
    [facility]    Date of Admission:  04/27/2020   Total days of antibiotics 10          ID: Candice Jordan is a 19 y.o. female with   Active Problems:   Endocarditis   Spontaneous tension pneumothorax   AKI (acute kidney injury) (HCC)   Sepsis without acute organ dysfunction (HCC)   Pneumothorax   Status post chest tube placement (HCC)   Polysubstance abuse (HCC)   Anemia   Hypokalemia   Hypomagnesemia   Tachycardia   Positive hepatitis C antibody test    Subjective: Remains afebrile, sleepy today, did have breakfast, and sitter reports moving around in the morning.  Medications:  . buprenorphine-naloxone  1 tablet Sublingual BID  . carvedilol  3.125 mg Oral BID WC  . Chlorhexidine Gluconate Cloth  6 each Topical Q0600  . dextromethorphan-guaiFENesin  1 tablet Oral BID  . lidocaine  1 patch Transdermal Q24H  . magnesium oxide  400 mg Oral BID  . mupirocin ointment  1 application Nasal BID  . QUEtiapine  50 mg Oral BID    Objective: Vital signs in last 24 hours: Temp:  [98 F (36.7 C)-99 F (37.2 C)] 99 F (37.2 C) (06/03 1551) Pulse Rate:  [122-137] 131 (06/03 1614) Resp:  [16-20] 16 (06/03 1551) BP: (101-126)/(57-82) 108/70 (06/03 1551) SpO2:  [97 %-100 %] 98 % (06/03 1551) Weight:  [49 kg] 49 kg (06/03 0424) Physical Exam  Constitutional:  oriented to person. appears well-developed and well-nourished. No distress.  HENT: [place]/AT, PERRLA, no scleral icterus Mouth/Throat: Oropharynx is clear and moist. No oropharyngeal exudate.  Cardiovascular: tachy, regular rhythm and normal heart sounds. Exam reveals no gallop and no friction rub.  No murmur heard.  Pulmonary/Chest: Effort normal and breath sounds normal. No respiratory distress.  has no wheezes.  Lymphadenopathy: no cervical adenopathy. No axillary adenopathy Neurological: alert and oriented to person, place, and time.  Skin: Skin is warm and dry. No rash noted. No erythema.   Lab  Results Recent Labs    05/03/20 0237 05/04/20 0609  WBC 10.2 12.6*  HGB 8.2* 8.4*  HCT 27.2* 28.6*  NA 136 135  K 4.2 4.4  CL 101 103  CO2 24 23  BUN 8 12  CREATININE 0.64 0.60    Microbiology: 6/1 blood cx NGTD at 48hrs Studies/Results: No results found.   Assessment/Plan: MRSA pneumonia, c/b empyema. 2/2 TV endocarditis = plan to continue with vancomycin for now. Ideally would like her to have observed therapy to ensure she completes at least 4 wk of iv therapy. Ideally would like to do 6 wk if she is able to observed for therapy. Pharmacy adjusting her vancomycin dose per therapeutic range  Sinus tachycardia = still tachy at rest. Continue on telemetry. Unclear if need to consider PE as cause of sinus tac. hgb stable at 8.2  Dr comer to see back on monday  [facility] Cell: [phone] Pager: [phone]  05/05/2020, 5:37 PM

## 2020-05-06 LAB — BASIC METABOLIC PANEL
Anion gap: 9 (ref 5–15)
BUN: 13 mg/dL (ref 6–20)
CO2: 24 mmol/L (ref 22–32)
Calcium: 8.2 mg/dL — ABNORMAL LOW (ref 8.9–10.3)
Chloride: 100 mmol/L (ref 98–111)
Creatinine, Ser: 0.62 mg/dL (ref 0.44–1.00)
GFR calc Af Amer: >60 mL/min (ref >60)
GFR calc non Af Amer: >60 mL/min (ref >60)
Glucose, Bld: 108 mg/dL — ABNORMAL HIGH (ref 70–99)
Potassium: 4 mmol/L (ref 3.5–5.1)
Sodium: 133 mmol/L — ABNORMAL LOW (ref 135–145)

## 2020-05-06 LAB — MAGNESIUM: Magnesium: 1.6 mg/dL — ABNORMAL LOW (ref 1.7–2.4)

## 2020-05-06 LAB — CULTURE, BLOOD (ROUTINE X 2)

## 2020-05-06 LAB — VANCOMYCIN, TROUGH: Vancomycin Tr: 29 ug/mL (ref 15–20)

## 2020-05-06 MED ORDER — MAGNESIUM SULFATE 4 GM/100ML IV SOLN
4.0000 g | Freq: Once | INTRAVENOUS | Status: AC
  Administered 2020-05-06: 4 g via INTRAVENOUS
  Filled 2020-05-06: qty 100

## 2020-05-06 NOTE — Progress Notes (Signed)
PROGRESS NOTE  Candice Jordan FHQ:[id number] DOB: October 16, 2001 DOA: 04/27/2020 PCP: System, Provider Not In  Brief History   19 year old female presents to [facility] ED on 5/25 with reported 1 week of shortness of breath. Patient was found at [address] with oxygen saturation 79%. Reports using Heroin last night. Diagnosed with MRSA Bacteremia/Endocarditis and COVID 03/08/2020 however has left AMA numerous times throughout the month of April. On arrival to ED. CXR revealing of large right sided tension Pneumothorax. Chest Tube placed. 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 She has been IVC'd. IVC was renewed on 05/05/2020.  The patient has had continual tachycardia since admission. Likely due to [place] secondary to chronic cocaine and heroin abuse, but will check CTA chest to rule out PE.   Consultants  . PCCM . Infectious Disease . Psychiatry . Cardiothoracic surgery  Procedures  . None  Antibiotics   Anti-infectives (From admission, onward)   Start     Dose/Rate Route Frequency Ordered Stop   05/01/20 1430  vancomycin (VANCOCIN) IVPB 1000 mg/200 mL premix     1,000 mg 200 mL/hr over 60 Minutes Intravenous Every 8 hours 05/01/20 0946     04/30/20 0600  vancomycin (VANCOCIN) IVPB 1000 mg/200 mL premix  Status:  Discontinued     1,000 mg 200 mL/hr over 60 Minutes Intravenous Every 8 hours 04/29/20 2318 05/01/20 0947   04/29/20 2330  vancomycin (VANCOREADY) IVPB 500 mg/100 mL  Status:  Discontinued     500 mg 100 mL/hr over 60 Minutes Intravenous Every 8 hours 04/29/20 1648 04/29/20 2315   04/27/20 1100  vancomycin (VANCOREADY) IVPB 500 mg/100 mL  Status:  Discontinued     500 mg 100 mL/hr over 60 Minutes Intravenous Every 8 hours 04/27/20 0210 04/29/20 1648   04/27/20 0300  vancomycin  (VANCOCIN) IVPB 1000 mg/200 mL premix     1,000 mg 200 mL/hr over 60 Minutes Intravenous  Once 04/27/20 0203 04/27/20 0423   04/27/20 0300  piperacillin-tazobactam (ZOSYN) IVPB 3.375 g  Status:  Discontinued     3.375 g 12.5 mL/hr over 240 Minutes Intravenous Every 8 hours 04/27/20 0210 04/27/20 0856     Subjective  The patient is sleeping soundly. She is not awakened.  Objective   Vitals:  Vitals:   05/06/20 0958 05/06/20 1436  BP: 115/69 (!) 90/53  Pulse: (!) 118 (!) 110  Resp:  (!) 21  Temp:  97.9 F (36.6 C)  SpO2:  96%   Exam:  Constitutional:  . The patient is sleeping soundly. She is not awakened. No acute distress. Respiratory:  . No increased work of breathing. . No wheezes, rales, or rhonchi . No tactile fremitus Cardiovascular:  . Regular rate and rhythm . No murmurs, ectopy, or gallups. . No lateral PMI. No thrills. Abdomen:  . Abdomen is soft, non-tender, non-distended . No hernias, masses, or organomegaly . Normoactive bowel sounds.  Musculoskeletal:  . No cyanosis, clubbing, or edema Skin:  . No rashes, lesions, ulcers . palpation of skin: no induration or nodules Neurologic:  . CN 2-12 intact . Sensation all 4 extremities intact Psychiatric:  . Mental status o Mood, affect appropriate o Orientation to person, place, time  . judgment and insight appear intact   I have personally reviewed the following:   Today's Data  .  Orthoptist  . Blood Cultures x 2 (04/29/2020): Positive for Staph Aureus . Blood Cultures x 2 (05/03/2020): No growth  Imaging  . CXR Diffuse right and patchy left consolidate consistent with pneumonia (04/28/2020)  Scheduled Meds: . buprenorphine-naloxone  1 tablet Sublingual BID  . carvedilol  3.125 mg Oral BID WC  . Chlorhexidine Gluconate Cloth  6 each Topical Q0600  . dextromethorphan-guaiFENesin  1 tablet Oral BID  . lidocaine  1 patch Transdermal Q24H  . magnesium oxide  400 mg Oral BID  . mupirocin  ointment  1 application Nasal BID  . QUEtiapine  50 mg Oral BID   Continuous Infusions: . sodium chloride Stopped (05/02/20 2205)  . 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  Patient met criteria for sepsis on admission with tachypnea, tachycardia, leukocytosis, concern for untreated bacteremia.  Repeat 2D echo which was done showed worsening/severe tricuspid valvular regurgitation, tricuspid valve vegetation attached to posterior leaflet measuring 0.9 cm x 0.5 cm more apparent on this study the last study.  Severely dilated right atrial size.  Hyperdynamic right ventricular systolic function.  Continue empiric IV vancomycin.  CT surgery was consulted 04/28/2020 for possible valve repair however due to ongoing IV drug abuse patient at high risk for reinfection if she were to undergo valve replacement and recommending focusing on drug rehabilitation.  Per CT surgery obtaining a TEE will not change surgical candidacy and recommending medical management for now.  Repeat blood cultures from 04/29/2028 + for staph aureus.  Repeat blood cultures drawn on 05/03/2020. Will monitor.  Continue IV vancomycin.  ID following.  Acute hypoxemic respiratory distress secondary to right-sided tension pneumothorax status post chest tube placement: Chest tube now removed. 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  Chest tube removed 04/28/2020 per PCCM.  Repeat chest x-ray done showed no recurrent pneumothorax.  Pulmonary hygiene.  Mucinex added due to ongoing cough.  Place on Hycodan as needed.  Supportive care.  Tachycardia:   Polysubstance abuse/heroin use: UDS noted at outside hospital to be positive for amphetamines. Patient noted to have multiple admissions for endocarditis, where she has left AMA . Patient seen by psychiatry on 04/27/2020 and is felt patient lacks capacity to make an informed medical decision regarding her medical care and disposition and recommending social work evaluation to contact next of kin no appropriate hospital administrator to help with decision-making process. Continue Suboxone.  Was on clonidine taper/detox protocol. Patient wanted to leave AMA again (04/29/2020). Patient however did state she understands the risk of leaving AMA which include death. Was also felt by PCCM that patient lacked capacity poor insight into her current medical situation. Patient with poor insight into her current medical situation.  Patient not thinking rationally. Patient has subsequently been IVC'd to finish out treatment for MRSA disseminated infection/endocarditis  with worsening tricuspid regurgitation. Patient deemed not a surgical candidate due to ongoing IV drug use. Patient has been reassessed by psych who are in agreement that patient lacks capacity.    Anemia of chronic disease: Anemia panel consistent with anemia of chronic disease.  Status post transfusion 1 unit packed red blood cells.  Hemoglobin currently at 8.2 from 7.4 from 7.2 from 7.9 from 7.0.  DIC panel negative for schistocytes. Transfusion threshold hemoglobin <7.  Tachycardia: Likely secondary to problems # 1,2,3,4.  Continue treatment with IV antibiotics.  Patient s/p transfusion 1 unit of packed red blood cells.  Lopressor as needed for heart rate > 130.  Will place on Coreg 3.125 mg p.o. twice  daily. Persistent regardless of volume status. Likely due to chronic IV cocaine and heroin abuse. Will check CTA chest to rule out PE as the patient has been fairly immobile as inpatient and therefore is at increased risk for PE.  Hypomagnesemia/hypokalemia: Magnesium at 1. this morning. We will give magnesium sulfate 4 g IV x1 again. Continue oral supplementation. Potassium of 4.0. Follow.  Positive hep C antibody/transaminitis: HCV RNA quant of 2260.  Outpatient follow-up with ID.   I have seen and examined this patient myself. I have spent 30 minutes in her evaluation and care.  DVT prophylaxis: SCDs Code Status: Full Family Communication: No family at bedside. Disposition:   Status is: Inpatient    Dispo: The patient is from: Home  Anticipated d/c is to: To be determined  Anticipated d/c date is: Once IV antibiotics have been completed.  Needs antibiotics for 6 weeks.  When cleared by ID.  Patient currently on IV antibiotics, with tricuspid endocarditis, being followed by ID.  Patient has now been IVC'D.  Patient currently not safe to be discharged home as patient has left multiple times AMA and now has worsening tricuspid regurg with tricuspid endocarditis with interrupted treatments.  Patient at high risk for decompensation.Karie Kirks, DO [facility] Direct contact: see [email or web address]  7PM-7AM contact night coverage as above 05/06/2020, 4:39 PM  LOS: 7 days

## 2020-05-06 NOTE — Progress Notes (Signed)
Critical vancomycin trough 29. Labs drawn during infusion.  Pharmacy notified

## 2020-05-06 NOTE — Progress Notes (Addendum)
Pharmacy Antibiotic Note  Candice Jordan is a 19 y.o. female admitted on 04/27/2020 with MRSA bacteremia/TV endocarditis and septic emboli.   Scr stable at 0.6. Afebrile. WBC 12.6. Continues on vancomycin 1g IV every 8 hours -vancomycin level was 26 but this was collected during the infusion   Plan: Continue vancomycin to 1000mg  IV every 8 hours.  Goal trough 15-20 mcg/mL. Recheck vancomycin level on 6/5 at 1pm Will follow renal function, cultures and clinical progress    Height: 5\' 5"  (165.1 cm) Weight: 49.9 kg (110 lb) IBW/kg (Calculated) : 57  Temp (24hrs), Avg:98.4 F (36.9 C), Min:97.4 F (36.3 C), Max:99.5 F (37.5 C)  Recent Labs  Lab 04/29/20 2212 04/30/20 0800 04/30/20 0800 05/01/20 0426 05/01/20 1325 05/02/20 0344 05/03/20 0237 05/04/20 0609 05/06/20 0523 05/06/20 1343  WBC  --  16.3*  --  13.0*  --  11.0* 10.2 12.6*  --   --   CREATININE  --  0.44   < > 0.54  --  0.60 0.64 0.60 0.62  --   VANCOTROUGH   < >  --   --   --  16  --   --   --   --  29*   < > = values in this interval not displayed.    Estimated Creatinine Clearance: 88.4 mL/min (by C-G formula based on SCr of 0.62 mg/dL).    No Known Allergies  Antimicrobials this admission: Vancomycin 5/26>> Zosyn 5/25>>5/26  Dose adjustments this admission: -5/28: VT 8 - change from 500 mg IV every 8 hours to 1000 mg IV every 8 hours -5/30: VT 16 - continue same regimen  Microbiology results: 5/25 Bcx (at OSH): GPC in clusters (2/2) -MRSA  5/26BCx: staph aureus 2/2; BCID w/ MRSA 1/4 5/26UCx: neg 5/26MRSA PCR: pos  5/28 Bcx: staph aurus 2/2 6/1 Bcx: NGTD  Thank you for allowing pharmacy to be a part of this patient's care.  6/28, PharmD Clinical Pharmacist **Pharmacist phone directory can now be found on [email or web address] ([email or web address]).  Listed under [facility].

## 2020-05-07 ENCOUNTER — Inpatient Hospital Stay (HOSPITAL_COMMUNITY): Payer: Medicaid Other

## 2020-05-07 LAB — VANCOMYCIN, TROUGH: Vancomycin Tr: 22 ug/mL (ref 15–20)

## 2020-05-07 MED ORDER — IOHEXOL 350 MG/ML SOLN
75.0000 mL | Freq: Once | INTRAVENOUS | Status: AC | PRN
  Administered 2020-05-07: 75 mL via INTRAVENOUS

## 2020-05-07 MED ORDER — VANCOMYCIN HCL 750 MG/150ML IV SOLN
750.0000 mg | Freq: Three times a day (TID) | INTRAVENOUS | Status: DC
  Administered 2020-05-07 – 2020-05-21 (×42): 750 mg via INTRAVENOUS
  Filled 2020-05-07 (×47): qty 150

## 2020-05-07 NOTE — Progress Notes (Signed)
PROGRESS NOTE  Candice Jordan ZOX:[id number] DOB: Apr 17, 2001 DOA: 04/27/2020 PCP: System, Provider Not In  Brief History   19 year old female presents to [facility] ED on 5/25 with reported 1 week of shortness of breath. Patient was found at [address] with oxygen saturation 79%. Reports using Heroin last night. Diagnosed with MRSA Bacteremia/Endocarditis and COVID 03/08/2020 however has left AMA numerous times throughout the month of April. On arrival to ED. CXR revealing of large right sided tension Pneumothorax. Chest Tube placed. 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 She has been IVC'd. IVC was renewed on 05/05/2020.  The patient has had continual tachycardia since admission. Likely due to [place] secondary to chronic cocaine and heroin abuse. CTA chest has ruled out PE, but does demonstrate moderate bilateral pleural effusions.  Consultants   PCCM  Infectious Disease  Psychiatry  Cardiothoracic surgery  Procedures   None  Antibiotics   Anti-infectives (From admission, onward)   Start     Dose/Rate Route Frequency Ordered Stop   05/07/20 1430  vancomycin (VANCOREADY) IVPB 750 mg/150 mL     750 mg 150 mL/hr over 60 Minutes Intravenous Every 8 hours 05/07/20 1313     05/01/20 1430  vancomycin (VANCOCIN) IVPB 1000 mg/200 mL premix  Status:  Discontinued     1,000 mg 200 mL/hr over 60 Minutes Intravenous Every 8 hours 05/01/20 0946 05/07/20 1313   04/30/20 0600  vancomycin (VANCOCIN) IVPB 1000 mg/200 mL premix  Status:  Discontinued     1,000 mg 200 mL/hr over 60 Minutes Intravenous Every 8 hours 04/29/20 2318 05/01/20 0947   04/29/20 2330  vancomycin (VANCOREADY) IVPB 500 mg/100 mL  Status:  Discontinued     500 mg 100 mL/hr over 60 Minutes Intravenous Every 8 hours 04/29/20 1648  04/29/20 2315   04/27/20 1100  vancomycin (VANCOREADY) IVPB 500 mg/100 mL  Status:  Discontinued     500 mg 100 mL/hr over 60 Minutes Intravenous Every 8 hours 04/27/20 0210 04/29/20 1648   04/27/20 0300  vancomycin (VANCOCIN) IVPB 1000 mg/200 mL premix     1,000 mg 200 mL/hr over 60 Minutes Intravenous  Once 04/27/20 0203 04/27/20 0423   04/27/20 0300  piperacillin-tazobactam (ZOSYN) IVPB 3.375 g  Status:  Discontinued     3.375 g 12.5 mL/hr over 240 Minutes Intravenous Every 8 hours 04/27/20 0210 04/27/20 0856     Subjective  The patient is sleeping soundly. She is not awakened.  Objective   Vitals:  Vitals:   05/07/20 1533 05/07/20 1641  BP: 112/63   Pulse:    Resp: (!) 22   Temp:  98.1 F (36.7 C)  SpO2:     Exam:  Constitutional:   The patient is sleeping soundly. She is not awakened. No acute distress. Respiratory:   No increased work of breathing.  No wheezes, rales, or rhonchi  No tactile fremitus Cardiovascular:   Regular rate and rhythm  No murmurs, ectopy, or gallups.  No lateral PMI. No thrills. Abdomen:   Abdomen is soft, non-tender, non-distended  No hernias, masses, or organomegaly  Normoactive bowel sounds.  Musculoskeletal:   No cyanosis, clubbing, or edema Skin:   No rashes, lesions, ulcers  palpation of skin: no induration or nodules Neurologic:   CN 2-12 intact  Sensation all 4 extremities intact  Psychiatric:   Mental status o Mood, affect appropriate o Orientation to person, place, time   judgment and insight appear intact   I have personally reviewed the following:   Today's Data   Vitals  Micro Data   Blood Cultures x 2 (04/29/2020): Positive for Staph Aureus  Blood Cultures x 2 (05/03/2020): No growth  Imaging   CXR Diffuse right and patchy left consolidate consistent with pneumonia (04/28/2020)  Scheduled Meds:  buprenorphine-naloxone  1 tablet Sublingual BID   carvedilol  3.125 mg Oral BID WC    Chlorhexidine Gluconate Cloth  6 each Topical Q0600   dextromethorphan-guaiFENesin  1 tablet Oral BID   lidocaine  1 patch Transdermal Q24H   magnesium oxide  400 mg Oral BID   mupirocin ointment  1 application Nasal BID   QUEtiapine  50 mg Oral BID   Continuous Infusions:  sodium chloride Stopped (05/02/20 2205)   vancomycin Stopped (05/07/20 1520)    Active Problems:   Endocarditis   Spontaneous tension pneumothorax   AKI (acute kidney injury) ([place])   Sepsis without acute organ dysfunction (HCC)   Pneumothorax   Status post chest tube placement (HCC)   Polysubstance abuse (HCC)   Anemia   Hypokalemia   Hypomagnesemia   Tachycardia   Positive hepatitis C antibody test   LOS: 10 days    A & P  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  Patient met criteria for sepsis on admission with tachypnea, tachycardia, leukocytosis, concern for untreated bacteremia.  Repeat 2D echo which was done showed worsening/severe tricuspid valvular regurgitation, tricuspid valve vegetation attached to posterior leaflet measuring 0.9 cm x 0.5 cm more apparent on this study the last study.  Severely dilated right atrial size.  Hyperdynamic right ventricular systolic function.  Continue empiric IV vancomycin.  CT surgery was consulted 04/28/2020 for possible valve repair however due to ongoing IV drug abuse patient at high risk for reinfection if she were to undergo valve replacement and recommending focusing on drug rehabilitation.  Per CT surgery obtaining a TEE will not change surgical candidacy and recommending medical management for now.  Repeat blood cultures from 04/29/2028 + for staph aureus.  Repeat blood cultures drawn on 05/03/2020. Will monitor.  Continue IV vancomycin.  ID following.  Acute hypoxemic respiratory distress  secondary to right-sided tension pneumothorax status post chest tube placement: Chest tube now removed. 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  Chest tube removed 04/28/2020 per PCCM.  Repeat chest x-ray done showed no recurrent pneumothorax.  Pulmonary hygiene.  Mucinex added due to ongoing cough.  Place on Hycodan as needed.  Supportive care.   Bilateral moderate pleural effusions. CTA chest has demonstrated bilateral moderate pleural effusions. Will monitor and consider thoracentesis, however, the patient's history of tension pneumothorax is concerning for risk of recurrence.  Tachycardia: Likely due to chronic IV abuse of heroin and cocaine. CTA chest negative for PE.   Polysubstance abuse/heroin use: UDS noted at outside hospital to be positive for amphetamines. Patient noted to have multiple admissions for endocarditis, where she has left AMA . Patient seen by psychiatry on 04/27/2020 and is felt patient lacks capacity to make an informed medical decision regarding her medical care and disposition and recommending social work evaluation to contact next of  kin no appropriate hospital administrator to help with decision-making process. Continue Suboxone.  Was on clonidine taper/detox protocol. Patient wanted to leave AMA again (04/29/2020). Patient however did state she understands the risk of leaving AMA which include death. Was also felt by PCCM that patient lacked capacity poor insight into her current medical situation. Patient with poor insight into her current medical situation.  Patient not thinking rationally. Patient has subsequently been IVC'd to finish out treatment for MRSA disseminated infection/endocarditis with worsening tricuspid regurgitation. Patient deemed not a surgical candidate due to ongoing IV drug use. Patient has been reassessed by psych who are in agreement that patient lacks capacity.     Anemia of chronic disease: Anemia panel consistent with anemia of chronic disease.  Status post transfusion 1 unit packed red blood cells.  Hemoglobin currently at 8.2 from 7.4 from 7.2 from 7.9 from 7.0.  DIC panel negative for schistocytes. Transfusion threshold hemoglobin <7.  Tachycardia: Likely secondary to problems # 1,2,3,4.  Continue treatment with IV antibiotics.  Patient s/p transfusion 1 unit of packed red blood cells.  Lopressor as needed for heart rate > 130.  Will place on Coreg 3.125 mg p.o. twice daily. Persistent regardless of volume status. Likely due to chronic IV cocaine and heroin abuse. Will check CTA chest to rule out PE as the patient has been fairly immobile as inpatient and therefore is at increased risk for PE.  Hypomagnesemia/hypokalemia: Magnesium at 1. this morning. We will give magnesium sulfate 4 g IV x1 again. Continue oral supplementation. Potassium of 4.0. Follow.  Positive hep C antibody/transaminitis: HCV RNA quant of 2260.  Outpatient follow-up with ID.   I have seen and examined this patient myself. I have spent 32 minutes in her evaluation and care.  DVT prophylaxis: SCDs Code Status: Full Family Communication: No family at bedside. Disposition:   Status is: Inpatient    Dispo: The patient is from: Home  Anticipated d/c is to: To be determined  Anticipated d/c date is: Once IV antibiotics have been completed.  Needs antibiotics for 6 weeks.  When cleared by ID.  Patient currently on IV antibiotics, with tricuspid endocarditis, being followed by ID.  Patient has now been IVC'D.  Patient currently not safe to be discharged home as patient has left multiple times AMA and now has worsening tricuspid regurg with tricuspid endocarditis with interrupted treatments.  Patient at high risk for decompensation.Karie Kirks, DO [facility] Direct contact: see [email or web address]  7PM-7AM contact night coverage  as above 05/07/2020, 4:59 PM  LOS: 7 days

## 2020-05-07 NOTE — Plan of Care (Signed)
  Problem: Clinical Measurements: Goal: Ability to maintain clinical measurements within normal limits will improve Outcome: Progressing   

## 2020-05-07 NOTE — Progress Notes (Addendum)
Pharmacy Antibiotic Note  Candice Jordan is a 19 y.o. female admitted on 04/27/2020 with MRSA bacteremia/TV endocarditis and septic emboli.   Scr stable at 0.62. Afebrile. WBC down to 10. Vanc trough remains elevated at 22 on 1000 mg IV Q8H. (VT drawn at 12:16, last dose of Vanc 0622 on 6/5). Will reduce dose to 750 mg IV Q8H to aim for goal trough 15-20 mcg/ml  Plan: Reduce vancomycin to 750 mg IV every 8 hours.  Goal trough 15-20 mcg/mL. Check vanc levels as needed Will follow renal function, cultures and clinical progress   Height: 5\' 5"  (165.1 cm) Weight: 49.9 kg (109 lb 14.4 oz) IBW/kg (Calculated) : 57  Temp (24hrs), Avg:98.3 F (36.8 C), Min:97.9 F (36.6 C), Max:99 F (37.2 C)  Recent Labs  Lab 05/01/20 0426 05/01/20 1325 05/02/20 0344 05/03/20 0237 05/04/20 0609 05/06/20 0523 05/06/20 1343 05/07/20 1216  WBC 13.0*  --  11.0* 10.2 12.6*  --   --   --   CREATININE 0.54  --  0.60 0.64 0.60 0.62  --   --   VANCOTROUGH  --    < >  --   --   --   --  29* 22*   < > = values in this interval not displayed.    Estimated Creatinine Clearance: 88.4 mL/min (by C-G formula based on SCr of 0.62 mg/dL).    No Known Allergies  Antimicrobials this admission: Vancomycin 5/26>> Zosyn 5/25>>5/26  Dose adjustments this admission: -5/28: VT 8 - change from 500 mg IV every 8 hours to 1000 mg IV every 8 hours -5/30: VT 16 - continue same regimen -6/4: VT 29 - collected during infusion, ordered another VT for 6/5 -6/5: VT 22 - Change from 1000 mg IV Q8H to 750 mg IV Q8H  Microbiology results: 5/25 Bcx (at OSH): GPC in clusters (2/2) -MRSA  5/26BCx: staph aureus 2/2; BCID w/ MRSA 1/4 5/26UCx: neg 5/26MRSA PCR: pos  5/28 Bcx: staph aurus 2/2 6/1 Bcx: NGTD  Thank you for allowing pharmacy to be a part of this patients care.  8/1, PharmD PGY1 Ambulatory Care Resident [employer] # [phone]

## 2020-05-08 LAB — CULTURE, BLOOD (ROUTINE X 2)
Culture: NO GROWTH
Culture: NO GROWTH
Special Requests: ADEQUATE
Special Requests: ADEQUATE

## 2020-05-08 NOTE — Progress Notes (Signed)
PROGRESS NOTE  Candice Jordan QQI:[id number] DOB: [birth year] DOA: 04/27/2020 PCP: System, Provider Not In  Brief History   19 year old female presents to [facility] ED on 5/25 with reported 1 week of shortness of breath. Patient was found at [address] with oxygen saturation 79%. Reports using Heroin last night. Diagnosed with MRSA Bacteremia/Endocarditis and COVID 03/08/2020 however has left AMA numerous times throughout the month of April. On arrival to ED. CXR revealing of large right sided tension Pneumothorax. Chest Tube placed. 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 She has been IVC'd. IVC was renewed on 05/05/2020.  The patient has had continual tachycardia since admission. Likely due to [place] secondary to chronic cocaine and heroin abuse. CTA chest has ruled out PE, but does demonstrate moderate bilateral pleural effusions.  Consultants  . PCCM . Infectious Disease . Psychiatry . Cardiothoracic surgery  Procedures  . None  Antibiotics   Anti-infectives (From admission, onward)   Start     Dose/Rate Route Frequency Ordered Stop   05/07/20 1430  vancomycin (VANCOREADY) IVPB 750 mg/150 mL     750 mg 150 mL/hr over 60 Minutes Intravenous Every 8 hours 05/07/20 1313     05/01/20 1430  vancomycin (VANCOCIN) IVPB 1000 mg/200 mL premix  Status:  Discontinued     1,000 mg 200 mL/hr over 60 Minutes Intravenous Every 8 hours 05/01/20 0946 05/07/20 1313   04/30/20 0600  vancomycin (VANCOCIN) IVPB 1000 mg/200 mL premix  Status:  Discontinued     1,000 mg 200 mL/hr over 60 Minutes Intravenous Every 8 hours 04/29/20 2318 05/01/20 0947   04/29/20 2330  vancomycin (VANCOREADY) IVPB 500 mg/100 mL  Status:  Discontinued     500 mg 100 mL/hr over 60 Minutes Intravenous Every 8 hours 04/29/20 1648  04/29/20 2315   04/27/20 1100  vancomycin (VANCOREADY) IVPB 500 mg/100 mL  Status:  Discontinued     500 mg 100 mL/hr over 60 Minutes Intravenous Every 8 hours 04/27/20 0210 04/29/20 1648   04/27/20 0300  vancomycin (VANCOCIN) IVPB 1000 mg/200 mL premix     1,000 mg 200 mL/hr over 60 Minutes Intravenous  Once 04/27/20 0203 04/27/20 0423   04/27/20 0300  piperacillin-tazobactam (ZOSYN) IVPB 3.375 g  Status:  Discontinued     3.375 g 12.5 mL/hr over 240 Minutes Intravenous Every 8 hours 04/27/20 0210 04/27/20 0856     Subjective  The patient is sleeping soundly. She is not awakened.  Objective   Vitals:  Vitals:   05/08/20 0900 05/08/20 1300  BP: 97/61 100/67  Pulse: (!) 124 (!) 110  Resp: 20 18  Temp:  98.4 F (36.9 C)  SpO2: 97% 98%   Exam:  Constitutional:  . The patient is sleeping soundly. She is not awakened. No acute distress. Respiratory:  . No increased work of breathing. . No wheezes, rales, or rhonchi . No tactile fremitus Cardiovascular:  . Regular rate and rhythm . No murmurs, ectopy, or gallups. . No lateral PMI. No thrills. Abdomen:  . Abdomen is soft, non-tender, non-distended . No hernias, masses, or organomegaly . Normoactive bowel sounds.  Musculoskeletal:  . No cyanosis, clubbing, or edema Skin:  . No rashes, lesions, ulcers . palpation of skin: no induration or nodules Neurologic:  . CN 2-12 intact . Sensation all 4 extremities  intact Psychiatric:  . Mental status o Mood, affect appropriate o Orientation to person, place, time  . judgment and insight appear intact   I have personally reviewed the following:   Today's Data  . Orthoptist  . Blood Cultures x 2 (04/29/2020): Positive for Staph Aureus . Blood Cultures x 2 (05/03/2020): No growth  Imaging  . CXR Diffuse right and patchy left consolidate consistent with pneumonia (04/28/2020)  Scheduled Meds: . buprenorphine-naloxone  1 tablet Sublingual BID  . carvedilol  3.125  mg Oral BID WC  . dextromethorphan-guaiFENesin  1 tablet Oral BID  . lidocaine  1 patch Transdermal Q24H  . magnesium oxide  400 mg Oral BID  . QUEtiapine  50 mg Oral BID   Continuous Infusions: . sodium chloride Stopped (05/02/20 2205)  . 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  Patient met criteria for sepsis on admission with tachypnea, tachycardia, leukocytosis, concern for untreated bacteremia.  Repeat 2D echo which was done showed worsening/severe tricuspid valvular regurgitation, tricuspid valve vegetation attached to posterior leaflet measuring 0.9 cm x 0.5 cm more apparent on this study the last study.  Severely dilated right atrial size.  Hyperdynamic right ventricular systolic function.  Continue empiric IV vancomycin.  CT surgery was consulted 04/28/2020 for possible valve repair however due to ongoing IV drug abuse patient at high risk for reinfection if she were to undergo valve replacement and recommending focusing on drug rehabilitation.  Per CT surgery obtaining a TEE will not change surgical candidacy and recommending medical management for now.  Repeat blood cultures from 04/29/2028 + for staph aureus.  Repeat blood cultures drawn on 05/03/2020. Will monitor.  Continue IV vancomycin.  ID following.  Acute hypoxemic respiratory distress secondary to right-sided tension pneumothorax status post chest tube placement: Chest  tube now removed. 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  Chest tube removed 04/28/2020 per PCCM.  Repeat chest x-ray done showed no recurrent pneumothorax.  Pulmonary hygiene.  Mucinex added due to ongoing cough.  Place on Hycodan as needed. Supportive care.   Bilateral moderate pleural effusions. CTA chest has demonstrated bilateral moderate pleural effusions. Will monitor and consider thoracentesis, however, the patient's history of tension pneumothorax is concerning for risk of recurrence. Will discuss with IR in the morning.   Tachycardia: Likely due to chronic IV abuse of heroin and cocaine. CTA chest negative for PE.   Polysubstance abuse/heroin use: UDS noted at outside hospital to be positive for amphetamines. Patient noted to have multiple admissions for endocarditis, where she has left AMA . Patient seen by psychiatry on 04/27/2020 and is felt patient lacks capacity to make an informed medical decision regarding her medical care and disposition and recommending social work evaluation to contact next of kin no appropriate hospital administrator to help with decision-making process.  Continue Suboxone.  Was on clonidine taper/detox protocol. Patient wanted to leave AMA again (04/29/2020). Patient however did state she understands the risk of leaving AMA which include death. Was also felt by PCCM that patient lacked capacity poor insight into her current medical situation. Patient with poor insight into her current medical situation.  Patient not thinking rationally. Patient has subsequently been IVC'd to finish out treatment for MRSA disseminated infection/endocarditis with worsening tricuspid regurgitation. Patient deemed not a surgical candidate due to ongoing IV drug use. Patient has been reassessed by psych who are in agreement that patient lacks capacity.    Anemia of chronic disease: Anemia panel consistent with  anemia of chronic disease.  Status post transfusion 1 unit packed red blood cells.  Hemoglobin currently at 8.2 from 7.4 from 7.2 from 7.9 from 7.0.  DIC panel negative for schistocytes. Transfusion threshold hemoglobin <7.  Tachycardia: Likely secondary to problems # 1,2,3,4.  Continue treatment with IV antibiotics.  Patient s/p transfusion 1 unit of packed red blood cells.  Lopressor as needed for heart rate > 130.  Will place on Coreg 3.125 mg p.o. twice daily. Persistent regardless of volume status. Likely due to chronic IV cocaine and heroin abuse. Will check CTA chest to rule out PE as the patient has been fairly immobile as inpatient and therefore is at increased risk for PE.  Hypomagnesemia/hypokalemia: Magnesium at 1. this morning. We will give magnesium sulfate 4 g IV x1 again. Continue oral supplementation. Potassium of 4.0. Follow.  Positive hep C antibody/transaminitis: HCV RNA quant of 2260.  Outpatient follow-up with ID.   I have seen and examined this patient myself. I have spent 30 minutes in her evaluation and care.  DVT prophylaxis: SCDs Code Status: Full Family Communication: No family at bedside. Disposition:   Status is: Inpatient  Dispo: The patient is from: Home  Anticipated d/c is to: To be determined  Anticipated d/c date is: Once IV antibiotics have been completed.  Needs antibiotics for 6 weeks.  When cleared by ID.  Patient currently on IV antibiotics, with tricuspid endocarditis, being followed by ID.  Patient has now been IVC'D.  Patient currently not safe to be discharged home as patient has left multiple times AMA and now has worsening tricuspid regurg with tricuspid endocarditis with interrupted treatments.  Patient at high risk for decompensation.Karie Kirks, DO [facility] Direct contact: see [email or web address]  7PM-7AM contact night coverage as above 05/08/2020, 5:04 PM  LOS: 7 days

## 2020-05-09 ENCOUNTER — Encounter (HOSPITAL_COMMUNITY): Payer: Self-pay | Admitting: Pulmonary Disease

## 2020-05-09 DIAGNOSIS — R7881 Bacteremia: Secondary | ICD-10-CM

## 2020-05-09 DIAGNOSIS — F1410 Cocaine abuse, uncomplicated: Secondary | ICD-10-CM

## 2020-05-09 DIAGNOSIS — B9562 Methicillin resistant Staphylococcus aureus infection as the cause of diseases classified elsewhere: Secondary | ICD-10-CM

## 2020-05-09 DIAGNOSIS — J869 Pyothorax without fistula: Secondary | ICD-10-CM

## 2020-05-09 DIAGNOSIS — F1110 Opioid abuse, uncomplicated: Secondary | ICD-10-CM

## 2020-05-09 NOTE — Progress Notes (Signed)
Pt has ring and phone charger at bedside.These belongings were placed in small bag with patient label and tranferred with patient to 60M.   Pt's phone was placed in bag with pt label and transferred to 60M. Pt aware.

## 2020-05-09 NOTE — Progress Notes (Addendum)
PROGRESS NOTE  Candice Jordan GTX:[id number] DOB: 10-17-2001 DOA: 04/27/2020 PCP: System, Provider Not In  Brief History   19 year old female presents to [facility] ED on 5/25 with reported 1 week of shortness of breath. Patient was found at [address] with oxygen saturation 79%. Reports using Heroin last night. Diagnosed with MRSA Bacteremia/Endocarditis and COVID 03/08/2020 however has left AMA numerous times throughout the month of April. On arrival to ED. CXR revealing of large right sided tension Pneumothorax. Chest Tube placed. 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 She has been IVC'd. IVC was renewed on 05/05/2020.  The patient has had continual tachycardia since admission. Likely due to [place] secondary to chronic cocaine and heroin abuse. CTA chest has ruled out PE, but does demonstrate moderate bilateral pleural effusions.  Consultants  . PCCM . Infectious Disease . Psychiatry . Cardiothoracic surgery  Procedures  . None  Antibiotics   Anti-infectives (From admission, onward)   Start     Dose/Rate Route Frequency Ordered Stop   05/07/20 1430  vancomycin (VANCOREADY) IVPB 750 mg/150 mL     750 mg 150 mL/hr over 60 Minutes Intravenous Every 8 hours 05/07/20 1313     05/01/20 1430  vancomycin (VANCOCIN) IVPB 1000 mg/200 mL premix  Status:  Discontinued     1,000 mg 200 mL/hr over 60 Minutes Intravenous Every 8 hours 05/01/20 0946 05/07/20 1313   04/30/20 0600  vancomycin (VANCOCIN) IVPB 1000 mg/200 mL premix  Status:  Discontinued     1,000 mg 200 mL/hr over 60 Minutes Intravenous Every 8 hours 04/29/20 2318 05/01/20 0947   04/29/20 2330  vancomycin (VANCOREADY) IVPB 500 mg/100 mL  Status:  Discontinued     500 mg 100 mL/hr over 60 Minutes Intravenous Every 8 hours 04/29/20 1648  04/29/20 2315   04/27/20 1100  vancomycin (VANCOREADY) IVPB 500 mg/100 mL  Status:  Discontinued     500 mg 100 mL/hr over 60 Minutes Intravenous Every 8 hours 04/27/20 0210 04/29/20 1648   04/27/20 0300  vancomycin (VANCOCIN) IVPB 1000 mg/200 mL premix     1,000 mg 200 mL/hr over 60 Minutes Intravenous  Once 04/27/20 0203 04/27/20 0423   04/27/20 0300  piperacillin-tazobactam (ZOSYN) IVPB 3.375 g  Status:  Discontinued     3.375 g 12.5 mL/hr over 240 Minutes Intravenous Every 8 hours 04/27/20 0210 04/27/20 0856     Subjective  The patient is sleeping soundly. She is not awakened.  Objective   Vitals:  Vitals:   05/09/20 0803 05/09/20 1144  BP: 108/73 102/68  Pulse: (!) 115 (!) 115  Resp: 18 (!) 24  Temp: 98.4 F (36.9 C) 98.1 F (36.7 C)  SpO2: 95%    Exam:  Constitutional:  . The patient is sleeping soundly. She is not awakened. No acute distress. Respiratory:  . No increased work of breathing. . No wheezes, rales, or rhonchi . No tactile fremitus Cardiovascular:  . Regular rate and rhythm . No murmurs, ectopy, or gallups. . No lateral PMI. No thrills. Abdomen:  . Abdomen is soft, non-tender, non-distended . No hernias, masses, or organomegaly . Normoactive bowel sounds.  Musculoskeletal:  . No cyanosis, clubbing, or edema Skin:  . No rashes, lesions, ulcers . palpation of skin: no induration or nodules Neurologic:  . CN 2-12 intact .  Sensation all 4 extremities intact Psychiatric:  . Mental status o Mood, affect appropriate o Orientation to person, place, time  . judgment and insight appear intact   I have personally reviewed the following:   Today's Data  . Orthoptist  . Blood Cultures x 2 (04/29/2020): Positive for Staph Aureus . Blood Cultures x 2 (05/03/2020): No growth  Imaging  . CXR Diffuse right and patchy left consolidate consistent with pneumonia (04/28/2020)  Scheduled Meds: . buprenorphine-naloxone  1 tablet Sublingual BID  .  carvedilol  3.125 mg Oral BID WC  . dextromethorphan-guaiFENesin  1 tablet Oral BID  . lidocaine  1 patch Transdermal Q24H  . magnesium oxide  400 mg Oral BID  . QUEtiapine  50 mg Oral BID   Continuous Infusions: . sodium chloride Stopped (05/02/20 2205)  . 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  Patient met criteria for sepsis on admission with tachypnea, tachycardia, leukocytosis, concern for untreated bacteremia.  Repeat 2D echo which was done showed worsening/severe tricuspid valvular regurgitation, tricuspid valve vegetation attached to posterior leaflet measuring 0.9 cm x 0.5 cm more apparent on this study the last study.  Severely dilated right atrial size.  Hyperdynamic right ventricular systolic function.  Continue empiric IV vancomycin.  CT surgery was consulted 04/28/2020 for possible valve repair however due to ongoing IV drug abuse patient at high risk for reinfection if she were to undergo valve replacement and recommending focusing on drug rehabilitation.  Per CT surgery obtaining a TEE will not change surgical candidacy and recommending medical management for now.  Repeat blood cultures from 04/29/2028 + for staph aureus.  Repeat blood cultures drawn on 05/03/2020. Will monitor.  Continue IV vancomycin.  ID following.  Acute hypoxemic respiratory distress secondary to right-sided tension pneumothorax status post chest tube  placement: Chest tube now removed. 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  Chest tube removed 04/28/2020 per PCCM.  Repeat chest x-ray done showed no recurrent pneumothorax.  Pulmonary hygiene.  Mucinex added due to ongoing cough.  Place on Hycodan as needed. Supportive care.   Bilateral moderate pleural effusions. CTA chest has demonstrated bilateral moderate pleural effusions. Will monitor and consider thoracentesis, however, the patient's history of tension pneumothorax is concerning for risk of recurrence.  I have discussed the patient with Dr. Larena Glassman. He states that in his estimation the pleural effusions are small and do not warrant intervention at this time.  Tachycardia: Likely due to chronic IV abuse of heroin and cocaine. CTA chest negative for PE.   Polysubstance abuse/heroin use: UDS noted at outside hospital to be positive for amphetamines. Patient noted to have multiple admissions for endocarditis, where she has left AMA . Patient seen by psychiatry on 04/27/2020 and is felt patient lacks capacity to make an informed medical decision  regarding her medical care and disposition and recommending social work evaluation to contact next of kin no appropriate hospital administrator to help with decision-making process. Continue Suboxone.  Was on clonidine taper/detox protocol. Patient wanted to leave AMA again (04/29/2020). Patient however did state she understands the risk of leaving AMA which include death. Was also felt by PCCM that patient lacked capacity poor insight into her current medical situation. Patient with poor insight into her current medical situation.  Patient not thinking rationally. Patient has subsequently been IVC'd to finish out treatment for MRSA disseminated infection/endocarditis with worsening tricuspid regurgitation. Patient deemed not a surgical candidate due to ongoing IV drug use.  Patient has been reassessed by psych who are in agreement that patient lacks capacity.    Anemia of chronic disease: Anemia panel consistent with anemia of chronic disease.  Status post transfusion 1 unit packed red blood cells.  Hemoglobin currently at 8.2 from 7.4 from 7.2 from 7.9 from 7.0.  DIC panel negative for schistocytes. Transfusion threshold hemoglobin <7.  Tachycardia: Likely secondary to problems # 1,2,3,4.  Continue treatment with IV antibiotics.  Patient s/p transfusion 1 unit of packed red blood cells.  Lopressor as needed for heart rate > 130.  Will place on Coreg 3.125 mg p.o. twice daily. Persistent regardless of volume status. Likely due to chronic IV cocaine and heroin abuse. Will check CTA chest to rule out PE as the patient has been fairly immobile as inpatient and therefore is at increased risk for PE.  Hypomagnesemia/hypokalemia: Magnesium at 1. this morning. We will give magnesium sulfate 4 g IV x1 again. Continue oral supplementation. Potassium of 4.0. Follow.  Positive hep C antibody/transaminitis: HCV RNA quant of 2260.  Outpatient follow-up with ID.   I have seen and examined this patient myself. I have spent 32 minutes in her evaluation and care.  DVT prophylaxis: SCDs Code Status: Full Family Communication: No family at bedside. Disposition:   Status is: Inpatient  Dispo: The patient is from: Home  Anticipated d/c is to: To be determined  Anticipated d/c date is: Once IV antibiotics have been completed.  Needs antibiotics for 6 weeks.  When cleared by ID.  Patient currently on IV antibiotics, with tricuspid endocarditis, being followed by ID.  Patient has now been IVC'D.  Patient currently not safe to be discharged home as patient has left multiple times AMA and now has worsening tricuspid regurg with tricuspid endocarditis with interrupted treatments.  Patient at high risk for decompensation.Karie Kirks, DO [facility] Direct contact: see [email or web address]  7PM-7AM contact night coverage as above 05/09/2020, 2:34 PM  LOS: 7 days

## 2020-05-09 NOTE — Progress Notes (Signed)
°    [facility]   Reason for visit: Follow up on endocarditis  Interval History: no new positive cultures. Afebrile.  Sleeping   Physical Exam: Constitutional:  Vitals:   05/09/20 0803 05/09/20 1144  BP: 108/73 102/68  Pulse: (!) 115 (!) 115  Resp: 18 (!) 24  Temp: 98.4 F (36.9 C) 98.1 F (36.7 C)  SpO2: 95%    patient appears in NAD Respiratory: Normal respiratory effort; CTA B Cardiovascular: RRR  Review of Systems: Constitutional: negative for fevers  Lab Results  Component Value Date   WBC 12.6 (H) 05/04/2020   HGB 8.4 (L) 05/04/2020   HCT 28.6 (L) 05/04/2020   MCV 92.0 05/04/2020   PLT 238 05/04/2020    Lab Results  Component Value Date   CREATININE 0.62 05/06/2020   BUN 13 05/06/2020   NA 133 (L) 05/06/2020   K 4.0 05/06/2020   CL 100 05/06/2020   CO2 24 05/06/2020    Lab Results  Component Value Date   ALT 79 (H) 05/01/2020   AST 63 (H) 05/01/2020   ALKPHOS 128 (H) 05/01/2020     Microbiology: Recent Results (from the past 240 hour(s))  Culture, blood (Routine X 2) w Reflex to ID Panel     Status: None   Collection Time: 05/03/20  2:57 PM   Specimen: BLOOD RIGHT HAND  Result Value Ref Range Status   Specimen Description BLOOD RIGHT HAND  Final   Special Requests   Final    BOTTLES DRAWN AEROBIC ONLY Blood Culture adequate volume   Culture   Final    NO GROWTH 5 DAYS Performed at [facility], [address] [place], [place] [address]    Report Status 05/08/2020 FINAL  Final  Culture, blood (Routine X 2) w Reflex to ID Panel     Status: None   Collection Time: 05/03/20  2:57 PM   Specimen: BLOOD  Result Value Ref Range Status   Specimen Description BLOOD RIGHT ANTECUBITAL  Final   Special Requests   Final    BOTTLES DRAWN AEROBIC ONLY Blood Culture adequate volume   Culture   Final    NO GROWTH 5 DAYS Performed at [facility], [address] [place], [place] [address]    Report Status  05/08/2020 FINAL  Final    Impression/Plan:  1. MRSA TV endocarditis with empyema - continue with vancomcycin.  Will need a prolonged course of 6 weeks. She is a very poor candidate for home antibiotics therapy, even oral therapy as her compliance is likely to be poor and her TV dysfunction is quite severe.   She has been seen by Dr. Cliffton Asters of [facility] and no surgery planned.    2.  Substance abuse - heroin, cocaine use.  Was living in a hotel.  She has been IVCed due to her poor self care.  Followed by psychiatry.    3.  Tension pneumothorax - had a chest tube, now removed.    Will continue to follow intermittently

## 2020-05-10 ENCOUNTER — Other Ambulatory Visit: Payer: Self-pay

## 2020-05-10 LAB — BASIC METABOLIC PANEL
Anion gap: 8 (ref 5–15)
BUN: 16 mg/dL (ref 6–20)
CO2: 25 mmol/L (ref 22–32)
Calcium: 8.6 mg/dL — ABNORMAL LOW (ref 8.9–10.3)
Chloride: 101 mmol/L (ref 98–111)
Creatinine, Ser: 0.54 mg/dL (ref 0.44–1.00)
GFR calc Af Amer: >60 mL/min (ref >60)
GFR calc non Af Amer: >60 mL/min (ref >60)
Glucose, Bld: 100 mg/dL — ABNORMAL HIGH (ref 70–99)
Potassium: 4.2 mmol/L (ref 3.5–5.1)
Sodium: 134 mmol/L — ABNORMAL LOW (ref 135–145)

## 2020-05-10 LAB — CBC
HCT: 29.6 % — ABNORMAL LOW (ref 36.0–46.0)
Hemoglobin: 8.8 g/dL — ABNORMAL LOW (ref 12.0–15.0)
MCH: 27.3 pg (ref 26.0–34.0)
MCHC: 29.7 g/dL — ABNORMAL LOW (ref 30.0–36.0)
MCV: 91.9 fL (ref 80.0–100.0)
Platelets: 256 10*3/uL (ref 150–400)
RBC: 3.22 MIL/uL — ABNORMAL LOW (ref 3.87–5.11)
RDW: 18.3 % — ABNORMAL HIGH (ref 11.5–15.5)
WBC: 10.6 10*3/uL — ABNORMAL HIGH (ref 4.0–10.5)
nRBC: 0 % (ref 0.0–0.2)

## 2020-05-10 LAB — VANCOMYCIN, TROUGH: Vancomycin Tr: 14 ug/mL — ABNORMAL LOW (ref 15–20)

## 2020-05-10 NOTE — Progress Notes (Signed)
Pharmacy Antibiotic Note  Candice Jordan is a 19 y.o. female admitted on 04/27/2020 with MRSA bacteremia/TV endocarditis and septic emboli.   Scr stable at 0.54. Afebrile. WBC stable at 10.6. Vancomycin was reduced to 750 mg every 8 hours on 6/5.  Repeat trough tonight is 14 mcg/ml but expect some accumulation will happen. Plan treatment through 7/13  Plan: Continue Vancomycin to 750 mg IV every 8 hours.   Plan repeat vanc trough in a few days.  Will follow renal function and clinical progress   Height: 5\' 5"  (165.1 cm) Weight: 50.7 kg (111 lb 11.2 oz) IBW/kg (Calculated) : 57  Temp (24hrs), Avg:98.5 F (36.9 C), Min:97.8 F (36.6 C), Max:99.3 F (37.4 C)  Recent Labs  Lab 05/04/20 0609 05/06/20 0523 05/06/20 1343 05/07/20 1216 05/10/20 0323 05/10/20 1527  WBC 12.6*  --   --   --  10.6*  --   CREATININE 0.60 0.62  --   --  0.54  --   VANCOTROUGH  --   --    < > 22*  --  14*   < > = values in this interval not displayed.    Estimated Creatinine Clearance: 89.8 mL/min (by C-G formula based on SCr of 0.54 mg/dL).    No Known Allergies  Antimicrobials this admission: Vancomycin 5/26>> Zosyn 5/25>>5/26  Dose adjustments this admission: -5/28: VT 8 - change from 500 mg IV every 8 hours to 1000 mg IV every 8 hours -5/30: VT 16 - continue same regimen -6/4: VT 29 - collected during infusion, ordered another VT for 6/5 -6/5: VT 22 - Change from 1000 mg IV Q8H to 750 mg IV Q8H -6/8: VT 14-continue 750mg  IV q8h for now  Microbiology results: 5/25 Bcx (at OSH): GPC in clusters (2/2) -MRSA  5/26BCx: staph aureus 2/2; BCID w/ MRSA 1/4 5/26UCx: neg 5/26MRSA PCR: pos  5/28 Bcx: staph aurus 2/2 6/1 Bcx: NGTD  Karlis Cregg A. 6/28, PharmD, BCPS, FNKF Clinical Pharmacist [facility] Please utilize Amion for appropriate phone number to reach the unit pharmacist [facility])   05/10/2020 4:36 PM

## 2020-05-10 NOTE — Progress Notes (Signed)
Physical Therapy Treatment Patient Details Name: Candice Jordan MRN: [id number] DOB: 11-27-2001 Today's Date: 05/10/2020    History of Present Illness Patient is a 19 year old female presenting to [facility] on 5/25 with reported 1 week of shortness of breath. On arrival to ED CXR revealing of large right sided tension Pneumothorax. Chest Tube placed. 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 No longer requiring chest tube. History of MRSA Bacteremia/Endocarditis. COVID-19. Polysubstance Abuse.     PT Comments    Continuing work on functional mobility and activity tolerance;  Notably better steadiness with amb; cues to self-monitor for activity tolerance, and no dizziness noted; HR max observed was 129 with hallway amb, DOE noted 2/4; good progress towards goals  Follow Up Recommendations  No PT follow up     Equipment Recommendations  None recommended by PT    Recommendations for Other Services (would benefit from social work re: drug rehab)     Precautions / Restrictions Precautions Precautions: Other (comment) Precaution Comments: sitter present due to IVC; watch HR with activity    Mobility  Bed Mobility Overal bed mobility: Modified Independent Bed Mobility: Supine to Sit     Supine to sit: Modified independent (Device/Increase time)        Transfers Overall transfer level: Needs assistance Equipment used: None Transfers: Sit to/from Stand Sit to Stand: Supervision         General transfer comment: For safety, noting improving stability without need to brace LEs against bed  Ambulation/Gait Ambulation/Gait assistance: Supervision Gait Distance (Feet): 300 Feet Assistive device: None Gait Pattern/deviations: Step-through pattern;Decreased stride length;Drifts right/left     General Gait Details: No scissoring noted today; HR incr to 129 observed maximum   Stairs              Wheelchair Mobility    Modified Rankin (Stroke Patients Only)       Balance Overall balance assessment: No apparent balance deficits (not formally assessed)                                          Cognition Arousal/Alertness: Awake/alert Behavior During Therapy: Flat affect Overall Cognitive Status: Impaired/Different from baseline Area of Impairment: Safety/judgement                             Problem Solving: Slow processing        Exercises      General Comments General comments (skin integrity, edema, etc.): HR range 118-129bpm with activity; DOE 2/4      Pertinent Vitals/Pain Pain Assessment: No/denies pain    Home Living                      Prior Function            PT Goals (current goals can now be found in the care plan section) Acute Rehab PT Goals Patient Stated Goal: reports would like to go outside PT Goal Formulation: With patient Time For Goal Achievement: 05/15/20 Potential to Achieve Goals: Good Progress towards PT goals: Progressing toward goals    Frequency    Min 1X/week      PT Plan Current plan remains appropriate    Co-evaluation              AM-PAC PT "  6 Clicks" Mobility   Outcome Measure  Help needed turning from your back to your side while in a flat bed without using bedrails?: None Help needed moving from lying on your back to sitting on the side of a flat bed without using bedrails?: None Help needed moving to and from a bed to a chair (including a wheelchair)?: None Help needed standing up from a chair using your arms (e.g., wheelchair or bedside chair)?: None Help needed to walk in hospital room?: None Help needed climbing 3-5 steps with a railing? : A Little 6 Click Score: 23    End of Session   Activity Tolerance: Patient tolerated treatment well Patient left: in bed;with call bell/phone within reach;with nursing/sitter in room Nurse Communication: Mobility  status PT Visit Diagnosis: Unsteadiness on feet (R26.81);Other abnormalities of gait and mobility (R26.89);Muscle weakness (generalized) (M62.81)     Time: 5102-5852 PT Time Calculation (min) (ACUTE ONLY): 21 min  Charges:  $Gait Training: 8-22 mins                     Van Clines, [place]  [facility] Services Pager [phone] Office [phone]    Levi Aland 05/10/2020, 1:39 PM

## 2020-05-10 NOTE — Progress Notes (Addendum)
[facility]  Date of Admission:  04/27/2020     Total days of antibiotics 15         ASSESSMENT:  Ms. Pauls remains afebrile and stable as she continues treatment with vancomycin for MRSA tricuspid valve endocarditis complicated with bacteremia and empyema. Renal function is without evidence of nephrotoxicity and most recent vancomycin trough improved to 22. Discussed the plan of care to continue current dose of vancomycin and need for continued prolonged therapy. Estimated end date of therapy planned for 7/13.   PLAN:  1. Continue vancomycin. 2. Therapeutic drug monitoring of renal function and vancomycin levels. 3. Opioid use/substance use disorder per primary team.  Active Problems:   Endocarditis   Spontaneous tension pneumothorax   AKI (acute kidney injury) (HCC)   Sepsis without acute organ dysfunction (HCC)   Pneumothorax   Status post chest tube placement (HCC)   Polysubstance abuse (HCC)   Anemia   Hypokalemia   Hypomagnesemia   Tachycardia   Positive hepatitis C antibody test    buprenorphine-naloxone  1 tablet Sublingual BID   carvedilol  3.125 mg Oral BID WC   dextromethorphan-guaiFENesin  1 tablet Oral BID   lidocaine  1 patch Transdermal Q24H   magnesium oxide  400 mg Oral BID   QUEtiapine  50 mg Oral BID    SUBJECTIVE:  Afebrile overnight with no acute events. Has questions about her CT scan and also how much longer she will need treatment.   No Known Allergies   Review of Systems: Review of Systems  Constitutional: Negative for chills, fever and weight loss.  Respiratory: Negative for cough, shortness of breath and wheezing.   Cardiovascular: Negative for chest pain and leg swelling.  Gastrointestinal: Negative for abdominal pain, constipation, diarrhea, nausea and vomiting.  Skin: Negative for rash.      OBJECTIVE: Vitals:   05/09/20 2124 05/10/20 0139 05/10/20 0529 05/10/20 0929  BP: 114/71 106/74  103/71 (!) 90/58  Pulse: (!) 116 (!) 122 (!) 119 (!) 115  Resp: 18 18 18 20   Temp: 98.1 F (36.7 C) 99.3 F (37.4 C) 98.9 F (37.2 C) 98.7 F (37.1 C)  TempSrc: Oral Oral Oral Oral  SpO2: 98% 97% 99% 98%  Weight:      Height:       Body mass index is 18.59 kg/m.  Physical Exam Constitutional:      General: She is not in acute distress.    Appearance: She is well-developed.     Comments: Lying in bed; pleasant; sitter at bedside.   Cardiovascular:     Rate and Rhythm: Regular rhythm. Tachycardia present.     Heart sounds: Normal heart sounds.  Pulmonary:     Effort: Pulmonary effort is normal.     Breath sounds: Normal breath sounds.  Skin:    General: Skin is warm and dry.  Neurological:     Mental Status: She is alert and oriented to person, place, and time.  Psychiatric:        Mood and Affect: Mood normal.        Behavior: Behavior normal.     Lab Results Lab Results  Component Value Date   WBC 10.6 (H) 05/10/2020   HGB 8.8 (L) 05/10/2020   HCT 29.6 (L) 05/10/2020   MCV 91.9 05/10/2020   PLT 256 05/10/2020    Lab Results  Component Value Date   CREATININE 0.54 05/10/2020   BUN 16 05/10/2020   NA 134 (  L) 05/10/2020   K 4.2 05/10/2020   CL 101 05/10/2020   CO2 25 05/10/2020    Lab Results  Component Value Date   ALT 79 (H) 05/01/2020   AST 63 (H) 05/01/2020   ALKPHOS 128 (H) 05/01/2020   BILITOT 0.5 05/01/2020     Microbiology: Recent Results (from the past 240 hour(s))  Culture, blood (Routine X 2) w Reflex to ID Panel     Status: None   Collection Time: 05/03/20  2:57 PM   Specimen: BLOOD RIGHT HAND  Result Value Ref Range Status   Specimen Description BLOOD RIGHT HAND  Final   Special Requests   Final    BOTTLES DRAWN AEROBIC ONLY Blood Culture adequate volume   Culture   Final    NO GROWTH 5 DAYS Performed at [facility], [address] [place], [place] [address]    Report Status 05/08/2020 FINAL  Final  Culture, blood (Routine  X 2) w Reflex to ID Panel     Status: None   Collection Time: 05/03/20  2:57 PM   Specimen: BLOOD  Result Value Ref Range Status   Specimen Description BLOOD RIGHT ANTECUBITAL  Final   Special Requests   Final    BOTTLES DRAWN AEROBIC ONLY Blood Culture adequate volume   Culture   Final    NO GROWTH 5 DAYS Performed at [facility], [place] [address], [place], [place] [address]    Report Status 05/08/2020 FINAL  Final     Terri Piedra, NP [facility]  05/10/2020  10:10 AM

## 2020-05-10 NOTE — Progress Notes (Addendum)
Pharmacy Antibiotic Note  Candice Jordan is a 19 y.o. female admitted on 04/27/2020 with MRSA bacteremia/TV endocarditis and septic emboli.   Scr stable at 0.54. Afebrile. WBC stable at 10.6. Vancomycin was reduced to 750 mg every 8 hours on 6/5. We will plan for a repeat trough tonight with a goal trough of 15-20 mcg/mL. Plan treatment through 7/13  Plan: Vancomycin to 750 mg IV every 8 hours.  Goal trough 15-20 mcg/mL. Plan repeat vanc trough tonight Will follow renal function and clinical progress   Height: 5\' 5"  (165.1 cm) Weight: 50.7 kg (111 lb 11.2 oz) IBW/kg (Calculated) : 57  Temp (24hrs), Avg:98.5 F (36.9 C), Min:97.9 F (36.6 C), Max:99.3 F (37.4 C)  Recent Labs  Lab 05/04/20 0609 05/06/20 0523 05/06/20 1343 05/07/20 1216 05/10/20 0323  WBC 12.6*  --   --   --  10.6*  CREATININE 0.60 0.62  --   --  0.54  VANCOTROUGH  --   --  29* 22*  --     Estimated Creatinine Clearance: 89.8 mL/min (by C-G formula based on SCr of 0.54 mg/dL).    No Known Allergies  Antimicrobials this admission: Vancomycin 5/26>> Zosyn 5/25>>5/26  Dose adjustments this admission: -5/28: VT 8 - change from 500 mg IV every 8 hours to 1000 mg IV every 8 hours -5/30: VT 16 - continue same regimen -6/4: VT 29 - collected during infusion, ordered another VT for 6/5 -6/5: VT 22 - Change from 1000 mg IV Q8H to 750 mg IV Q8H  Microbiology results: 5/25 Bcx (at OSH): GPC in clusters (2/2) -MRSA  5/26BCx: staph aureus 2/2; BCID w/ MRSA 1/4 5/26UCx: neg 5/26MRSA PCR: pos  5/28 Bcx: staph aurus 2/2 6/1 Bcx: NGTD  Thank you for allowing pharmacy to be a part of this patient's care.  8/1, PharmD, BCPS, BCIDP Infectious Diseases Clinical Pharmacist Phone: [phone] 05/10/2020 10:34 AM

## 2020-05-10 NOTE — Progress Notes (Signed)
PROGRESS NOTE  Candice Jordan IEP:[id number] DOB: 2001/03/23 DOA: 04/27/2020 PCP: System, Provider Not In  Brief History   19 year old female presents to [facility] ED on 5/25 with reported 1 week of shortness of breath. Patient was found at [address] with oxygen saturation 79%. Reports using Heroin last night. Diagnosed with MRSA Bacteremia/Endocarditis and COVID 03/08/2020 however has left AMA numerous times throughout the month of April. On arrival to ED. CXR revealing of large right sided tension Pneumothorax. Chest Tube placed. 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 She has been IVC'd. IVC was renewed on 05/05/2020.  The patient has had continual tachycardia since admission. Likely due to [place] secondary to chronic cocaine and heroin abuse. CTA chest has ruled out PE, but does demonstrate moderate bilateral pleural effusions.  I have discussed the pleural effusions with IR. It seems that they are small and the patient is unlikely to benefit from thoracentesis.  Consultants   PCCM  Infectious Disease  Psychiatry  Cardiothoracic surgery  Procedures   None  Antibiotics   Anti-infectives (From admission, onward)   Start     Dose/Rate Route Frequency Ordered Stop   05/07/20 1430  vancomycin (VANCOREADY) IVPB 750 mg/150 mL     750 mg 150 mL/hr over 60 Minutes Intravenous Every 8 hours 05/07/20 1313     05/01/20 1430  vancomycin (VANCOCIN) IVPB 1000 mg/200 mL premix  Status:  Discontinued     1,000 mg 200 mL/hr over 60 Minutes Intravenous Every 8 hours 05/01/20 0946 05/07/20 1313   04/30/20 0600  vancomycin (VANCOCIN) IVPB 1000 mg/200 mL premix  Status:  Discontinued     1,000 mg 200 mL/hr over 60 Minutes Intravenous Every 8 hours 04/29/20 2318 05/01/20 0947   04/29/20 2330  vancomycin  (VANCOREADY) IVPB 500 mg/100 mL  Status:  Discontinued     500 mg 100 mL/hr over 60 Minutes Intravenous Every 8 hours 04/29/20 1648 04/29/20 2315   04/27/20 1100  vancomycin (VANCOREADY) IVPB 500 mg/100 mL  Status:  Discontinued     500 mg 100 mL/hr over 60 Minutes Intravenous Every 8 hours 04/27/20 0210 04/29/20 1648   04/27/20 0300  vancomycin (VANCOCIN) IVPB 1000 mg/200 mL premix     1,000 mg 200 mL/hr over 60 Minutes Intravenous  Once 04/27/20 0203 04/27/20 0423   04/27/20 0300  piperacillin-tazobactam (ZOSYN) IVPB 3.375 g  Status:  Discontinued     3.375 g 12.5 mL/hr over 240 Minutes Intravenous Every 8 hours 04/27/20 0210 04/27/20 0856     Subjective  The patient is awake, alert, and oriented x 3. No acute distress.  Objective   Vitals:  Vitals:   05/10/20 0929 05/10/20 1327  BP: (!) 90/58 98/66  Pulse: (!) 115 (!) 117  Resp: 20 20  Temp: 98.7 F (37.1 C) 97.8 F (36.6 C)  SpO2: 98% 100%   Exam:  Constitutional:   The patient is awake, alert, and oriented x 3. She is not awakened. No acute distress. Respiratory:   No increased work of breathing.  No wheezes, rales, or rhonchi  No tactile fremitus Cardiovascular:   Regular rate and rhythm  No murmurs, ectopy, or gallups.  No lateral PMI. No thrills. Abdomen:   Abdomen is soft, non-tender, non-distended  No hernias, masses, or organomegaly  Normoactive bowel sounds.  Musculoskeletal:   No cyanosis, clubbing, or edema Skin:   No rashes, lesions, ulcers  palpation of skin: no induration or nodules Neurologic:   CN 2-12 intact  Sensation all 4 extremities intact Psychiatric:   Mental status o Mood, affect appropriate o Orientation to person, place, time   judgment and insight appear intact   I have personally reviewed the following:   Today's Data   Vitals  Micro Data   Blood Cultures x 2 (04/29/2020): Positive for Staph Aureus  Blood Cultures x 2 (05/03/2020): No growth  Imaging    CXR Diffuse right and patchy left consolidate consistent with pneumonia (04/28/2020)  Scheduled Meds:  buprenorphine-naloxone  1 tablet Sublingual BID   carvedilol  3.125 mg Oral BID WC   dextromethorphan-guaiFENesin  1 tablet Oral BID   lidocaine  1 patch Transdermal Q24H   magnesium oxide  400 mg Oral BID   QUEtiapine  50 mg Oral BID   Continuous Infusions:  sodium chloride Stopped (05/02/20 2205)   vancomycin 750 mg (05/10/20 0807)    Active Problems:   Endocarditis   Spontaneous tension pneumothorax   AKI (acute kidney injury) ([place])   Sepsis without acute organ dysfunction (HCC)   Pneumothorax   Status post chest tube placement (HCC)   Polysubstance abuse (HCC)   Anemia   Hypokalemia   Hypomagnesemia   Tachycardia   Positive hepatitis C antibody test   LOS: 13 days    A & P  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  Patient met criteria for sepsis on admission with tachypnea, tachycardia, leukocytosis, concern for untreated bacteremia.  Repeat 2D echo which was done showed worsening/severe tricuspid valvular regurgitation, tricuspid valve vegetation attached to posterior leaflet measuring 0.9 cm x 0.5 cm more apparent on this study the last study.  Severely dilated right atrial size.  Hyperdynamic right ventricular systolic function.  Continue empiric IV vancomycin.  CT surgery was consulted 04/28/2020 for possible valve repair however due to ongoing IV drug abuse patient at high risk for reinfection if she were to undergo valve replacement and recommending focusing on drug rehabilitation.  Per CT surgery obtaining a TEE will not change surgical candidacy and recommending medical management for now.  Repeat blood cultures from 04/29/2028 + for staph aureus.  Repeat blood cultures drawn on 05/03/2020.  Will monitor.  Continue IV vancomycin.  ID following.  Acute hypoxemic respiratory distress secondary to right-sided tension pneumothorax status post chest tube placement: Chest tube now removed. 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  Chest tube removed 04/28/2020 per PCCM.  Repeat chest x-ray done showed no recurrent pneumothorax.  Pulmonary hygiene.  Mucinex added due to ongoing cough.  Place on Hycodan as needed. Supportive care.   Bilateral moderate pleural effusions. CTA chest has demonstrated bilateral moderate pleural effusions. Will monitor and consider thoracentesis, however, the patient's history of tension pneumothorax is concerning for risk of recurrence.  I have discussed the patient with Dr. Larena Glassman. He states that in his estimation the pleural effusions are small and do not warrant intervention at this time.  Tachycardia: Likely due to chronic IV abuse of heroin and cocaine. CTA chest negative for PE.   Polysubstance abuse/heroin use: UDS noted at outside hospital to be positive for amphetamines. Patient noted  to have multiple admissions for endocarditis, where she has left AMA . Patient seen by psychiatry on 04/27/2020 and is felt patient lacks capacity to make an informed medical decision regarding her medical care and disposition and recommending social work evaluation to contact next of kin no appropriate hospital administrator to help with decision-making process. Continue Suboxone.  Was on clonidine taper/detox protocol. Patient wanted to leave AMA again (04/29/2020). Patient however did state she understands the risk of leaving AMA which include death. Was also felt by PCCM that patient lacked capacity poor insight into her current medical situation. Patient with poor insight into her current medical situation.  Patient not thinking rationally. Patient has subsequently been IVC'd to finish out  treatment for MRSA disseminated infection/endocarditis with worsening tricuspid regurgitation. Patient deemed not a surgical candidate due to ongoing IV drug use. Patient has been reassessed by psych who are in agreement that patient lacks capacity.    Anemia of chronic disease: Anemia panel consistent with anemia of chronic disease.  Status post transfusion 1 unit packed red blood cells.  Hemoglobin currently at 8.2 from 7.4 from 7.2 from 7.9 from 7.0.  DIC panel negative for schistocytes. Transfusion threshold hemoglobin <7.  Tachycardia: Likely secondary to problems # 1,2,3,4.  Continue treatment with IV antibiotics.  Patient s/p transfusion 1 unit of packed red blood cells.  Lopressor as needed for heart rate > 130.  Will place on Coreg 3.125 mg p.o. twice daily. Persistent regardless of volume status. Likely due to chronic IV cocaine and heroin abuse. Will check CTA chest to rule out PE as the patient has been fairly immobile as inpatient and therefore is at increased risk for PE.  Hypomagnesemia/hypokalemia: Magnesium at 1. this morning. We will give magnesium sulfate 4 g IV x1 again. Continue oral supplementation. Potassium of 4.0. Follow.  Positive hep C antibody/transaminitis: HCV RNA quant of 2260.  Outpatient follow-up with ID.   I have seen and examined this patient myself. I have spent 30 minutes in her evaluation and care.  DVT prophylaxis: SCDs Code Status: Full Family Communication: No family at bedside. Disposition:   Status is: Inpatient  Dispo: The patient is from: Home  Anticipated d/c is to: To be determined  Anticipated d/c date is: Once IV antibiotics have been completed.  Needs antibiotics for 6 weeks.  When cleared by ID.  Patient currently on IV antibiotics, with tricuspid endocarditis, being followed by ID.  Patient has now been IVC'D.  Patient currently not safe to be discharged home as patient has left multiple times AMA  and now has worsening tricuspid regurg with tricuspid endocarditis with interrupted treatments.  Patient at high risk for decompensation.Karie Kirks, DO [facility] Direct contact: see [email or web address]  7PM-7AM contact night coverage as above 05/10/2020, 1:37 PM  LOS: 7 days

## 2020-05-11 LAB — MAGNESIUM: Magnesium: 1.7 mg/dL (ref 1.7–2.4)

## 2020-05-11 MED ORDER — OXYCODONE HCL 5 MG PO TABS
5.0000 mg | ORAL_TABLET | ORAL | Status: DC | PRN
  Administered 2020-05-11 – 2020-05-12 (×3): 5 mg via ORAL
  Filled 2020-05-11 (×4): qty 1

## 2020-05-11 NOTE — Progress Notes (Signed)
PROGRESS NOTE    Candice Jordan  TXM:[id number] DOB: Sep 19, 2001 DOA: 04/27/2020 PCP: System, Provider Not In   Brief Narrative:  19 year old female presents [facility] ED on 5/25 with reported 1 week of shortness of breath. Patient was found at [address] with oxygen saturation 79%. Reports using Heroin last night. Diagnosed with MRSA Bacteremia/Endocarditis and COVID 03/08/2020 however has left AMA numerous times throughout the month of April. On arrival to ED. CXR revealing of large right sided tension Pneumothorax. Chest Tube placed. 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 She has been IVC'd. IVC was renewed on 05/05/2020.  The patient has had continual tachycardia since admission. Likely due to [place] secondary to chronic cocaine and heroin abuse. CTA chest has ruled out PE, but does demonstrate moderate bilateral pleural effusions.  I have discussed the pleural effusions with IR. It seems that they are small and the patient is unlikely to benefit from thoracentesis.   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 Patient met criteria for sepsis on admission with tachypnea, tachycardia, leukocytosis, concern for untreated bacteremia. Repeat 2D echo which was done showed  worsening/severe tricuspid valvular regurgitation, tricuspid valve vegetation attached to posterior leaflet measuring 0.9 cm x 0.5 cm more apparent on this study the last study. Severely dilated right atrial size. Hyperdynamic right ventricular systolic function.CT surgery was consulted 04/28/2020 for possible valve repair however due to ongoing IV drug abuse patient at high risk for reinfection if she were to undergo valve replacement and recommending focusing on drug rehabilitation. Per CT surgery obtaining a TEE will not change surgical candidacy and recommending medical management for now. Repeat blood cultures from 04/29/2028 + for staph aureus. Repeat blood cultures drawn on 05/03/2020 which were negative.  ID following and patient remains on IV vancomycin with estimated end date of therapy planned for 06/14/2020 per ID.  Acute hypoxemic respiratory distress secondary to right-sided tensionpneumothorax status post chest tube placement: Chest tube now removed. 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 Chest tube removed 04/28/2020 per PCCM. Repeat chest x-ray done showed no recurrent pneumothorax.  Recent CT angiogram of the chest done on 05/07/2020 shows significant improvement in right pneumothorax. Pulmonary hygiene. Mucinex added due to ongoing cough. Place on Hycodan as needed. Supportive care.   Bilateral moderate pleural effusions. CTA chest has demonstrated bilateral moderate pleural effusions. Will monitor and consider thoracentesis, however, the patient's history of tension pneumothorax is concerning for risk of recurrence. previous hospitalist have discussed the patient with Dr. Larena Glassman. He states that in his estimation the pleural effusions are small and do not warrant intervention at this time.  Patient denied any chest pain or shortness of breath.  I will repeat  her chest x-ray in next 1 to 2  days.  Tachycardia: Likely due to chronic IV abuse of heroin and cocaine. CTA chest negative for PE.   Polysubstance abuse/heroin use: UDS noted at outside hospital to be positive for amphetamines. Patient noted to have multiple admissions for endocarditis, where she has left AMA . Patient seen by psychiatry on 04/27/2020 and is felt patient lacks capacity to make an informed medical decision regarding her medical care and disposition and recommending social work evaluation to contact next of kin no appropriate hospital administrator to help with decision-making process. Continue Suboxone. Was on clonidine taper/detox protocol. Patient wanted to leave AMA again (04/29/2020). Patient however did state she understands the risk of leaving AMA which include death. Was also felt by PCCM that patient lacked capacity poor insight into her current medical situation.Patient with poor insight into her current medical situation. Patient not thinking rationally. Patient has subsequently been IVC'd to finish out treatment for MRSA disseminated infection/endocarditis with worsening tricuspid regurgitation.Patient has been reassessed by psych who are in agreement that patient lacks capacity.   Anemiaof chronic disease: Anemia panel consistent with anemia of chronic disease. Status post transfusion 1 unit packed red blood cells. Hemoglobin 8.8 yesterday.  Hypomagnesemia/hypokalemia: Resolved.  Positive hep C antibody/transaminitis: HCV RNA quant of 2260. Outpatient follow-up with ID.   DVT prophylaxis: SCD   Code Status: Full Code  Family Communication:  None present at bedside.  Plan of care discussed with patient in length and he verbalized understanding and agreed with it. Patient is from: Home Disposition Plan: Home Barriers to discharge: Need for continuation of IV antibiotics to treat endocarditis  Status is: Inpatient  Remains inpatient appropriate because:Inpatient level of care appropriate  due to severity of illness   Dispo: The patient is from: Home              Anticipated d/c is to: Home              Anticipated d/c date is: > 3 days              Patient currently is not medically stable to d/c.        Estimated body mass index is 18.59 kg/m as calculated from the following:   Height as of this encounter: 5' 5"  (1.651 m).   Weight as of this encounter: 50.7 kg.      Nutritional status:               Consultants:   ID, cardiothoracic surgery  Procedures:   None  Antimicrobials:  Anti-infectives (From admission, onward)   Start     Dose/Rate Route Frequency Ordered Stop   05/07/20 1430  vancomycin (VANCOREADY) IVPB 750 mg/150 mL     750 mg 150 mL/hr over 60 Minutes Intravenous Every 8 hours 05/07/20 1313     05/01/20 1430  vancomycin (VANCOCIN) IVPB 1000 mg/200 mL premix  Status:  Discontinued     1,000 mg 200 mL/hr over 60 Minutes Intravenous Every 8 hours 05/01/20 0946 05/07/20 1313   04/30/20 0600  vancomycin (VANCOCIN) IVPB 1000 mg/200 mL premix  Status:  Discontinued     1,000 mg 200 mL/hr over 60 Minutes Intravenous Every 8 hours 04/29/20 2318 05/01/20 0947   04/29/20 2330  vancomycin (VANCOREADY) IVPB 500 mg/100 mL  Status:  Discontinued     500 mg 100 mL/hr over 60 Minutes Intravenous Every 8 hours 04/29/20 1648 04/29/20 2315   04/27/20 1100  vancomycin (VANCOREADY) IVPB  500 mg/100 mL  Status:  Discontinued     500 mg 100 mL/hr over 60 Minutes Intravenous Every 8 hours 04/27/20 0210 04/29/20 1648   04/27/20 0300  vancomycin (VANCOCIN) IVPB 1000 mg/200 mL premix     1,000 mg 200 mL/hr over 60 Minutes Intravenous  Once 04/27/20 0203 04/27/20 0423   04/27/20 0300  piperacillin-tazobactam (ZOSYN) IVPB 3.375 g  Status:  Discontinued     3.375 g 12.5 mL/hr over 240 Minutes Intravenous Every 8 hours 04/27/20 0210 04/27/20 0856         Subjective: Seen and examined.  Complains of low back pain.  Received Tylenol 15 minutes before  I saw her.  No other complaint.  Objective: Vitals:   05/10/20 2142 05/11/20 0139 05/11/20 0539 05/11/20 0942  BP: 95/60 110/60 97/67 101/66  Pulse: (!) 108 (!) 110 (!) 115 (!) 117  Resp: 20 18 20 19   Temp: 98.1 F (36.7 C) 99 F (37.2 C) 98.3 F (36.8 C) 98.1 F (36.7 C)  TempSrc:  Oral Oral Oral  SpO2: 100% 100% 96% 95%  Weight:      Height:        Intake/Output Summary (Last 24 hours) at 05/11/2020 1318 Last data filed at 05/11/2020 1106 Gross per 24 hour  Intake 730 ml  Output 1 ml  Net 729 ml   Filed Weights   05/07/20 0346 05/08/20 0625 05/09/20 1801  Weight: 49.9 kg 50.4 kg 50.7 kg    Examination:  General exam: Appears calm and comfortable  Respiratory system: Diminished breath sounds at the bases with poor inspiratory effort.. Cardiovascular system: S1 & S2 heard, RRR. No JVD, murmurs, rubs, gallops or clicks. No pedal edema. Gastrointestinal system: Abdomen is nondistended, soft and nontender. No organomegaly or masses felt. Normal bowel sounds heard. Central nervous system: Alert and oriented. No focal neurological deficits. Extremities: Symmetric 5 x 5 power. Skin: No rashes, lesions or ulcers Psychiatry: Judgement and insight appear poor, mood & affect flat.   Data Reviewed: I have personally reviewed following labs and imaging studies  CBC: Recent Labs  Lab 05/10/20 0323  WBC 10.6*  HGB 8.8*  HCT 29.6*  MCV 91.9  PLT 008   Basic Metabolic Panel: Recent Labs  Lab 05/06/20 0523 05/10/20 0323 05/11/20 0907  NA 133* 134*  --   K 4.0 4.2  --   CL 100 101  --   CO2 24 25  --   GLUCOSE 108* 100*  --   BUN 13 16  --   CREATININE 0.62 0.54  --   CALCIUM 8.2* 8.6*  --   MG 1.6*  --  1.7   GFR: Estimated Creatinine Clearance: 89.8 mL/min (by C-G formula based on SCr of 0.54 mg/dL). Liver Function Tests: No results for input(s): AST, ALT, ALKPHOS, BILITOT, PROT, ALBUMIN in the last 168 hours. No results for input(s): LIPASE, AMYLASE in the last  168 hours. No results for input(s): AMMONIA in the last 168 hours. Coagulation Profile: No results for input(s): INR, PROTIME in the last 168 hours. Cardiac Enzymes: No results for input(s): CKTOTAL, CKMB, CKMBINDEX, TROPONINI in the last 168 hours. BNP (last 3 results) No results for input(s): PROBNP in the last 8760 hours. HbA1C: No results for input(s): HGBA1C in the last 72 hours. CBG: No results for input(s): GLUCAP in the last 168 hours. Lipid Profile: No results for input(s): CHOL, HDL, LDLCALC, TRIG, CHOLHDL, LDLDIRECT in the last 72 hours. Thyroid Function Tests: No results for input(s):  TSH, T4TOTAL, FREET4, T3FREE, THYROIDAB in the last 72 hours. Anemia Panel: No results for input(s): VITAMINB12, FOLATE, FERRITIN, TIBC, IRON, RETICCTPCT in the last 72 hours. Sepsis Labs: No results for input(s): PROCALCITON, LATICACIDVEN in the last 168 hours.  Recent Results (from the past 240 hour(s))  Culture, blood (Routine X 2) w Reflex to ID Panel     Status: None   Collection Time: 05/03/20  2:57 PM   Specimen: BLOOD RIGHT HAND  Result Value Ref Range Status   Specimen Description BLOOD RIGHT HAND  Final   Special Requests   Final    BOTTLES DRAWN AEROBIC ONLY Blood Culture adequate volume   Culture   Final    NO GROWTH 5 DAYS Performed at [facility], [address] [place], [place] [address]    Report Status 05/08/2020 FINAL  Final  Culture, blood (Routine X 2) w Reflex to ID Panel     Status: None   Collection Time: 05/03/20  2:57 PM   Specimen: BLOOD  Result Value Ref Range Status   Specimen Description BLOOD RIGHT ANTECUBITAL  Final   Special Requests   Final    BOTTLES DRAWN AEROBIC ONLY Blood Culture adequate volume   Culture   Final    NO GROWTH 5 DAYS Performed at [facility], [place] [address], [place], [place] [address]    Report Status 05/08/2020 FINAL  Final      Radiology Studies: No results found.  Scheduled Meds: .  buprenorphine-naloxone  1 tablet Sublingual BID  . carvedilol  3.125 mg Oral BID WC  . dextromethorphan-guaiFENesin  1 tablet Oral BID  . lidocaine  1 patch Transdermal Q24H  . magnesium oxide  400 mg Oral BID  . QUEtiapine  50 mg Oral BID   Continuous Infusions: . sodium chloride Stopped (05/02/20 2205)  . vancomycin 750 mg (05/11/20 1106)     LOS: 14 days   Time spent: 47 min   Darliss Cheney, MD [facility]  05/11/2020, 1:18 PM   To contact the attending provider between 7A-7P or the covering provider during after hours 7P-7A, please log into the web site [email or web address].

## 2020-05-11 NOTE — Clinical Social Work Note (Addendum)
Patient continues to be IVC'd . Ms. Glendening's IVC paperwork updated, faxed to the magistrates office and pending service by police officers.   IVC paperwork was served by police officers and placed in patient's chart.  IVC paperwork will need to be updated again on 05/17/20.    Genelle Bal, MSW, LCSW Licensed Clinical Social Worker Clinical Social Work Department [employer] [phone]

## 2020-05-11 NOTE — Plan of Care (Signed)
  Problem: Safety: Goal: Ability to remain free from injury will improve Outcome: Progressing   

## 2020-05-12 LAB — CBC WITH DIFFERENTIAL/PLATELET
Abs Immature Granulocytes: 0.04 10*3/uL (ref 0.00–0.07)
Basophils Absolute: 0.1 10*3/uL (ref 0.0–0.1)
Basophils Relative: 1 %
Eosinophils Absolute: 0.2 10*3/uL (ref 0.0–0.5)
Eosinophils Relative: 2 %
HCT: 32.4 % — ABNORMAL LOW (ref 36.0–46.0)
Hemoglobin: 9.5 g/dL — ABNORMAL LOW (ref 12.0–15.0)
Immature Granulocytes: 0 %
Lymphocytes Relative: 44 %
Lymphs Abs: 4.2 10*3/uL — ABNORMAL HIGH (ref 0.7–4.0)
MCH: 27.2 pg (ref 26.0–34.0)
MCHC: 29.3 g/dL — ABNORMAL LOW (ref 30.0–36.0)
MCV: 92.8 fL (ref 80.0–100.0)
Monocytes Absolute: 0.8 10*3/uL (ref 0.1–1.0)
Monocytes Relative: 9 %
Neutro Abs: 4.3 10*3/uL (ref 1.7–7.7)
Neutrophils Relative %: 44 %
Platelets: 289 10*3/uL (ref 150–400)
RBC: 3.49 MIL/uL — ABNORMAL LOW (ref 3.87–5.11)
RDW: 18.6 % — ABNORMAL HIGH (ref 11.5–15.5)
WBC: 9.6 10*3/uL (ref 4.0–10.5)
nRBC: 0 % (ref 0.0–0.2)

## 2020-05-12 LAB — BASIC METABOLIC PANEL
Anion gap: 9 (ref 5–15)
BUN: 17 mg/dL (ref 6–20)
CO2: 24 mmol/L (ref 22–32)
Calcium: 9 mg/dL (ref 8.9–10.3)
Chloride: 102 mmol/L (ref 98–111)
Creatinine, Ser: 0.57 mg/dL (ref 0.44–1.00)
GFR calc Af Amer: >60 mL/min (ref >60)
GFR calc non Af Amer: >60 mL/min (ref >60)
Glucose, Bld: 93 mg/dL (ref 70–99)
Potassium: 4.3 mmol/L (ref 3.5–5.1)
Sodium: 135 mmol/L (ref 135–145)

## 2020-05-12 LAB — MAGNESIUM: Magnesium: 1.6 mg/dL — ABNORMAL LOW (ref 1.7–2.4)

## 2020-05-12 MED ORDER — HYDROCORTISONE 1 % EX CREA
TOPICAL_CREAM | Freq: Two times a day (BID) | CUTANEOUS | Status: DC
  Filled 2020-05-12: qty 28

## 2020-05-12 NOTE — Plan of Care (Signed)
  Problem: Activity: Goal: Risk for activity intolerance will decrease Outcome: Progressing   Problem: Safety: Goal: Ability to remain free from injury will improve Outcome: Progressing   

## 2020-05-12 NOTE — Progress Notes (Signed)
Occupational Therapy Treatment Patient Details Name: Candice Jordan MRN: [id number] DOB: September 29, 2001 Today's Date: 05/12/2020    History of present illness Patient is a 18 year old female presenting to [facility] ED on 5/25 with reported 1 week of shortness of breath. On arrival to ED CXR revealing of large right sided tension Pneumothorax. Chest Tube placed. 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 No longer requiring chest tube. History of MRSA Bacteremia/Endocarditis. COVID-19. Polysubstance Abuse.    OT comments  Patient continues to make steady progress towards goals in skilled OT session. Patient's session encompassed speaking about various strategies in order to boost overall affect and demeanor. Pt continues to perseverate on going outside, however with coaxing was able to come up with 2 separate strategies to complete in the room (make a Jordan of makeup for grandmother to bring, various magazines to read). Pt with decreased eye contact throughout session, often looking at TV over therapist's head, but was able to redirect easily with encouragement from boyfriend. While not medically stable, therapist would like to advocate for pt to be allowed to go outside supervised in hopes for increased participation in therapy and overall volition. Discharge remains appropriate at this time; will continue to follow acutely.    Follow Up Recommendations  No OT follow up;Supervision - Intermittent (inpatient drug/psych rehab)    Equipment Recommendations  None recommended by OT    Recommendations for Other Services      Precautions / Restrictions Precautions Precautions: Other (comment) Precaution Comments: sitter present due to IVC; watch HR with activity Restrictions Weight Bearing Restrictions: No       Mobility Bed Mobility                  Transfers                      Balance                                            ADL either performed or assessed with clinical judgement   ADL Overall ADL's : Needs assistance/impaired                                       General ADL Comments: Session focus on increased life skills, problem solving, IADLs     Vision       Perception     Praxis      Cognition Arousal/Alertness: Awake/alert Behavior During Therapy: Flat affect Overall Cognitive Status: Impaired/Different from baseline Area of Impairment: Safety/judgement                   Current Attention Level: Alternating     Safety/Judgement: Decreased awareness of safety;Decreased awareness of deficits Awareness: Intellectual   General Comments: Pt with increased agitiation in session often stating "I dont know how you can help me" but was willing to talk through strategies with therapist to boost overall affect        Exercises     Shoulder Instructions       General Comments      Pertinent Vitals/ Pain       Pain Assessment: No/denies pain  Home Living  Prior Functioning/Environment              Frequency  Min 2X/week        Progress Toward Goals  OT Goals(current goals can now be found in the care plan section)  Progress towards OT goals: Progressing toward goals  Acute Rehab OT Goals Patient Stated Goal: reports would like to go outside OT Goal Formulation: With patient Time For Goal Achievement: 05/16/20 Potential to Achieve Goals: Good  Plan Discharge plan remains appropriate    Co-evaluation                 AM-PAC OT "6 Clicks" Daily Activity     Outcome Measure   Help from another person eating meals?: None Help from another person taking care of personal grooming?: A Little Help from another person toileting, which includes using toliet, bedpan, or urinal?: A Little Help from another person bathing (including washing, rinsing,  drying)?: A Little Help from another person to put on and taking off regular upper body clothing?: A Little Help from another person to put on and taking off regular lower body clothing?: A Little 6 Click Score: 19    End of Session    OT Visit Diagnosis: Unsteadiness on feet (R26.81);Other symptoms and signs involving cognitive function   Activity Tolerance Patient tolerated treatment well   Patient Left in bed;with call bell/phone within reach;with nursing/sitter in room   Nurse Communication Mobility status        Time: 3338-3291 OT Time Calculation (min): 9 min  Charges: OT General Charges $OT Visit: 1 Visit OT Treatments $Self Care/Home Management : 8-22 mins  Corinne Ports E. Anayah Arvanitis, COTA/L Acute Rehabilitation Services [place] 05/12/2020, 2:28 PM

## 2020-05-12 NOTE — Progress Notes (Signed)
PROGRESS NOTE    Candice Jordan  ZOX:[id number] DOB: [birth year] DOA: 04/27/2020 PCP: System, Provider Not In   Brief Narrative:  19 year old female presents to [facility] ED on 5/25 with reported 1 week of shortness of breath. Patient was found at [address] with oxygen saturation 79%. Reports using Heroin last night. Diagnosed with MRSA Bacteremia/Endocarditis and COVID 03/08/2020 however has left AMA numerous times throughout the month of April. On arrival to ED. CXR revealing of large right sided tension Pneumothorax. Chest Tube placed. 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 She has been IVC'd. IVC was renewed on 05/05/2020.  The patient has had continual tachycardia since admission. Likely due to [place] secondary to chronic cocaine and heroin abuse. CTA chest has ruled out PE, but does demonstrate moderate bilateral pleural effusions.  I have discussed the pleural effusions with IR. It seems that they are small and the patient is unlikely to benefit from thoracentesis.   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 Patient met criteria for sepsis on admission with tachypnea, tachycardia, leukocytosis, concern for untreated bacteremia. Repeat 2D echo which was done showed  worsening/severe tricuspid valvular regurgitation, tricuspid valve vegetation attached to posterior leaflet measuring 0.9 cm x 0.5 cm more apparent on this study the last study. Severely dilated right atrial size. Hyperdynamic right ventricular systolic function.CT surgery was consulted 04/28/2020 for possible valve repair however due to ongoing IV drug abuse patient at high risk for reinfection if she were to undergo valve replacement and recommending focusing on drug rehabilitation. Per CT surgery obtaining a TEE will not change surgical candidacy and recommending medical management for now. Repeat blood cultures from 04/29/2028 + for staph aureus. Repeat blood cultures drawn on 05/03/2020 which were negative.  ID following and patient remains on IV vancomycin with estimated end date of therapy planned for 06/14/2020 per ID.  Acute hypoxemic respiratory distress secondary to right-sided tensionpneumothorax status post chest tube placement: Chest tube now removed. 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 Chest tube removed 04/28/2020 per PCCM. Repeat chest x-ray done showed no recurrent pneumothorax.  Recent CT angiogram of the chest done on 05/07/2020 shows significant improvement in right pneumothorax. Pulmonary hygiene. Mucinex added due to ongoing cough. Place on Hycodan as needed. Supportive care.   Bilateral moderate pleural effusions. CTA chest has demonstrated bilateral moderate pleural effusions. Will monitor and consider thoracentesis, however, the patient's history of tension pneumothorax is concerning for risk of recurrence. previous hospitalist have discussed the patient with Dr. Larena Glassman. He states that in his estimation the pleural effusions are small and do not warrant intervention at this time.  Patient denied any chest pain or shortness of breath.  I will repeat  her chest x-ray in next 1 to 2  days.  Tachycardia: Likely due to chronic IV abuse of heroin and cocaine. CTA chest negative for PE.   Polysubstance abuse/heroin use: UDS noted at outside hospital to be positive for amphetamines. Patient noted to have multiple admissions for endocarditis, where she has left AMA . Patient seen by psychiatry on 04/27/2020 and is felt patient lacks capacity to make an informed medical decision regarding her medical care and disposition and recommending social work evaluation to contact next of kin no appropriate hospital administrator to help with decision-making process. Continue Suboxone. Was on clonidine taper/detox protocol. Patient wanted to leave AMA again (04/29/2020). Patient however did state she understands the risk of leaving AMA which include death. Was also felt by PCCM that patient lacked capacity poor insight into her current medical situation.Patient with poor insight into her current medical situation. Patient not thinking rationally. Patient has subsequently been IVC'd to finish out treatment for MRSA disseminated infection/endocarditis with worsening tricuspid regurgitation.Patient has been reassessed by psych who are in agreement that patient lacks capacity.   Anemiaof chronic disease: Anemia panel consistent with anemia of chronic disease. Status post transfusion 1 unit packed red blood cells. Hemoglobin 8.8 yesterday.  Hypomagnesemia/hypokalemia: Resolved.  Positive hep C antibody/transaminitis: HCV RNA quant of 2260. Outpatient follow-up with ID.   Back pain: Patient complains of back pain since yesterday.  This gets better with pain medications.  She does not allow me to examine her back.  Allergic rash/contact dermatitis: Patient had complained of some rash and itching at her lower back yesterday.  I examined her using a female chaperone/patient's primary nurse.  She had some maculopapular rash but no open sores or any drainage.  This was likely contact dermatitis.   Will order hydrocortisone.  DVT prophylaxis: SCD   Code Status: Full Code  Family Communication:  None present at bedside.  Plan of care discussed with patient in length and he verbalized understanding and agreed with it. Patient is from: Home Disposition Plan: Home Barriers to discharge: Need for continuation of IV antibiotics to treat endocarditis  Status is: Inpatient  Remains inpatient appropriate because:Inpatient level of care appropriate due to severity of illness   Dispo: The patient is from: Home              Anticipated d/c is to: Home              Anticipated d/c date is: > 3 days              Patient currently is not medically stable to d/c.        Estimated body mass index is 18.59 kg/m as calculated from the following:   Height as of this encounter: 5' 5"  (1.651 m).   Weight as of this encounter: 50.7 kg.      Nutritional status:               Consultants:   ID, cardiothoracic surgery  Procedures:   None  Antimicrobials:  Anti-infectives (From admission, onward)   Start     Dose/Rate Route Frequency Ordered Stop   05/07/20 1430  vancomycin (VANCOREADY) IVPB 750 mg/150 mL     Discontinue     750 mg 150 mL/hr over 60 Minutes Intravenous Every 8 hours 05/07/20 1313     05/01/20 1430  vancomycin (VANCOCIN) IVPB 1000 mg/200 mL premix  Status:  Discontinued        1,000 mg 200 mL/hr over 60 Minutes  Intravenous Every 8 hours 05/01/20 0946 05/07/20 1313   04/30/20 0600  vancomycin (VANCOCIN) IVPB 1000 mg/200 mL premix  Status:  Discontinued        1,000 mg 200 mL/hr over 60 Minutes Intravenous Every 8 hours 04/29/20 2318 05/01/20 0947   04/29/20 2330  vancomycin (VANCOREADY) IVPB 500 mg/100 mL  Status:  Discontinued        500 mg 100 mL/hr over 60 Minutes Intravenous Every 8 hours 04/29/20 1648 04/29/20 2315   04/27/20 1100  vancomycin (VANCOREADY) IVPB 500 mg/100 mL  Status:  Discontinued        500 mg 100 mL/hr over 60 Minutes Intravenous  Every 8 hours 04/27/20 0210 04/29/20 1648   04/27/20 0300  vancomycin (VANCOCIN) IVPB 1000 mg/200 mL premix        1,000 mg 200 mL/hr over 60 Minutes Intravenous  Once 04/27/20 0203 04/27/20 0423   04/27/20 0300  piperacillin-tazobactam (ZOSYN) IVPB 3.375 g  Status:  Discontinued        3.375 g 12.5 mL/hr over 240 Minutes Intravenous Every 8 hours 04/27/20 0210 04/27/20 0856         Subjective: Seen and examined.  She is laying on the left side.  Sitter at the bedside.  She complained of mild low back pain which is improving with pain medications.  No other complaint  She did not allow me to examine her back today despite of female chaperone being present in the room.  Objective: Vitals:   05/11/20 2013 05/11/20 2354 05/12/20 0411 05/12/20 0820  BP: 108/61 95/61 106/65 107/73  Pulse: (!) 114 (!) 119 (!) 116 (!) 113  Resp: 18 18 18 18   Temp: 97.9 F (36.6 C) 98.2 F (36.8 C) 98.9 F (37.2 C) 98.6 F (37 C)  TempSrc:  Oral  Oral  SpO2: 99% 97% 98% 98%  Weight:      Height:        Intake/Output Summary (Last 24 hours) at 05/12/2020 1232 Last data filed at 05/12/2020 0500 Gross per 24 hour  Intake 870 ml  Output 0 ml  Net 870 ml   Filed Weights   05/07/20 0346 05/08/20 0625 05/09/20 1801  Weight: 49.9 kg 50.4 kg 50.7 kg    Examination:  General exam: Appears calm and comfortable but lethargic Respiratory system: Clear to auscultation. Respiratory effort normal. Cardiovascular system: S1 & S2 heard, RRR. No JVD, murmurs, rubs, gallops or clicks. No pedal edema. Gastrointestinal system: Abdomen is nondistended, soft and nontender. No organomegaly or masses felt. Normal bowel sounds heard. Central nervous system: Lethargic but oriented. No focal neurological deficits. Extremities: Symmetric 5 x 5 power. Skin: No rashes, lesions or ulcers.  Psychiatry: Judgement and insight appear poor, mood & affect flat.   Data Reviewed: I have personally reviewed following labs and  imaging studies  CBC: Recent Labs  Lab 05/10/20 0323 05/12/20 0623  WBC 10.6* 9.6  NEUTROABS  --  4.3  HGB 8.8* 9.5*  HCT 29.6* 32.4*  MCV 91.9 92.8  PLT 256 188   Basic Metabolic Panel: Recent Labs  Lab 05/06/20 0523 05/10/20 0323 05/11/20 0907 05/12/20 0623  NA 133* 134*  --  135  K 4.0 4.2  --  4.3  CL 100 101  --  102  CO2 24 25  --  24  GLUCOSE 108* 100*  --  93  BUN 13 16  --  17  CREATININE 0.62 0.54  --  0.57  CALCIUM 8.2* 8.6*  --  9.0  MG 1.6*  --  1.7 1.6*   GFR: Estimated Creatinine Clearance: 89.8 mL/min (by C-G formula based on SCr of 0.57 mg/dL). Liver Function Tests: No results for input(s): AST, ALT, ALKPHOS, BILITOT, PROT, ALBUMIN in the last 168 hours. No results for input(s): LIPASE, AMYLASE in the last 168 hours. No results for input(s): AMMONIA in the last 168 hours. Coagulation Profile: No results for input(s): INR, PROTIME in the last 168 hours. Cardiac Enzymes: No results for input(s): CKTOTAL, CKMB, CKMBINDEX, TROPONINI in the last 168 hours. BNP (last 3 results) No results for input(s): PROBNP in the last 8760 hours. HbA1C: No results for input(s): HGBA1C in the last 72 hours. CBG: No results for input(s): GLUCAP in the last 168 hours. Lipid Profile: No results for input(s): CHOL, HDL, LDLCALC, TRIG, CHOLHDL, LDLDIRECT in the last 72 hours. Thyroid Function Tests: No results for input(s): TSH, T4TOTAL, FREET4, T3FREE, THYROIDAB in the last 72 hours. Anemia Panel: No results for input(s): VITAMINB12, FOLATE, FERRITIN, TIBC, IRON, RETICCTPCT in the last 72 hours. Sepsis Labs: No results for input(s): PROCALCITON, LATICACIDVEN in the last 168 hours.  Recent Results (from the past 240 hour(s))  Culture, blood (Routine X 2) w Reflex to ID Panel     Status: None   Collection Time: 05/03/20  2:57 PM   Specimen: BLOOD RIGHT HAND  Result Value Ref Range Status   Specimen Description BLOOD RIGHT HAND  Final   Special Requests   Final     BOTTLES DRAWN AEROBIC ONLY Blood Culture adequate volume   Culture   Final    NO GROWTH 5 DAYS Performed at [facility], [address] [place], [place] [address]    Report Status 05/08/2020 FINAL  Final  Culture, blood (Routine X 2) w Reflex to ID Panel     Status: None   Collection Time: 05/03/20  2:57 PM   Specimen: BLOOD  Result Value Ref Range Status   Specimen Description BLOOD RIGHT ANTECUBITAL  Final   Special Requests   Final    BOTTLES DRAWN AEROBIC ONLY Blood Culture adequate volume   Culture   Final    NO GROWTH 5 DAYS Performed at [facility], [place] [address] [place], [place] [address]    Report Status 05/08/2020 FINAL  Final      Radiology Studies: No results found.  Scheduled Meds: . buprenorphine-naloxone  1 tablet Sublingual BID  . carvedilol  3.125 mg Oral BID WC  . dextromethorphan-guaiFENesin  1 tablet Oral BID  . lidocaine  1 patch Transdermal Q24H  . magnesium oxide  400 mg Oral BID  . QUEtiapine  50 mg Oral BID   Continuous Infusions: . sodium chloride Stopped (05/02/20 2205)  . vancomycin 750 mg (05/12/20 0822)     LOS: 15 days   Time spent: 75 min   Darliss Cheney, MD [facility]  05/12/2020, 12:32 PM   To contact the attending provider between 7A-7P or the covering provider during after hours 7P-7A, please log into the web site [email or web address].

## 2020-05-13 ENCOUNTER — Inpatient Hospital Stay (HOSPITAL_COMMUNITY): Payer: Medicaid Other

## 2020-05-13 LAB — COMPREHENSIVE METABOLIC PANEL
ALT: 162 U/L — ABNORMAL HIGH (ref 0–44)
AST: 138 U/L — ABNORMAL HIGH (ref 15–41)
Albumin: 2.6 g/dL — ABNORMAL LOW (ref 3.5–5.0)
Alkaline Phosphatase: 133 U/L — ABNORMAL HIGH (ref 38–126)
Anion gap: 7 (ref 5–15)
BUN: 15 mg/dL (ref 6–20)
CO2: 25 mmol/L (ref 22–32)
Calcium: 9 mg/dL (ref 8.9–10.3)
Chloride: 103 mmol/L (ref 98–111)
Creatinine, Ser: 0.51 mg/dL (ref 0.44–1.00)
GFR calc Af Amer: >60 mL/min (ref >60)
GFR calc non Af Amer: >60 mL/min (ref >60)
Glucose, Bld: 93 mg/dL (ref 70–99)
Potassium: 4.4 mmol/L (ref 3.5–5.1)
Sodium: 135 mmol/L (ref 135–145)
Total Bilirubin: 0.4 mg/dL (ref 0.3–1.2)
Total Protein: 8.9 g/dL — ABNORMAL HIGH (ref 6.5–8.1)

## 2020-05-13 LAB — CBC WITH DIFFERENTIAL/PLATELET
Abs Immature Granulocytes: 0.02 10*3/uL (ref 0.00–0.07)
Basophils Absolute: 0.1 10*3/uL (ref 0.0–0.1)
Basophils Relative: 1 %
Eosinophils Absolute: 0.2 10*3/uL (ref 0.0–0.5)
Eosinophils Relative: 2 %
HCT: 32.9 % — ABNORMAL LOW (ref 36.0–46.0)
Hemoglobin: 9.9 g/dL — ABNORMAL LOW (ref 12.0–15.0)
Immature Granulocytes: 0 %
Lymphocytes Relative: 47 %
Lymphs Abs: 4.3 10*3/uL — ABNORMAL HIGH (ref 0.7–4.0)
MCH: 28 pg (ref 26.0–34.0)
MCHC: 30.1 g/dL (ref 30.0–36.0)
MCV: 93.2 fL (ref 80.0–100.0)
Monocytes Absolute: 0.8 10*3/uL (ref 0.1–1.0)
Monocytes Relative: 9 %
Neutro Abs: 3.6 10*3/uL (ref 1.7–7.7)
Neutrophils Relative %: 41 %
Platelets: 281 10*3/uL (ref 150–400)
RBC: 3.53 MIL/uL — ABNORMAL LOW (ref 3.87–5.11)
RDW: 18.6 % — ABNORMAL HIGH (ref 11.5–15.5)
WBC: 9 10*3/uL (ref 4.0–10.5)
nRBC: 0 % (ref 0.0–0.2)

## 2020-05-13 LAB — MAGNESIUM: Magnesium: 1.8 mg/dL (ref 1.7–2.4)

## 2020-05-13 NOTE — Progress Notes (Signed)
PROGRESS NOTE    Candice Jordan  MVV:[id number] DOB: 27-Nov-2001 DOA: 04/27/2020 PCP: System, Provider Not In   Brief Narrative:  19 year old female presents to [facility] ED on 5/25 with reported 1 week of shortness of breath. Patient was found at [address] with oxygen saturation 79%. Reports using Heroin last night. Diagnosed with MRSA Bacteremia/Endocarditis and COVID 03/08/2020 however has left AMA numerous times throughout the month of April. On arrival to ED. CXR revealing of large right sided tension Pneumothorax. Chest Tube placed. 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 She has been IVC'd. IVC was renewed on 05/05/2020.  The patient has had continual tachycardia since admission. Likely due to [place] secondary to chronic cocaine and heroin abuse. CTA chest has ruled out PE, but does demonstrate moderate bilateral pleural effusions.  I have discussed the pleural effusions with IR. It seems that they are small and the patient is unlikely to benefit from thoracentesis.   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 Patient met criteria for sepsis on admission with tachypnea, tachycardia, leukocytosis, concern for untreated bacteremia. Repeat 2D echo which was done showed  worsening/severe tricuspid valvular regurgitation, tricuspid valve vegetation attached to posterior leaflet measuring 0.9 cm x 0.5 cm more apparent on this study the last study. Severely dilated right atrial size. Hyperdynamic right ventricular systolic function.CT surgery was consulted 04/28/2020 for possible valve repair however due to ongoing IV drug abuse patient at high risk for reinfection if she were to undergo valve replacement and recommending focusing on drug rehabilitation. Per CT surgery obtaining a TEE will not change surgical candidacy and recommending medical management for now. Repeat blood cultures from 04/29/2028 + for staph aureus. Repeat blood cultures drawn on 05/03/2020 which were negative.  ID following and patient remains on IV vancomycin with estimated end date of therapy planned for 06/14/2020 per ID.  Acute hypoxemic respiratory distress secondary to right-sided tensionpneumothorax status post chest tube placement: Chest tube now removed. 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 Chest tube removed 04/28/2020 per PCCM. Repeat chest x-ray done showed no recurrent pneumothorax.  Recent CT angiogram of the chest done on 05/07/2020 shows significant improvement in right pneumothorax. Pulmonary hygiene. Mucinex added due to ongoing cough. Place on Hycodan as needed. Supportive care.   Bilateral moderate pleural effusions. CTA chest has demonstrated bilateral moderate pleural effusions. Will monitor and consider thoracentesis, however, the patient's history of tension pneumothorax is concerning for risk of recurrence. previous hospitalist have discussed the patient with Dr. Larena Glassman. He states that in his estimation the pleural effusions are small and do not warrant intervention at this time.  Patient denied any chest pain or shortness of breath.  We will repeat  follow-up chest x-ray  today.  Tachycardia: Likely due to chronic IV abuse of heroin and cocaine and endocarditis. CTA chest negative for PE.   Polysubstance abuse/heroin use: UDS noted at outside hospital to be positive for amphetamines. Patient noted to have multiple admissions for endocarditis, where she has left AMA . Patient seen by psychiatry on 04/27/2020 and is felt patient lacks capacity to make an informed medical decision regarding her medical care and disposition and recommending social work evaluation to contact next of kin no appropriate hospital administrator to help with decision-making process. Continue Suboxone. Was on clonidine taper/detox protocol. Patient wanted to leave AMA again (04/29/2020). Patient however did state she understands the risk of leaving AMA which include death. Was also felt by PCCM that patient lacked capacity poor insight into her current medical situation.Patient with poor insight into her current medical situation. Patient not thinking rationally. Patient has subsequently been IVC'd to finish out treatment for MRSA disseminated infection/endocarditis with worsening tricuspid regurgitation.Patient has been reassessed by psych who are in agreement that patient lacks capacity.   Anemiaof chronic disease: Anemia panel consistent with anemia of chronic disease. Status post transfusion 1 unit packed red blood cells. Hemoglobin 8.8 yesterday.  Hypomagnesemia/hypokalemia: Labs pending.  Positive hep C antibody/transaminitis: HCV RNA quant of 2260. Outpatient follow-up with ID.   Back pain: According to patient, her back pain is getting better.  She does not allow me to examine her back.  Allergic rash/contact dermatitis: Patient had complained of some rash and itching at her lower back on 05/11/2020.  I examined her using a female chaperone/patient's primary nurse.  She had some maculopapular rash but no open sores or any drainage.  This was likely contact dermatitis.  She is on  hydrocortisone.  According to her, her itching is better and her rash is improving.  She does not allow me to examine her back despite of female chaperone present at bedside.  DVT prophylaxis: SCD   Code Status: Full Code  Family Communication:  None present at bedside.  Plan of care discussed with patient in length and he verbalized understanding and agreed with it. Patient is from: Home Disposition Plan: Home Barriers to discharge: Need for continuation of IV antibiotics to treat endocarditis  Status is: Inpatient  Remains inpatient appropriate because:Inpatient level of care appropriate due to severity of illness   Dispo: The patient is from: Home              Anticipated d/c is to: Home              Anticipated d/c date is: > 3 days              Patient currently is not medically stable to d/c.        Estimated body mass index is 18.59 kg/m as calculated from the following:   Height as of this encounter: 5' 5"  (1.651 m).   Weight as of this encounter: 50.7 kg.      Nutritional status:               Consultants:   ID, cardiothoracic surgery  Procedures:   None  Antimicrobials:  Anti-infectives (From admission, onward)   Start     Dose/Rate Route Frequency Ordered Stop   05/07/20 1430  vancomycin (VANCOREADY) IVPB 750 mg/150 mL     Discontinue     750 mg 150 mL/hr over 60 Minutes Intravenous Every 8 hours 05/07/20 1313     05/01/20 1430  vancomycin (VANCOCIN) IVPB 1000 mg/200 mL premix  Status:  Discontinued        1,000 mg 200 mL/hr over 60 Minutes Intravenous Every 8 hours 05/01/20 0946 05/07/20 1313   04/30/20 0600  vancomycin (VANCOCIN) IVPB 1000 mg/200 mL premix  Status:  Discontinued        1,000 mg 200 mL/hr over 60 Minutes Intravenous Every 8 hours 04/29/20 2318 05/01/20 0947   04/29/20 2330  vancomycin (VANCOREADY) IVPB 500 mg/100 mL  Status:  Discontinued        500 mg 100 mL/hr over 60 Minutes Intravenous Every 8 hours 04/29/20 1648  04/29/20 2315   04/27/20 1100  vancomycin (VANCOREADY) IVPB 500 mg/100 mL  Status:  Discontinued        500 mg 100 mL/hr over 60 Minutes Intravenous Every 8 hours 04/27/20 0210 04/29/20 1648   04/27/20 0300  vancomycin (VANCOCIN) IVPB 1000 mg/200 mL premix        1,000 mg 200 mL/hr over 60 Minutes Intravenous  Once 04/27/20 0203 04/27/20 0423   04/27/20 0300  piperacillin-tazobactam (ZOSYN) IVPB 3.375 g  Status:  Discontinued        3.375 g 12.5 mL/hr over 240 Minutes Intravenous Every 8 hours 04/27/20 0210 04/27/20 0856         Subjective: Seen and examined.  Sitting in the bed and eating her breakfast.  She is the most alert I have seen in last 3 days.  She had no complaint until I asked about back pain and then she told me that she still has back pain but it is getting better.  She looks comfortable.  Objective: Vitals:   05/12/20 1607 05/12/20 2020 05/13/20 0014 05/13/20 0433  BP: 104/80 101/69 100/64 106/73  Pulse: (!) 126 (!) 114 (!) 110 (!) 115  Resp: 18 15 16 18   Temp: 98.4 F (36.9 C) 99 F (37.2 C) 98.4 F (36.9 C) 97.9 F (36.6 C)  TempSrc: Oral Oral Oral Oral  SpO2: 98% 98% 99% 97%  Weight:      Height:        Intake/Output Summary (Last 24 hours) at 05/13/2020 1037 Last data filed at 05/13/2020 0200 Gross per 24 hour  Intake 630 ml  Output 0 ml  Net 630 ml   Filed Weights   05/07/20 0346 05/08/20 0625 05/09/20 1801  Weight: 49.9 kg 50.4 kg 50.7 kg    Examination:  General exam: Appears calm and comfortable  Respiratory system: Clear to auscultation. Respiratory effort normal. Cardiovascular system: S1 & S2 heard, RRR. No JVD. 3/6 murmur No pedal edema. Gastrointestinal system: Abdomen is nondistended, soft and nontender. No organomegaly or masses felt. Normal bowel sounds heard. Central nervous system: Alert and oriented. No focal neurological deficits. Extremities: Symmetric 5 x 5 power. Psychiatry: Judgement and insight appear poor.  Mood & affect  flat.   Data Reviewed: I have personally reviewed following labs and imaging studies  CBC: Recent Labs  Lab 05/10/20 0323 05/12/20 0623 05/13/20 0828  WBC 10.6* 9.6 9.0  NEUTROABS  --  4.3 3.6  HGB 8.8* 9.5* 9.9*  HCT 29.6* 32.4* 32.9*  MCV 91.9 92.8 93.2  PLT 256 289 026   Basic Metabolic Panel: Recent Labs  Lab 05/10/20 0323 05/11/20 0907 05/12/20 0623  NA 134*  --  135  K 4.2  --  4.3  CL 101  --  102  CO2 25  --  24  GLUCOSE 100*  --  93  BUN 16  --  17  CREATININE 0.54  --  0.57  CALCIUM 8.6*  --  9.0  MG  --  1.7 1.6*   GFR: Estimated Creatinine Clearance: 89.8 mL/min (by C-G formula based on SCr of 0.57 mg/dL). Liver Function Tests: No results for input(s): AST, ALT, ALKPHOS, BILITOT, PROT, ALBUMIN in the last 168 hours. No results for input(s): LIPASE, AMYLASE in the last 168 hours. No results for input(s): AMMONIA in the last 168 hours. Coagulation Profile: No results for input(s): INR, PROTIME in the last 168 hours. Cardiac Enzymes: No results for input(s): CKTOTAL, CKMB, CKMBINDEX, TROPONINI in the last 168 hours. BNP (last 3 results) No results for input(s): PROBNP in the last 8760 hours. HbA1C: No results for input(s): HGBA1C in the last 72 hours. CBG: No results for input(s): GLUCAP in the last 168 hours. Lipid Profile: No results for input(s): CHOL, HDL, LDLCALC, TRIG, CHOLHDL, LDLDIRECT in the last 72 hours. Thyroid Function Tests: No results for input(s): TSH, T4TOTAL, FREET4, T3FREE, THYROIDAB in the last 72 hours. Anemia Panel: No results for input(s): VITAMINB12, FOLATE, FERRITIN, TIBC, IRON, RETICCTPCT in the last 72 hours. Sepsis Labs: No results for input(s): PROCALCITON, LATICACIDVEN in the last 168 hours.  Recent Results (from the past 240 hour(s))  Culture, blood (Routine X 2) w Reflex to ID Panel     Status: None   Collection Time: 05/03/20  2:57 PM   Specimen: BLOOD RIGHT HAND  Result Value Ref Range Status   Specimen  Description BLOOD RIGHT HAND  Final   Special Requests   Final    BOTTLES DRAWN AEROBIC ONLY Blood Culture adequate volume   Culture   Final    NO GROWTH 5 DAYS Performed at [facility], [address] [place], [place] [address]    Report Status 05/08/2020 FINAL  Final  Culture, blood (Routine X 2) w Reflex to ID Panel     Status: None   Collection Time: 05/03/20  2:57 PM   Specimen: BLOOD  Result Value Ref Range Status   Specimen Description BLOOD RIGHT ANTECUBITAL  Final   Special Requests   Final    BOTTLES DRAWN AEROBIC ONLY Blood Culture adequate volume   Culture   Final    NO GROWTH 5 DAYS Performed at [facility], [address] [place], [place] [address]    Report Status 05/08/2020 FINAL  Final      Radiology Studies: No results found.  Scheduled Meds: . buprenorphine-naloxone  1 tablet Sublingual BID  . carvedilol  3.125 mg Oral BID WC  . dextromethorphan-guaiFENesin  1 tablet Oral BID  . hydrocortisone cream   Topical BID  . lidocaine  1 patch Transdermal Q24H  . magnesium oxide  400 mg Oral BID  . QUEtiapine  50 mg Oral BID   Continuous Infusions: . sodium chloride Stopped (05/02/20 2205)  . vancomycin 750 mg (05/13/20 0830)     LOS: 16 days   Time spent: 28 min   Darliss Cheney, MD [facility]  05/13/2020, 10:37 AM   To contact the attending provider between 7A-7P or the covering provider during after hours 7P-7A, please log into the web site [email or web address].

## 2020-05-13 NOTE — Plan of Care (Signed)
  Problem: Safety: Goal: Ability to remain free from injury will improve Outcome: Progressing   Problem: Skin Integrity: Goal: Risk for impaired skin integrity will decrease Outcome: Progressing   

## 2020-05-14 LAB — VANCOMYCIN, TROUGH: Vancomycin Tr: 16 ug/mL (ref 15–20)

## 2020-05-14 NOTE — Progress Notes (Signed)
Pharmacy Antibiotic Note  Candice Jordan is a 19 y.o. female admitted on 04/27/2020 with MRSA bacteremia/TV endocarditis and septic emboli.   Scr stable at 0.51. Afebrile. WBC stable at 9. Vancomycin was reduced to 750 mg every 8 hours on 6/5.  Trough today is 16 mcg/ml. Level was drawn approximately 50 minutes early. True trough is likely just slightly below 15, but not clinically significant enough to change dose. Vancomycin is at steadystate. Plan treatment through 7/13  Plan: Continue Vancomycin to 750 mg IV every 8 hours.   Plan repeat vanc trough in a few days.  Will follow renal function and clinical progress   Height: 5\' 5"  (165.1 cm) Weight: 50.7 kg (111 lb 11.2 oz) IBW/kg (Calculated) : 57  Temp (24hrs), Avg:98.7 F (37.1 C), Min:98.2 F (36.8 C), Max:99 F (37.2 C)  Recent Labs  Lab 05/10/20 0323 05/10/20 1527 05/12/20 0623 05/13/20 0828 05/14/20 0650  WBC 10.6*  --  9.6 9.0  --   CREATININE 0.54  --  0.57 0.51  --   VANCOTROUGH  --  14*  --   --  16    Estimated Creatinine Clearance: 89.8 mL/min (by C-G formula based on SCr of 0.51 mg/dL).    No Known Allergies  Antimicrobials this admission: Vancomycin 5/26>> Zosyn 5/25>>5/26  Dose adjustments this admission: -5/28: VT 8 - change from 500 mg IV every 8 hours to 1000 mg IV every 8 hours -5/30: VT 16 - continue same regimen -6/4: VT 29 - collected during infusion, ordered another VT for 6/5 -6/5: VT 22 - Change from 1000 mg IV Q8H to 750 mg IV Q8H -6/8: VT 14-continue 750mg  IV q8h for now -6/12 VT 16-continue 750 mg IV q8h   Microbiology results: 5/25 Bcx (at OSH): GPC in clusters (2/2) -MRSA  5/26BCx: staph aureus 2/2; BCID w/ MRSA 1/4 5/26UCx: neg 5/26MRSA PCR: pos  5/28 Bcx: staph aurus 2/2 6/1 Bcx: NGTD  6/28, PharmD PGY1 Acute Care Pharmacy Resident Please utilize Amion for appropriate phone number to reach the unit pharmacist [facility]) 05/14/2020 12:59 PM

## 2020-05-14 NOTE — Progress Notes (Signed)
PROGRESS NOTE    Candice Jordan  BTD:[id number] DOB: Aug 21, 2001 DOA: 04/27/2020 PCP: System, Provider Not In   Brief Narrative:  19 year old female presents to [facility] ED on 5/25 with reported 1 week of shortness of breath. Patient was found at [address] with oxygen saturation 79%. Reports using Heroin last night. Diagnosed with MRSA Bacteremia/Endocarditis and COVID 03/08/2020 however has left AMA numerous times throughout the month of April. On arrival to ED. CXR revealing of large right sided tension Pneumothorax. Chest Tube placed. 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 She has been IVC'd. IVC was renewed on 05/05/2020.  The patient has had continual tachycardia since admission. Likely due to [place] secondary to chronic cocaine and heroin abuse. CTA chest has ruled out PE, but does demonstrate moderate bilateral pleural effusions.  I have discussed the pleural effusions with IR. It seems that they are small and the patient is unlikely to benefit from thoracentesis.   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 Patient met criteria for sepsis on admission with tachypnea, tachycardia, leukocytosis, concern for untreated bacteremia. Repeat 2D echo which was done showed  worsening/severe tricuspid valvular regurgitation, tricuspid valve vegetation attached to posterior leaflet measuring 0.9 cm x 0.5 cm more apparent on this study the last study. Severely dilated right atrial size. Hyperdynamic right ventricular systolic function.CT surgery was consulted 04/28/2020 for possible valve repair however due to ongoing IV drug abuse patient at high risk for reinfection if she were to undergo valve replacement and recommending focusing on drug rehabilitation. Per CT surgery obtaining a TEE will not change surgical candidacy and recommending medical management for now. Repeat blood cultures from 04/29/2028 + for staph aureus. Repeat blood cultures drawn on 05/03/2020 which were negative.  ID following and patient remains on IV vancomycin with estimated end date of therapy planned for 06/14/2020 per ID.  Acute hypoxemic respiratory distress secondary to right-sided tensionpneumothorax status post chest tube placement: Chest tube now removed. 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 Chest tube removed 04/28/2020 per PCCM. Repeat chest x-ray done showed no recurrent pneumothorax.  Recent CT angiogram of the chest done on 05/07/2020 shows significant improvement in right pneumothorax. Pulmonary hygiene. Mucinex added due to ongoing cough. Place on Hycodan as needed. Supportive care.   Bilateral moderate pleural effusions. CTA chest has demonstrated bilateral moderate pleural effusions. Will monitor and consider thoracentesis, however, the patient's history of tension pneumothorax is concerning for risk of recurrence. previous hospitalist have discussed the patient with Dr. Larena Glassman. He states that in his estimation the pleural effusions are small and do not warrant intervention at this time.  Repeat chest x-ray from yesterday shows possible bulla or small contained pneumothorax but  patient remains  asymptomatic.  Tachycardia: Likely due to chronic IV abuse of heroin and cocaine and endocarditis. CTA chest negative for PE.  Blood pressure remains on the low normal side.  Hesitant to start on beta-blockers.  She is asymptomatic.  Polysubstance abuse/heroin use: UDS noted at outside hospital to be positive for amphetamines. Patient noted to have multiple admissions for endocarditis, where she has left AMA . Patient seen by psychiatry on 04/27/2020 and is felt patient lacks capacity to make an informed medical decision regarding her medical care and disposition and recommending social work evaluation to contact next of kin no appropriate hospital administrator to help with decision-making process. Continue Suboxone. Was on clonidine taper/detox protocol. Patient wanted to leave AMA again (04/29/2020). Patient however did state she understands the risk of leaving AMA which include death. Was also felt by PCCM that patient lacked capacity poor insight into her current medical situation.Patient with poor insight into her current medical situation. Patient not thinking rationally. Patient has subsequently been IVC'd to finish out treatment for MRSA disseminated infection/endocarditis with worsening tricuspid regurgitation.Patient has been reassessed by psych who are in agreement that patient lacks capacity.   Anemiaof chronic disease: Anemia panel consistent with anemia of chronic disease. Status post transfusion 1 unit packed red blood cells. Hemoglobin 8.8 yesterday.  Hypomagnesemia/hypokalemia: Labs pending.  Positive hep C antibody/transaminitis: HCV RNA quant of 2260. Outpatient follow-up with ID.   Back pain: Resolved according to patient.  Allergic rash/contact dermatitis: Patient had complained of some rash and itching at her lower back on 05/11/2020.  I examined her using a female chaperone/patient's primary nurse.  She had some maculopapular rash but no open sores or any drainage.   This was likely contact dermatitis.  She is on hydrocortisone.  According to her, her itching has resolved..  She does not allow me to examine her back despite of female chaperone present at bedside.  DVT prophylaxis: SCD   Code Status: Full Code  Family Communication:  None present at bedside.  Plan of care discussed with patient in length and he verbalized understanding and agreed with it. Patient is from: Home Disposition Plan: Home Barriers to discharge: Need for continuation of IV antibiotics to treat endocarditis  Status is: Inpatient  Remains inpatient appropriate because:Inpatient level of care appropriate due to severity of illness   Dispo: The patient is from: Home              Anticipated d/c is to: Home              Anticipated d/c date is: > 3 days              Patient currently is not medically stable to d/c.        Estimated body mass index is 18.59 kg/m as calculated from the following:   Height as of this encounter: _0  (1.651 m).   Weight as of this encounter: 50.7 kg.      Nutritional status:               Consultants:   ID, cardiothoracic surgery  Procedures:   None  Antimicrobials:  Anti-infectives (From admission, onward)   Start     Dose/Rate Route Frequency Ordered Stop   05/07/20 1430  vancomycin (VANCOREADY) IVPB 750 mg/150 mL     Discontinue     750 mg 150 mL/hr over 60 Minutes Intravenous Every 8 hours 05/07/20 1313     05/01/20 1430  vancomycin (VANCOCIN) IVPB  1000 mg/200 mL premix  Status:  Discontinued        1,000 mg 200 mL/hr over 60 Minutes Intravenous Every 8 hours 05/01/20 0946 05/07/20 1313   04/30/20 0600  vancomycin (VANCOCIN) IVPB 1000 mg/200 mL premix  Status:  Discontinued        1,000 mg 200 mL/hr over 60 Minutes Intravenous Every 8 hours 04/29/20 2318 05/01/20 0947   04/29/20 2330  vancomycin (VANCOREADY) IVPB 500 mg/100 mL  Status:  Discontinued        500 mg 100 mL/hr over 60 Minutes Intravenous Every 8  hours 04/29/20 1648 04/29/20 2315   04/27/20 1100  vancomycin (VANCOREADY) IVPB 500 mg/100 mL  Status:  Discontinued        500 mg 100 mL/hr over 60 Minutes Intravenous Every 8 hours 04/27/20 0210 04/29/20 1648   04/27/20 0300  vancomycin (VANCOCIN) IVPB 1000 mg/200 mL premix        1,000 mg 200 mL/hr over 60 Minutes Intravenous  Once 04/27/20 0203 04/27/20 0423   04/27/20 0300  piperacillin-tazobactam (ZOSYN) IVPB 3.375 g  Status:  Discontinued        3.375 g 12.5 mL/hr over 240 Minutes Intravenous Every 8 hours 04/27/20 0210 04/27/20 0856         Subjective: Seen and examined.  She has no complaints.  Denied any back pain or itching or any shortness of breath.  Objective: Vitals:   05/13/20 1658 05/13/20 2010 05/14/20 0017 05/14/20 0413  BP: 105/63 100/66 101/65 101/66  Pulse: (!) 113 (!) 113 (!) 109 (!) 113  Resp: _0 Temp: 98.8 F (37.1 C) 99 F (37.2 C) 98.6 F (37 C) 98.2 F (36.8 C)  TempSrc: Oral Oral Axillary Axillary  SpO2: 98% 98% 96% 100%  Weight:      Height:        Intake/Output Summary (Last 24 hours) at 05/14/2020 1013 Last data filed at 05/14/2020 0010 Gross per 24 hour  Intake 590 ml  Output --  Net 590 ml   Filed Weights   05/07/20 0346 05/08/20 0625 05/09/20 1801  Weight: 49.9 kg 50.4 kg 50.7 kg    Examination:  General exam: Appears calm and comfortable  Respiratory system: Clear to auscultation. Respiratory effort normal. Cardiovascular system: S1 & S2 heard, RRR. No JVD, murmurs, rubs, gallops or clicks. No pedal edema. Gastrointestinal system: Abdomen is nondistended, soft and nontender. No organomegaly or masses felt. Normal bowel sounds heard. Central nervous system: Alert and oriented. No focal neurological deficits. Extremities: Symmetric 5 x 5 power. Psychiatry: Judgement and insight appear poor. Mood & affect flat.    Data Reviewed: I have personally reviewed following labs and imaging studies  CBC: Recent Labs  Lab  05/10/20 0323 05/12/20 0623 05/13/20 0828  WBC 10.6* 9.6 9.0  NEUTROABS  --  4.3 3.6  HGB 8.8* 9.5* 9.9*  HCT 29.6* 32.4* 32.9*  MCV 91.9 92.8 93.2  PLT 256 289 997   Basic Metabolic Panel: Recent Labs  Lab 05/10/20 0323 05/11/20 0907 05/12/20 0623 05/13/20 0828  NA 134*  --  135 135  K 4.2  --  4.3 4.4  CL 101  --  102 103  CO2 25  --  24 25  GLUCOSE 100*  --  93 93  BUN 16  --  17 15  CREATININE 0.54  --  0.57 0.51  CALCIUM 8.6*  --  9.0 9.0  MG  --  1.7 1.6* 1.8  GFR: Estimated Creatinine Clearance: 89.8 mL/min (by C-G formula based on SCr of 0.51 mg/dL). Liver Function Tests: Recent Labs  Lab 05/13/20 0828  AST 138*  ALT 162*  ALKPHOS 133*  BILITOT 0.4  PROT 8.9*  ALBUMIN 2.6*   No results for input(s): LIPASE, AMYLASE in the last 168 hours. No results for input(s): AMMONIA in the last 168 hours. Coagulation Profile: No results for input(s): INR, PROTIME in the last 168 hours. Cardiac Enzymes: No results for input(s): CKTOTAL, CKMB, CKMBINDEX, TROPONINI in the last 168 hours. BNP (last 3 results) No results for input(s): PROBNP in the last 8760 hours. HbA1C: No results for input(s): HGBA1C in the last 72 hours. CBG: No results for input(s): GLUCAP in the last 168 hours. Lipid Profile: No results for input(s): CHOL, HDL, LDLCALC, TRIG, CHOLHDL, LDLDIRECT in the last 72 hours. Thyroid Function Tests: No results for input(s): TSH, T4TOTAL, FREET4, T3FREE, THYROIDAB in the last 72 hours. Anemia Panel: No results for input(s): VITAMINB12, FOLATE, FERRITIN, TIBC, IRON, RETICCTPCT in the last 72 hours. Sepsis Labs: No results for input(s): PROCALCITON, LATICACIDVEN in the last 168 hours.  No results found for this or any previous visit (from the past 240 hour(s)).    Radiology Studies: DG CHEST PORT 1 VIEW  Result Date: 05/13/2020 CLINICAL DATA:  Recent cavitary pneumonia.  Recent pneumothorax. EXAM: PORTABLE CHEST 1 VIEW COMPARISON:  CT angiogram  chest May 07, 2020; chest radiograph Apr 28, 2020 FINDINGS: Question small loculated pneumothorax versus bulla right base, stable. Scattered apparent bullae elsewhere, also present on recent CT. Areas of mild scarring noted. Atelectatic change is present in the left base. No well-defined consolidation evident. The cavitary nodular lesions seen in the upper lobes on recent CT are not seen by radiography. Heart is upper normal in size with pulmonary vascularity normal. No adenopathy. No bone lesions IMPRESSION: There is scattered bullae and scarring. Question focal bulla versus small loculated pneumothorax right base, stable. No larger pneumothorax evident. Atelectatic change noted left base. Small cavitary nodular opacity seen in the upper lobes on recent CT not appreciable radiography. No consolidation. Heart is normal in size.  No adenopathy appreciable. Electronically Signed   By: Lowella Grip III M.D.   On: 05/13/2020 13:21    Scheduled Meds: . buprenorphine-naloxone  1 tablet Sublingual BID  . carvedilol  3.125 mg Oral BID WC  . dextromethorphan-guaiFENesin  1 tablet Oral BID  . hydrocortisone cream   Topical BID  . lidocaine  1 patch Transdermal Q24H  . magnesium oxide  400 mg Oral BID  . QUEtiapine  50 mg Oral BID   Continuous Infusions: . sodium chloride Stopped (05/02/20 2205)  . vancomycin 750 mg (05/14/20 0912)     LOS: 17 days   Time spent: 25 min   Darliss Cheney, MD [facility]  05/14/2020, 10:13 AM   To contact the attending provider between 7A-7P or the covering provider during after hours 7P-7A, please log into the web site [email or web address].

## 2020-05-15 LAB — CBC WITH DIFFERENTIAL/PLATELET
Abs Immature Granulocytes: 0.02 10*3/uL (ref 0.00–0.07)
Basophils Absolute: 0.1 10*3/uL (ref 0.0–0.1)
Basophils Relative: 1 %
Eosinophils Absolute: 0.1 10*3/uL (ref 0.0–0.5)
Eosinophils Relative: 2 %
HCT: 31.9 % — ABNORMAL LOW (ref 36.0–46.0)
Hemoglobin: 9.6 g/dL — ABNORMAL LOW (ref 12.0–15.0)
Immature Granulocytes: 0 %
Lymphocytes Relative: 45 %
Lymphs Abs: 3.3 10*3/uL (ref 0.7–4.0)
MCH: 28.1 pg (ref 26.0–34.0)
MCHC: 30.1 g/dL (ref 30.0–36.0)
MCV: 93.3 fL (ref 80.0–100.0)
Monocytes Absolute: 0.6 10*3/uL (ref 0.1–1.0)
Monocytes Relative: 9 %
Neutro Abs: 3.1 10*3/uL (ref 1.7–7.7)
Neutrophils Relative %: 43 %
Platelets: 214 10*3/uL (ref 150–400)
RBC: 3.42 MIL/uL — ABNORMAL LOW (ref 3.87–5.11)
RDW: 18.2 % — ABNORMAL HIGH (ref 11.5–15.5)
WBC: 7.2 10*3/uL (ref 4.0–10.5)
nRBC: 0 % (ref 0.0–0.2)

## 2020-05-15 LAB — MAGNESIUM: Magnesium: 1.5 mg/dL — ABNORMAL LOW (ref 1.7–2.4)

## 2020-05-15 LAB — BASIC METABOLIC PANEL
Anion gap: 10 (ref 5–15)
BUN: 11 mg/dL (ref 6–20)
CO2: 26 mmol/L (ref 22–32)
Calcium: 9 mg/dL (ref 8.9–10.3)
Chloride: 100 mmol/L (ref 98–111)
Creatinine, Ser: 0.63 mg/dL (ref 0.44–1.00)
GFR calc Af Amer: >60 mL/min (ref >60)
GFR calc non Af Amer: >60 mL/min (ref >60)
Glucose, Bld: 131 mg/dL — ABNORMAL HIGH (ref 70–99)
Potassium: 3.9 mmol/L (ref 3.5–5.1)
Sodium: 136 mmol/L (ref 135–145)

## 2020-05-15 MED ORDER — CARVEDILOL 6.25 MG PO TABS
6.2500 mg | ORAL_TABLET | Freq: Two times a day (BID) | ORAL | Status: DC
  Administered 2020-05-15 – 2020-05-26 (×22): 6.25 mg via ORAL
  Filled 2020-05-15 (×22): qty 1

## 2020-05-15 MED ORDER — ENOXAPARIN SODIUM 40 MG/0.4ML ~~LOC~~ SOLN
40.0000 mg | SUBCUTANEOUS | Status: DC
  Filled 2020-05-15: qty 0.4

## 2020-05-15 MED ORDER — MAGNESIUM SULFATE 2 GM/50ML IV SOLN
2.0000 g | Freq: Once | INTRAVENOUS | Status: AC
  Administered 2020-05-15: 2 g via INTRAVENOUS
  Filled 2020-05-15: qty 50

## 2020-05-15 MED ORDER — METOPROLOL TARTRATE 12.5 MG HALF TABLET: 12.5000 mg | ORAL_TABLET | Freq: Two times a day (BID) | ORAL | Status: DC

## 2020-05-15 NOTE — Progress Notes (Signed)
PROGRESS NOTE    Candice Jordan  BTD:[id number] DOB: Aug 21, 2001 DOA: 04/27/2020 PCP: System, Provider Not In   Brief Narrative:  19 year old female presents to [facility] ED on 5/25 with reported 1 week of shortness of breath. Patient was found at [address] with oxygen saturation 79%. Reports using Heroin last night. Diagnosed with MRSA Bacteremia/Endocarditis and COVID 03/08/2020 however has left AMA numerous times throughout the month of April. On arrival to ED. CXR revealing of large right sided tension Pneumothorax. Chest Tube placed. 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 She has been IVC'd. IVC was renewed on 05/05/2020.  The patient has had continual tachycardia since admission. Likely due to [place] secondary to chronic cocaine and heroin abuse. CTA chest has ruled out PE, but does demonstrate moderate bilateral pleural effusions.  I have discussed the pleural effusions with IR. It seems that they are small and the patient is unlikely to benefit from thoracentesis.   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 Patient met criteria for sepsis on admission with tachypnea, tachycardia, leukocytosis, concern for untreated bacteremia. Repeat 2D echo which was done showed  worsening/severe tricuspid valvular regurgitation, tricuspid valve vegetation attached to posterior leaflet measuring 0.9 cm x 0.5 cm more apparent on this study the last study. Severely dilated right atrial size. Hyperdynamic right ventricular systolic function.CT surgery was consulted 04/28/2020 for possible valve repair however due to ongoing IV drug abuse patient at high risk for reinfection if she were to undergo valve replacement and recommending focusing on drug rehabilitation. Per CT surgery obtaining a TEE will not change surgical candidacy and recommending medical management for now. Repeat blood cultures from 04/29/2028 + for staph aureus. Repeat blood cultures drawn on 05/03/2020 which were negative.  ID following and patient remains on IV vancomycin with estimated end date of therapy planned for 06/14/2020 per ID.  Acute hypoxemic respiratory distress secondary to right-sided tensionpneumothorax status post chest tube placement: Chest tube now removed. 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 Chest tube removed 04/28/2020 per PCCM. Repeat chest x-ray done showed no recurrent pneumothorax.  Recent CT angiogram of the chest done on 05/07/2020 shows significant improvement in right pneumothorax. Pulmonary hygiene. Mucinex added due to ongoing cough. Place on Hycodan as needed. Supportive care.   Bilateral moderate pleural effusions. CTA chest has demonstrated bilateral moderate pleural effusions. Will monitor and consider thoracentesis, however, the patient's history of tension pneumothorax is concerning for risk of recurrence. previous hospitalist have discussed the patient with Dr. Larena Glassman. He states that in his estimation the pleural effusions are small and do not warrant intervention at this time.  Repeat chest x-ray from yesterday shows possible bulla or small contained pneumothorax but  patient remains  asymptomatic.  Tachycardia: Likely due to chronic IV abuse of heroin and cocaine and endocarditis. CTA chest negative for PE.  Blood pressure remains on the low normal side.  She is on again carvedilol 3.125.  Will increase to 6.25 p.o. twice daily.  Polysubstance abuse/heroin use: UDS noted at outside hospital to be positive for amphetamines. Patient noted to have multiple admissions for endocarditis, where she has left AMA . Patient seen by psychiatry on 04/27/2020 and is felt patient lacks capacity to make an informed medical decision regarding her medical care and disposition and recommending social work evaluation to contact next of kin no appropriate hospital administrator to help with decision-making process. Continue Suboxone. Was on clonidine taper/detox protocol. Patient wanted to leave AMA again (04/29/2020). Patient however did state she understands the risk of leaving AMA which include death. Was also felt by PCCM that patient lacked capacity poor insight into her current medical situation.Patient with poor insight into her current medical situation. Patient not thinking rationally. Patient has subsequently been IVC'd to finish out treatment for MRSA disseminated infection/endocarditis with worsening tricuspid regurgitation.Patient has been reassessed by psych who are in agreement that patient lacks capacity.   Anemiaof chronic disease: Anemia panel consistent with anemia of chronic disease. Status post transfusion 1 unit packed red blood cells. Hemoglobin 9.6 yesterday.  Hypomagnesemia/hypokalemia: Potassium normal.  Some hypomagnesemia.  Replace.  Positive hep C antibody/transaminitis: HCV RNA quant of 2260. Outpatient follow-up with ID.   Back pain: Resolved according to patient.  Allergic rash/contact dermatitis: Patient had complained of some rash and itching at her lower back on 05/11/2020.  I examined her using a female chaperone/patient's primary nurse.  She had some  maculopapular rash but no open sores or any drainage.  This was likely contact dermatitis.  She is on hydrocortisone.  According to her, her itching has resolved.   DVT prophylaxis: SCD   Code Status: Full Code  Family Communication:  None present at bedside.  Plan of care discussed with patient in length and he verbalized understanding and agreed with it. Patient is from: Home Disposition Plan: Home Barriers to discharge: Need for continuation of IV antibiotics to treat endocarditis  Status is: Inpatient  Remains inpatient appropriate because:Inpatient level of care appropriate due to severity of illness   Dispo: The patient is from: Home              Anticipated d/c is to: Home              Anticipated d/c date is: > 3 days              Patient currently is not medically stable to d/c.        Estimated body mass index is 18.59 kg/m as calculated from the following:   Height as of this encounter: 5\' 5"  (1.651 m).   Weight as of this encounter: 50.7 kg.      Nutritional status:               Consultants:   ID, cardiothoracic surgery  Procedures:   None  Antimicrobials:  Anti-infectives (From admission, onward)   Start     Dose/Rate Route Frequency Ordered Stop   05/07/20 1430  vancomycin (VANCOREADY) IVPB 750 mg/150 mL     Discontinue     750 mg 150 mL/hr over 60 Minutes Intravenous Every 8 hours 05/07/20 1313     05/01/20 1430  vancomycin (VANCOCIN) IVPB 1000 mg/200 mL premix  Status:  Discontinued        1,000 mg 200 mL/hr over 60 Minutes Intravenous Every 8 hours 05/01/20 0946 05/07/20 1313   04/30/20 0600  vancomycin (VANCOCIN) IVPB 1000 mg/200 mL premix  Status:  Discontinued        1,000 mg 200 mL/hr over 60 Minutes Intravenous Every 8 hours 04/29/20 2318 05/01/20 0947   04/29/20 2330  vancomycin (VANCOREADY) IVPB 500 mg/100 mL  Status:  Discontinued        500 mg 100 mL/hr over 60 Minutes Intravenous Every 8 hours 04/29/20 1648 04/29/20 2315     04/27/20 1100  vancomycin (VANCOREADY) IVPB 500 mg/100 mL  Status:  Discontinued        500 mg 100 mL/hr over 60 Minutes Intravenous Every 8 hours 04/27/20 0210 04/29/20 1648   04/27/20 0300  vancomycin (VANCOCIN) IVPB 1000 mg/200 mL premix        1,000 mg 200 mL/hr over 60 Minutes Intravenous  Once 04/27/20 0203 04/27/20 0423   04/27/20 0300  piperacillin-tazobactam (ZOSYN) IVPB 3.375 g  Status:  Discontinued        3.375 g 12.5 mL/hr over 240 Minutes Intravenous Every 8 hours 04/27/20 0210 04/27/20 0856         Subjective: Seen and examined.  She has no complaints.  Female sitter at bedside.  Objective: Vitals:   05/14/20 1500 05/14/20 1624 05/14/20 2133 05/15/20 0623  BP: 102/64 98/66 104/67 103/68  Pulse: (!) 106 (!) 108 (!) 114 (!) 107  Resp:   20 20  Temp: 98 F (36.7 C)  99.2 F (37.3 C) 98.7 F (37.1 C)  TempSrc: Oral  Oral Oral  SpO2: 100% 99% 98% 97%  Weight:      Height:        Intake/Output Summary (Last 24 hours) at 05/15/2020 1024 Last data filed at 05/15/2020 0600 Gross per 24 hour  Intake 990 ml  Output 0 ml  Net 990 ml   Filed Weights   05/07/20 0346 05/08/20 0625 05/09/20 1801  Weight: 49.9 kg 50.4 kg 50.7 kg    Examination:  General exam: Appears calm and comfortable  Respiratory system: Clear to auscultation. Respiratory effort normal. Cardiovascular system: S1 & S2 heard, RRR. No JVD, rubs, gallops or clicks. No pedal edema.  Systolic murmur. Gastrointestinal system: Abdomen is nondistended, soft and nontender. No organomegaly or masses felt. Normal bowel sounds heard. Central nervous system: Alert and oriented. No focal neurological deficits. Extremities: Symmetric 5 x 5 power. Skin: No rashes, lesions or ulcers.  Psychiatry: Judgement and insight appear poor. Mood & affect flat.   Data Reviewed: I have personally reviewed following labs and imaging studies  CBC: Recent Labs  Lab 05/10/20 0323 05/12/20 0623 05/13/20 0828  05/15/20 0858  WBC 10.6* 9.6 9.0 7.2  NEUTROABS  --  4.3 3.6 3.1  HGB 8.8* 9.5* 9.9* 9.6*  HCT 29.6* 32.4* 32.9* 31.9*  MCV 91.9 92.8 93.2 93.3  PLT 256 289 281 638   Basic Metabolic Panel: Recent Labs  Lab 05/10/20 0323 05/11/20 0907 05/12/20 0623 05/13/20 0828 05/15/20 0858  NA 134*  --  135 135 136  K 4.2  --  4.3 4.4 3.9  CL 101  --  102 103 100  CO2 25  --  _0 GLUCOSE 100*  --  93 93 131*  BUN 16  --  _1 CREATININE 0.54  --  0.57 0.51 0.63  CALCIUM 8.6*  --  9.0 9.0  9.0  MG  --  1.7 1.6* 1.8 1.5*   GFR: Estimated Creatinine Clearance: 89.8 mL/min (by C-G formula based on SCr of 0.63 mg/dL). Liver Function Tests: Recent Labs  Lab 05/13/20 0828  AST 138*  ALT 162*  ALKPHOS 133*  BILITOT 0.4  PROT 8.9*  ALBUMIN 2.6*   No results for input(s): LIPASE, AMYLASE in the last 168 hours. No results for input(s): AMMONIA in the last 168 hours. Coagulation Profile: No results for input(s): INR, PROTIME in the last 168 hours. Cardiac Enzymes: No results for input(s): CKTOTAL, CKMB, CKMBINDEX, TROPONINI in the last 168 hours. BNP (last 3 results) No results for input(s): PROBNP in the last 8760 hours. HbA1C: No results for input(s): HGBA1C in the last 72 hours. CBG: No results for input(s): GLUCAP in the last 168 hours. Lipid Profile: No results for input(s): CHOL, HDL, LDLCALC, TRIG, CHOLHDL, LDLDIRECT in the last 72 hours. Thyroid Function Tests: No results for input(s): TSH, T4TOTAL, FREET4, T3FREE, THYROIDAB in the last 72 hours. Anemia Panel: No results for input(s): VITAMINB12, FOLATE, FERRITIN, TIBC, IRON, RETICCTPCT in the last 72 hours. Sepsis Labs: No results for input(s): PROCALCITON, LATICACIDVEN in the last 168 hours.  No results found for this or any previous visit (from the past 240 hour(s)).    Radiology Studies: DG CHEST PORT 1 VIEW  Result Date: 05/13/2020 CLINICAL DATA:  Recent cavitary pneumonia.  Recent pneumothorax. EXAM:  PORTABLE CHEST 1 VIEW COMPARISON:  CT angiogram chest May 07, 2020; chest radiograph Apr 28, 2020 FINDINGS: Question small loculated pneumothorax versus bulla right base, stable. Scattered apparent bullae elsewhere, also present on recent CT. Areas of mild scarring noted. Atelectatic change is present in the left base. No well-defined consolidation evident. The cavitary nodular lesions seen in the upper lobes on recent CT are not seen by radiography. Heart is upper normal in size with pulmonary vascularity normal. No adenopathy. No bone lesions IMPRESSION: There is scattered bullae and scarring. Question focal bulla versus small loculated pneumothorax right base, stable. No larger pneumothorax evident. Atelectatic change noted left base. Small cavitary nodular opacity seen in the upper lobes on recent CT not appreciable radiography. No consolidation. Heart is normal in size.  No adenopathy appreciable. Electronically Signed   By: Lowella Grip III M.D.   On: 05/13/2020 13:21    Scheduled Meds: . buprenorphine-naloxone  1 tablet Sublingual BID  . carvedilol  6.25 mg Oral BID WC  . dextromethorphan-guaiFENesin  1 tablet Oral BID  . hydrocortisone cream   Topical BID  . lidocaine  1 patch Transdermal Q24H  . magnesium oxide  400 mg Oral BID  . QUEtiapine  50 mg Oral BID   Continuous Infusions: . sodium chloride Stopped (05/02/20 2205)  . vancomycin 750 mg (05/14/20 2349)     LOS: 18 days   Time spent: 26 min   Darliss Cheney, MD [facility]  05/15/2020, 10:24 AM   To contact the attending provider between 7A-7P or the covering provider during after hours 7P-7A, please log into the web site [email or web address].

## 2020-05-16 DIAGNOSIS — I2690 Septic pulmonary embolism without acute cor pulmonale: Secondary | ICD-10-CM

## 2020-05-16 DIAGNOSIS — I079 Rheumatic tricuspid valve disease, unspecified: Secondary | ICD-10-CM

## 2020-05-16 LAB — MAGNESIUM: Magnesium: 1.9 mg/dL (ref 1.7–2.4)

## 2020-05-16 MED ORDER — MAGNESIUM SULFATE 2 GM/50ML IV SOLN
2.0000 g | Freq: Once | INTRAVENOUS | Status: AC
  Administered 2020-05-16: 2 g via INTRAVENOUS
  Filled 2020-05-16: qty 50

## 2020-05-16 NOTE — Progress Notes (Signed)
Physical Therapy Treatment Patient Details Name: Candice Jordan MRN: [id number] DOB: 01-17-2001 Today's Date: 05/16/2020    History of Present Illness Patient is a 19 year old female presenting to [facility] ED on 5/25 with reported 1 week of shortness of breath. On arrival to ED CXR revealing of large right sided tension Pneumothorax. Chest Tube placed. 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 No longer requiring chest tube. History of MRSA Bacteremia/Endocarditis. COVID-19. Polysubstance Abuse.     PT Comments    Continuing work on functional mobility and activity tolerance;  Notable improvements in steadiness with transfers and gait, and able to incr amb distance; HR ranged 108-127bpm throughout walk; Good progress towards goals -- original goals set on 5/30 continue to be appropriate   Follow Up Recommendations  No PT follow up     Equipment Recommendations  None recommended by PT    Recommendations for Other Services       Precautions / Restrictions Precautions Precautions: Other (comment) Precaution Comments: sitter present due to IVC; watch HR with activity    Mobility  Bed Mobility Overal bed mobility: Independent                Transfers Overall transfer level: Modified independent               General transfer comment: Managing well with transfers in her room  Ambulation/Gait Ambulation/Gait assistance: Supervision Gait Distance (Feet): 800 Feet Assistive device: None Gait Pattern/deviations: WFL(Within Functional Limits)     General Gait Details: No scissoring noted today; HR incr to 127 observed maximum   Stairs             Wheelchair Mobility    Modified Rankin (Stroke Patients Only)       Balance Overall balance assessment: No apparent balance deficits (not formally assessed)                                          Cognition  Arousal/Alertness: Awake/alert Behavior During Therapy: Flat affect Overall Cognitive Status: Impaired/Different from baseline                           Safety/Judgement: Decreased awareness of safety;Decreased awareness of deficits Awareness: Intellectual          Exercises      General Comments General comments (skin integrity, edema, etc.): HR range 108-127 bpm with hallway amb; DOE 1/4      Pertinent Vitals/Pain Pain Assessment: No/denies pain    Home Living                      Prior Function            PT Goals (current goals can now be found in the care plan section) Acute Rehab PT Goals Patient Stated Goal: reports would like to go outside PT Goal Formulation: With patient Time For Goal Achievement: 05/29/20 Potential to Achieve Goals: Good Progress towards PT goals: Progressing toward goals (Goals set 5/30 continue to be appropriate)    Frequency    Min 1X/week      PT Plan Current plan remains appropriate    Co-evaluation              AM-PAC PT "6 Clicks" Mobility   Outcome Measure  Help needed turning from  your back to your side while in a flat bed without using bedrails?: None Help needed moving from lying on your back to sitting on the side of a flat bed without using bedrails?: None Help needed moving to and from a bed to a chair (including a wheelchair)?: None Help needed standing up from a chair using your arms (e.g., wheelchair or bedside chair)?: None Help needed to walk in hospital room?: None Help needed climbing 3-5 steps with a railing? : A Little 6 Click Score: 23    End of Session   Activity Tolerance: Patient tolerated treatment well Patient left: in bed;with call bell/phone within reach;with nursing/sitter in room Nurse Communication: Mobility status PT Visit Diagnosis: Unsteadiness on feet (R26.81);Other abnormalities of gait and mobility (R26.89);Muscle weakness (generalized) (M62.81)     Time:  3353-3174 PT Time Calculation (min) (ACUTE ONLY): 16 min  Charges:  $Gait Training: 8-22 mins                     Van Clines, [facility]  Acute [facility] Services Pager [phone] Office [phone]    Levi Aland 05/16/2020, 5:30 PM

## 2020-05-16 NOTE — Progress Notes (Signed)
PROGRESS NOTE    Candice Jordan  JYN:[id number] DOB: [birth year] DOA: 04/27/2020 PCP: System, Provider Not In   Brief Narrative:  19 year old female presents to [facility] ED on 5/25 with reported 1 week of shortness of breath. Patient was found at [address] with oxygen saturation 79%. Reports using Heroin last night. Diagnosed with MRSA Bacteremia/Endocarditis and COVID 03/08/2020 however has left AMA numerous times throughout the month of April. On arrival to ED. CXR revealing of large right sided tension Pneumothorax. Chest Tube placed. 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 She has been IVC'd. IVC was renewed on 05/05/2020.  The patient has had continual tachycardia since admission. Likely due to [place] secondary to chronic cocaine and heroin abuse. CTA chest has ruled out PE, but does demonstrate moderate bilateral pleural effusions.  I have discussed the pleural effusions with IR. It seems that they are small and the patient is unlikely to benefit from thoracentesis.   Assessment & Plan:   Active Problems:   MRSA bacteremia   Endocarditis of tricuspid valve   Spontaneous tension pneumothorax   AKI (acute kidney injury) ([place])   Sepsis without acute organ dysfunction (HCC)   Pneumothorax   Status post chest tube placement (HCC)   Polysubstance abuse (HCC)   Anemia   Hypokalemia   Hypomagnesemia   Tachycardia   Positive hepatitis C antibody test   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 Patient met criteria for sepsis on admission with tachypnea, tachycardia, leukocytosis, concern for untreated bacteremia. Repeat 2D  echo which was done showed worsening/severe tricuspid valvular regurgitation, tricuspid valve vegetation attached to posterior leaflet measuring 0.9 cm x 0.5 cm more apparent on this study the last study. Severely dilated right atrial size. Hyperdynamic right ventricular systolic function.CT surgery was consulted 04/28/2020 for possible valve repair however due to ongoing IV drug abuse patient at high risk for reinfection if she were to undergo valve replacement and recommending focusing on drug rehabilitation. Per CT surgery obtaining a TEE will not change surgical candidacy and recommending medical management for now. Repeat blood cultures from 04/29/2028 + for staph aureus. Repeat blood cultures drawn on 05/03/2020 which were negative.  ID following and patient remains on IV vancomycin with estimated end date of therapy planned for 06/14/2020 per ID.  Acute hypoxemic respiratory distress secondary to right-sided tensionpneumothorax status post chest tube placement: Chest tube now removed. 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 Chest tube removed 04/28/2020 per PCCM. Repeat chest x-ray done showed no recurrent pneumothorax.  Recent CT angiogram of the chest done on 05/07/2020 shows significant improvement in right pneumothorax. Pulmonary hygiene. Mucinex added due to ongoing cough. Place on Hycodan as needed. Supportive care.   Bilateral moderate pleural effusions. CTA chest has demonstrated bilateral moderate pleural effusions. Will monitor and consider thoracentesis, however, the patient's history of tension pneumothorax is concerning for risk of recurrence. previous hospitalist have discussed the patient with Dr. Larena Glassman. He states that in his estimation the pleural effusions are small and do not warrant intervention at this time.  Repeat chest x-ray from yesterday shows  possible bulla or small contained  pneumothorax but patient remains asymptomatic.  Tachycardia: Likely due to chronic IV abuse of heroin and cocaine and endocarditis. CTA chest negative for PE.  Increased carvedilol to 6.25 p.o. twice daily on 05/15/2020.  Tachycardia improved but is still heart rate over 100.  Blood pressure on the low normal side.  Hesitant to increase the dose further.  Monitor for now.  She has no symptoms.  Polysubstance abuse/heroin use: UDS noted at outside hospital to be positive for amphetamines. Patient noted to have multiple admissions for endocarditis, where she has left AMA . Patient seen by psychiatry on 04/27/2020 and is felt patient lacks capacity to make an informed medical decision regarding her medical care and disposition and recommending social work evaluation to contact next of kin no appropriate hospital administrator to help with decision-making process. Continue Suboxone. Was on clonidine taper/detox protocol. Patient wanted to leave AMA again (04/29/2020). Patient however did state she understands the risk of leaving AMA which include death. Was also felt by PCCM that patient lacked capacity poor insight into her current medical situation.Patient with poor insight into her current medical situation. Patient not thinking rationally. Patient has subsequently been IVC'd to finish out treatment for MRSA disseminated infection/endocarditis with worsening tricuspid regurgitation.Patient has been reassessed by psych who are in agreement that patient lacks capacity.   Anemiaof chronic disease: Anemia panel consistent with anemia of chronic disease. Status post transfusion 1 unit packed red blood cells. Hemoglobin 9.6 yesterday.  Hypomagnesemia/hypokalemia: Replace magnesium.  Recheck magnesium later.  Potassium normal.  Positive hep C antibody/transaminitis: HCV RNA quant of 2260. Outpatient follow-up with ID.   Back pain: Resolved according to patient.  Allergic rash/contact dermatitis:  Patient had complained of some rash and itching at her lower back on 05/11/2020.  I examined her using a female chaperone/patient's primary nurse.  She had some maculopapular rash but no open sores or any drainage.  This was likely contact dermatitis.  She is on hydrocortisone.  According to her, her itching has resolved.   DVT prophylaxis: SCD   Code Status: Full Code  Family Communication:  None present at bedside.  Plan of care discussed with patient in length and he verbalized understanding and agreed with it. Patient is from: Home Disposition Plan: Home Barriers to discharge: Need for continuation of IV antibiotics to treat endocarditis  Status is: Inpatient  Remains inpatient appropriate because:Inpatient level of care appropriate due to severity of illness   Dispo: The patient is from: Home              Anticipated d/c is to: Home              Anticipated d/c date is: > 3 days              Patient currently is not medically stable to d/c.        Estimated body mass index is 18.59 kg/m as calculated from the following:   Height as of this encounter: 5' 5" (1.651 m).   Weight as of this encounter: 50.7 kg.      Nutritional status:               Consultants:   ID, cardiothoracic surgery  Procedures:   None  Antimicrobials:  Anti-infectives (From admission, onward)   Start     Dose/Rate Route Frequency Ordered Stop   05/07/20 1430  vancomycin (VANCOREADY) IVPB 750 mg/150 mL     Discontinue  750 mg 150 mL/hr over 60 Minutes Intravenous Every 8 hours 05/07/20 1313     05/01/20 1430  vancomycin (VANCOCIN) IVPB 1000 mg/200 mL premix  Status:  Discontinued        1,000 mg 200 mL/hr over 60 Minutes Intravenous Every 8 hours 05/01/20 0946 05/07/20 1313   04/30/20 0600  vancomycin (VANCOCIN) IVPB 1000 mg/200 mL premix  Status:  Discontinued        1,000 mg 200 mL/hr over 60 Minutes Intravenous Every 8 hours 04/29/20 2318 05/01/20 0947   04/29/20 2330   vancomycin (VANCOREADY) IVPB 500 mg/100 mL  Status:  Discontinued        500 mg 100 mL/hr over 60 Minutes Intravenous Every 8 hours 04/29/20 1648 04/29/20 2315   04/27/20 1100  vancomycin (VANCOREADY) IVPB 500 mg/100 mL  Status:  Discontinued        500 mg 100 mL/hr over 60 Minutes Intravenous Every 8 hours 04/27/20 0210 04/29/20 1648   04/27/20 0300  vancomycin (VANCOCIN) IVPB 1000 mg/200 mL premix        1,000 mg 200 mL/hr over 60 Minutes Intravenous  Once 04/27/20 0203 04/27/20 0423   04/27/20 0300  piperacillin-tazobactam (ZOSYN) IVPB 3.375 g  Status:  Discontinued        3.375 g 12.5 mL/hr over 240 Minutes Intravenous Every 8 hours 04/27/20 0210 04/27/20 0856         Subjective: Patient seen and examined.  She had no complaints.  Objective: Vitals:   05/15/20 1005 05/15/20 2123 05/16/20 0552 05/16/20 1205  BP: 106/71 103/68 93/67 (P) 94/62  Pulse: (!) 102 (!) 118 (!) 108 (!) (P) 103  Resp: _0 (P) 18  Temp: 98.6 F (37 C) 99.8 F (37.7 C) 99.5 F (37.5 C) (P) 97.9 F (36.6 C)  TempSrc: Oral Oral Oral (P) Oral  SpO2: 98% 97% 97% (!) (P) 87%  Weight:      Height:        Intake/Output Summary (Last 24 hours) at 05/16/2020 1302 Last data filed at 05/16/2020 1240 Gross per 24 hour  Intake 1210 ml  Output --  Net 1210 ml   Filed Weights   05/07/20 0346 05/08/20 0625 05/09/20 1801  Weight: 49.9 kg 50.4 kg 50.7 kg    Examination:  General exam: Appears calm and comfortable  Respiratory system: Clear to auscultation. Respiratory effort normal. Cardiovascular system: S1 & S2 heard, RRR. No JVD, murmurs, rubs, gallops or clicks. No pedal edema. Gastrointestinal system: Abdomen is nondistended, soft and nontender. No organomegaly or masses felt. Normal bowel sounds heard. Central nervous system: Alert and oriented. No focal neurological deficits. Extremities: Symmetric 5 x 5 power. Psychiatry: Judgement and insight appear poor. Mood & affect flat.  Data  Reviewed: I have personally reviewed following labs and imaging studies  CBC: Recent Labs  Lab 05/10/20 0323 05/12/20 0623 05/13/20 0828 05/15/20 0858  WBC 10.6* 9.6 9.0 7.2  NEUTROABS  --  4.3 3.6 3.1  HGB 8.8* 9.5* 9.9* 9.6*  HCT 29.6* 32.4* 32.9* 31.9*  MCV 91.9 92.8 93.2 93.3  PLT 256 289 281 361   Basic Metabolic Panel: Recent Labs  Lab 05/10/20 0323 05/11/20 0907 05/12/20 0623 05/13/20 0828 05/15/20 0858  NA 134*  --  135 135 136  K 4.2  --  4.3 4.4 3.9  CL 101  --  102 103 100  CO2 25  --  _1 GLUCOSE 100*  --  93 93 131*  BUN  16  --  _0 CREATININE 0.54  --  0.57 0.51 0.63  CALCIUM 8.6*  --  9.0 9.0 9.0  MG  --  1.7 1.6* 1.8 1.5*   GFR: Estimated Creatinine Clearance: 89.8 mL/min (by C-G formula based on SCr of 0.63 mg/dL). Liver Function Tests: Recent Labs  Lab 05/13/20 0828  AST 138*  ALT 162*  ALKPHOS 133*  BILITOT 0.4  PROT 8.9*  ALBUMIN 2.6*   No results for input(s): LIPASE, AMYLASE in the last 168 hours. No results for input(s): AMMONIA in the last 168 hours. Coagulation Profile: No results for input(s): INR, PROTIME in the last 168 hours. Cardiac Enzymes: No results for input(s): CKTOTAL, CKMB, CKMBINDEX, TROPONINI in the last 168 hours. BNP (last 3 results) No results for input(s): PROBNP in the last 8760 hours. HbA1C: No results for input(s): HGBA1C in the last 72 hours. CBG: No results for input(s): GLUCAP in the last 168 hours. Lipid Profile: No results for input(s): CHOL, HDL, LDLCALC, TRIG, CHOLHDL, LDLDIRECT in the last 72 hours. Thyroid Function Tests: No results for input(s): TSH, T4TOTAL, FREET4, T3FREE, THYROIDAB in the last 72 hours. Anemia Panel: No results for input(s): VITAMINB12, FOLATE, FERRITIN, TIBC, IRON, RETICCTPCT in the last 72 hours. Sepsis Labs: No results for input(s): PROCALCITON, LATICACIDVEN in the last 168 hours.  No results found for this or any previous visit (from the past 240 hour(s)).     Radiology Studies: No results found.  Scheduled Meds: . buprenorphine-naloxone  1 tablet Sublingual BID  . carvedilol  6.25 mg Oral BID WC  . dextromethorphan-guaiFENesin  1 tablet Oral BID  . enoxaparin (LOVENOX) injection  40 mg Subcutaneous Q24H  . hydrocortisone cream   Topical BID  . lidocaine  1 patch Transdermal Q24H  . magnesium oxide  400 mg Oral BID  . QUEtiapine  50 mg Oral BID   Continuous Infusions: . sodium chloride Stopped (05/02/20 2205)  . vancomycin 750 mg (05/16/20 0816)     LOS: 19 days   Time spent: 25 min   Darliss Cheney, MD [facility]  05/16/2020, 1:02 PM   To contact the attending provider between 7A-7P or the covering provider during after hours 7P-7A, please log into the web site [email or web address].

## 2020-05-16 NOTE — TOC Progression Note (Signed)
Transition of Care [facility]) - Progression Note    Patient Details  Name: Candice Jordan MRN: [id number] Date of Birth: [birth year]  Transition of Care [facility]) CM/SW Contact  Terrilee Croak, Student-Social Work Phone Number: 05/16/2020, 10:55 AM  Clinical Narrative:    MSW Intern consulted by PT to discuss a tattoo that the pt has on her wrist. The tattoo is of a crown and concerns were expressed about whether or not human trafficking could be involved. Upon discussing with pt's care team, a social worker had already addressed this with her, and she states it is a matching tattoo with an ex boyfriend. It was further noted that since she is in a safe environment and IVC'd it is recommended that it not be addressed with her again unless she raises concerns. Social work will follow IVC update on the 15th and any further action needed.        Expected Discharge Plan and Services                                                 Social Determinants of Health (SDOH) Interventions    Readmission Risk Interventions No flowsheet data found.

## 2020-05-16 NOTE — Progress Notes (Signed)
Patient ID: Candice Jordan, female   DOB: September 13, 2001, 19 y.o.   MRN: [id number]         [facility] for Infectious Disease  Date of Admission:  04/27/2020           Day 21 vancomycin ASSESSMENT: She is improving on therapy for MRSA bacteremia complicated by tricuspid valve endocarditis septic pulmonary emboli causing cavitary pneumonia and empyema.  PLAN: 1. Continue vancomycin 2. I will follow-up next week  Active Problems:   MRSA bacteremia   Endocarditis of tricuspid valve   Spontaneous tension pneumothorax   AKI (acute kidney injury) (HCC)   Sepsis without acute organ dysfunction (HCC)   Pneumothorax   Status post chest tube placement (HCC)   Polysubstance abuse (HCC)   Anemia   Hypokalemia   Hypomagnesemia   Tachycardia   Positive hepatitis C antibody test   Scheduled Meds: . buprenorphine-naloxone  1 tablet Sublingual BID  . carvedilol  6.25 mg Oral BID WC  . dextromethorphan-guaiFENesin  1 tablet Oral BID  . enoxaparin (LOVENOX) injection  40 mg Subcutaneous Q24H  . hydrocortisone cream   Topical BID  . lidocaine  1 patch Transdermal Q24H  . magnesium oxide  400 mg Oral BID  . QUEtiapine  50 mg Oral BID   Continuous Infusions: . sodium chloride Stopped (05/02/20 2205)  . vancomycin 750 mg (05/16/20 0816)   PRN Meds:.sodium chloride, acetaminophen, albuterol, docusate sodium, haloperidol lactate, HYDROcodone-homatropine, lip balm, metoprolol tartrate, nitroGLYCERIN, ondansetron (ZOFRAN) IV, oxyCODONE, polyethylene glycol   SUBJECTIVE: She tells me that she is tired.  Review of Systems: Review of Systems  Constitutional: Negative for chills, diaphoresis and fever.  Respiratory: Negative for cough and shortness of breath.   Cardiovascular: Negative for chest pain.    No Known Allergies  OBJECTIVE: Vitals:   05/15/20 1005 05/15/20 2123 05/16/20 0552 05/16/20 1205  BP: 106/71 103/68 93/67 (P) 94/62  Pulse: (!) 102 (!) 118 (!) 108 (!) (P) 103    Resp: 20 18 18  (P) 18  Temp: 98.6 F (37 C) 99.8 F (37.7 C) 99.5 F (37.5 C) (P) 97.9 F (36.6 C)  TempSrc: Oral Oral Oral (P) Oral  SpO2: 98% 97% 97% (!) (P) 87%  Weight:      Height:       Body mass index is 18.59 kg/m.  Physical Exam Constitutional:      Comments: She is resting quietly in bed in a darkened room.  There is a sitter in the room with her.  She only gave me a couple of one-word answers.  Cardiovascular:     Rate and Rhythm: Normal rate and regular rhythm.     Heart sounds: No murmur heard.   Pulmonary:     Effort: Pulmonary effort is normal.     Breath sounds: Normal breath sounds.     Lab Results Lab Results  Component Value Date   WBC 7.2 05/15/2020   HGB 9.6 (L) 05/15/2020   HCT 31.9 (L) 05/15/2020   MCV 93.3 05/15/2020   PLT 214 05/15/2020    Lab Results  Component Value Date   CREATININE 0.63 05/15/2020   BUN 11 05/15/2020   NA 136 05/15/2020   K 3.9 05/15/2020   CL 100 05/15/2020   CO2 26 05/15/2020    Lab Results  Component Value Date   ALT 162 (H) 05/13/2020   AST 138 (H) 05/13/2020   ALKPHOS 133 (H) 05/13/2020   BILITOT 0.4 05/13/2020     Microbiology: No  results found for this or any previous visit (from the past 240 hour(s)).  Michel Bickers, MD [facility] for [facility] [place] [facility] [phone] pager   [phone] cell 05/16/2020, 3:31 PM

## 2020-05-17 MED ORDER — RIVAROXABAN 10 MG PO TABS
10.0000 mg | ORAL_TABLET | Freq: Every day | ORAL | Status: DC
  Administered 2020-05-17 – 2020-05-25 (×9): 10 mg via ORAL
  Filled 2020-05-17 (×10): qty 1

## 2020-05-17 NOTE — Progress Notes (Signed)
PROGRESS NOTE    Candice Jordan  JYN:[id number] DOB: [birth year] DOA: 04/27/2020 PCP: System, Provider Not In   Brief Narrative:  19 year old female presents to [facility] ED on 5/25 with reported 1 week of shortness of breath. Patient was found at [address] with oxygen saturation 79%. Reports using Heroin last night. Diagnosed with MRSA Bacteremia/Endocarditis and COVID 03/08/2020 however has left AMA numerous times throughout the month of April. On arrival to ED. CXR revealing of large right sided tension Pneumothorax. Chest Tube placed. 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 She has been IVC'd. IVC was renewed on 05/05/2020.  The patient has had continual tachycardia since admission. Likely due to [place] secondary to chronic cocaine and heroin abuse. CTA chest has ruled out PE, but does demonstrate moderate bilateral pleural effusions.  I have discussed the pleural effusions with IR. It seems that they are small and the patient is unlikely to benefit from thoracentesis.   Assessment & Plan:   Active Problems:   MRSA bacteremia   Endocarditis of tricuspid valve   Spontaneous tension pneumothorax   AKI (acute kidney injury) ([place])   Sepsis without acute organ dysfunction (HCC)   Pneumothorax   Status post chest tube placement (HCC)   Polysubstance abuse (HCC)   Anemia   Hypokalemia   Hypomagnesemia   Tachycardia   Positive hepatitis C antibody test   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 Patient met criteria for sepsis on admission with tachypnea, tachycardia, leukocytosis, concern for untreated bacteremia. Repeat 2D  echo which was done showed worsening/severe tricuspid valvular regurgitation, tricuspid valve vegetation attached to posterior leaflet measuring 0.9 cm x 0.5 cm more apparent on this study the last study. Severely dilated right atrial size. Hyperdynamic right ventricular systolic function.CT surgery was consulted 04/28/2020 for possible valve repair however due to ongoing IV drug abuse patient at high risk for reinfection if she were to undergo valve replacement and recommending focusing on drug rehabilitation. Per CT surgery obtaining a TEE will not change surgical candidacy and recommending medical management for now. Repeat blood cultures from 04/29/2028 + for staph aureus. Repeat blood cultures drawn on 05/03/2020 which were negative.  ID following and patient remains on IV vancomycin with estimated end date of therapy planned for 06/14/2020 per ID.  Acute hypoxemic respiratory distress secondary to right-sided tensionpneumothorax status post chest tube placement: Chest tube now removed. 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 Chest tube removed 04/28/2020 per PCCM. Repeat chest x-ray done showed no recurrent pneumothorax.  Recent CT angiogram of the chest done on 05/07/2020 shows significant improvement in right pneumothorax. Pulmonary hygiene. Mucinex added due to ongoing cough. Place on Hycodan as needed. Supportive care.   Bilateral moderate pleural effusions. CTA chest has demonstrated bilateral moderate pleural effusions. Will monitor and consider thoracentesis, however, the patient's history of tension pneumothorax is concerning for risk of recurrence. previous hospitalist have discussed the patient with Dr. Larena Glassman. He states that in his estimation the pleural effusions are small and do not warrant intervention at this time.  Repeat chest x-ray from yesterday shows  possible bulla or small contained  pneumothorax but patient remains asymptomatic.  Tachycardia: Likely due to chronic IV abuse of heroin and cocaine and endocarditis. CTA chest negative for PE.  Increased carvedilol to 6.25 p.o. twice daily on 05/15/2020. Tachycardia improving slowly.  Continue current management.  Polysubstance abuse/heroin use: UDS noted at outside hospital to be positive for amphetamines. Patient noted to have multiple admissions for endocarditis, where she has left AMA . Patient seen by psychiatry on 04/27/2020 and is felt patient lacks capacity to make an informed medical decision regarding her medical care and disposition and recommending social work evaluation to contact next of kin no appropriate hospital administrator to help with decision-making process. Continue Suboxone. Was on clonidine taper/detox protocol. Patient wanted to leave AMA again (04/29/2020). Patient however did state she understands the risk of leaving AMA which include death. Was also felt by PCCM that patient lacked capacity poor insight into her current medical situation.Patient with poor insight into her current medical situation. Patient not thinking rationally. Patient has subsequently been IVC'd to finish out treatment for MRSA disseminated infection/endocarditis with worsening tricuspid regurgitation.Patient has been reassessed by psych who are in agreement that patient lacks capacity.   Anemiaof chronic disease: Anemia panel consistent with anemia of chronic disease. Status post transfusion 1 unit packed red blood cells. Hemoglobin 9.6 yesterday.  Hypomagnesemia/hypokalemia: Resolved.  Positive hep C antibody/transaminitis: HCV RNA quant of 2260. Outpatient follow-up with ID.   Back pain: Resolved according to patient.  Allergic rash/contact dermatitis: Patient had complained of some rash and itching at her lower back on 05/11/2020.  I examined her using a female chaperone/patient's primary nurse.  She had some maculopapular  rash but no open sores or any drainage.  This was likely contact dermatitis.  She was started on hydrocortisone.  According to her, her rash and itching is improved.  She would not allow me to examine.  We will stop hydrocortisone as her symptoms have resolved.  DVT prophylaxis: SCD   Code Status: Full Code  Family Communication:  None present at bedside.  Plan of care discussed with patient in length and he verbalized understanding and agreed with it. Patient is from: Home Disposition Plan: Home Barriers to discharge: Need for continuation of IV antibiotics to treat endocarditis  Status is: Inpatient  Remains inpatient appropriate because:Inpatient level of care appropriate due to severity of illness   Dispo: The patient is from: Home              Anticipated d/c is to: Home              Anticipated d/c date is: > 3 days              Patient currently is not medically stable to d/c.        Estimated body mass index is 18.59 kg/m as calculated from the following:   Height as of this encounter: _0  (1.651 m).   Weight as of this encounter: 50.7 kg.      Nutritional status:               Consultants:   ID, cardiothoracic surgery  Procedures:   None  Antimicrobials:  Anti-infectives (From admission, onward)   Start     Dose/Rate Route Frequency Ordered Stop   05/07/20 1430  vancomycin (VANCOREADY) IVPB 750 mg/150 mL     Discontinue     750 mg 150 mL/hr over 60 Minutes Intravenous Every 8 hours 05/07/20 1313  05/01/20 1430  vancomycin (VANCOCIN) IVPB 1000 mg/200 mL premix  Status:  Discontinued        1,000 mg 200 mL/hr over 60 Minutes Intravenous Every 8 hours 05/01/20 0946 05/07/20 1313   04/30/20 0600  vancomycin (VANCOCIN) IVPB 1000 mg/200 mL premix  Status:  Discontinued        1,000 mg 200 mL/hr over 60 Minutes Intravenous Every 8 hours 04/29/20 2318 05/01/20 0947   04/29/20 2330  vancomycin (VANCOREADY) IVPB 500 mg/100 mL  Status:  Discontinued         500 mg 100 mL/hr over 60 Minutes Intravenous Every 8 hours 04/29/20 1648 04/29/20 2315   04/27/20 1100  vancomycin (VANCOREADY) IVPB 500 mg/100 mL  Status:  Discontinued        500 mg 100 mL/hr over 60 Minutes Intravenous Every 8 hours 04/27/20 0210 04/29/20 1648   04/27/20 0300  vancomycin (VANCOCIN) IVPB 1000 mg/200 mL premix        1,000 mg 200 mL/hr over 60 Minutes Intravenous  Once 04/27/20 0203 04/27/20 0423   04/27/20 0300  piperacillin-tazobactam (ZOSYN) IVPB 3.375 g  Status:  Discontinued        3.375 g 12.5 mL/hr over 240 Minutes Intravenous Every 8 hours 04/27/20 0210 04/27/20 0856         Subjective: Patient seen and examined.  No change.  No complaints.  Objective: Vitals:   05/17/20 0021 05/17/20 0543 05/17/20 1016 05/17/20 1016  BP: 105/71 102/74  90/63  Pulse: 100 (!) 101  (!) 108  Resp: _0 Temp: 98.7 F (37.1 C) 98.9 F (37.2 C) 98.9 F (37.2 C) 98 F (36.7 C)  TempSrc: Oral Oral Oral   SpO2: 98% 99%  98%  Weight:      Height:        Intake/Output Summary (Last 24 hours) at 05/17/2020 1240 Last data filed at 05/17/2020 0900 Gross per 24 hour  Intake 900 ml  Output 0 ml  Net 900 ml   Filed Weights   05/07/20 0346 05/08/20 0625 05/09/20 1801  Weight: 49.9 kg 50.4 kg 50.7 kg    Examination:  General exam: Appears calm and comfortable  Respiratory system: Clear to auscultation. Respiratory effort normal. Cardiovascular system: S1 & S2 heard, RRR. No JVD, murmurs, rubs, gallops or clicks. No pedal edema. Gastrointestinal system: Abdomen is nondistended, soft and nontender. No organomegaly or masses felt. Normal bowel sounds heard. Central nervous system: Alert and oriented. No focal neurological deficits. Extremities: Symmetric 5 x 5 power. Skin: No rashes, lesions or ulcers.  Psychiatry: Judgement and insight appear poor. Mood & affect flat  Data Reviewed: I have personally reviewed following labs and imaging  studies  CBC: Recent Labs  Lab 05/12/20 0623 05/13/20 0828 05/15/20 0858  WBC 9.6 9.0 7.2  NEUTROABS 4.3 3.6 3.1  HGB 9.5* 9.9* 9.6*  HCT 32.4* 32.9* 31.9*  MCV 92.8 93.2 93.3  PLT 289 281 938   Basic Metabolic Panel: Recent Labs  Lab 05/11/20 0907 05/12/20 0623 05/13/20 0828 05/15/20 0858 05/16/20 1325  NA  --  135 135 136  --   K  --  4.3 4.4 3.9  --   CL  --  102 103 100  --   CO2  --  _1 --   GLUCOSE  --  93 93 131*  --   BUN  --  _2 --   CREATININE  --  0.57 0.51 0.63  --  CALCIUM  --  9.0 9.0 9.0  --   MG 1.7 1.6* 1.8 1.5* 1.9   GFR: Estimated Creatinine Clearance: 89.8 mL/min (by C-G formula based on SCr of 0.63 mg/dL). Liver Function Tests: Recent Labs  Lab 05/13/20 0828  AST 138*  ALT 162*  ALKPHOS 133*  BILITOT 0.4  PROT 8.9*  ALBUMIN 2.6*   No results for input(s): LIPASE, AMYLASE in the last 168 hours. No results for input(s): AMMONIA in the last 168 hours. Coagulation Profile: No results for input(s): INR, PROTIME in the last 168 hours. Cardiac Enzymes: No results for input(s): CKTOTAL, CKMB, CKMBINDEX, TROPONINI in the last 168 hours. BNP (last 3 results) No results for input(s): PROBNP in the last 8760 hours. HbA1C: No results for input(s): HGBA1C in the last 72 hours. CBG: No results for input(s): GLUCAP in the last 168 hours. Lipid Profile: No results for input(s): CHOL, HDL, LDLCALC, TRIG, CHOLHDL, LDLDIRECT in the last 72 hours. Thyroid Function Tests: No results for input(s): TSH, T4TOTAL, FREET4, T3FREE, THYROIDAB in the last 72 hours. Anemia Panel: No results for input(s): VITAMINB12, FOLATE, FERRITIN, TIBC, IRON, RETICCTPCT in the last 72 hours. Sepsis Labs: No results for input(s): PROCALCITON, LATICACIDVEN in the last 168 hours.  No results found for this or any previous visit (from the past 240 hour(s)).    Radiology Studies: No results found.  Scheduled Meds: . buprenorphine-naloxone  1 tablet  Sublingual BID  . carvedilol  6.25 mg Oral BID WC  . dextromethorphan-guaiFENesin  1 tablet Oral BID  . enoxaparin (LOVENOX) injection  40 mg Subcutaneous Q24H  . hydrocortisone cream   Topical BID  . lidocaine  1 patch Transdermal Q24H  . magnesium oxide  400 mg Oral BID  . QUEtiapine  50 mg Oral BID   Continuous Infusions: . sodium chloride Stopped (05/02/20 2205)  . vancomycin 750 mg (05/17/20 0806)     LOS: 20 days   Time spent: 26 min   Darliss Cheney, MD [facility]  05/17/2020, 12:40 PM   To contact the attending provider between 7A-7P or the covering provider during after hours 7P-7A, please log into the web site [email or web address].

## 2020-05-17 NOTE — Progress Notes (Signed)
Patient continues to be IVC'd . Patient's  IVC paperwork updated, faxed to the magistrates office and and will be served by police officers and placed in patient's chart.  IVC paperwork will need to be updated again on 05/24/20

## 2020-05-17 NOTE — Progress Notes (Signed)
Pharmacy Antibiotic Note  Candice Jordan is a 19 y.o. female admitted on 04/27/2020 with MRSA bacteremia/TV endocarditis and septic emboli.   Scr stable at 0.63. Afebrile. WBC stable at 7.2. Vancomycin was reduced to 750 mg every 8 hours on 6/5.  Last Trough was 16 mcg/ml. Level was drawn approximately 50 minutes early. True trough is likely just slightly below 15, but not clinically significant enough to change dose. Vancomycin is at steadystate. Plan treatment through 7/13  Plan: Continue Vancomycin to 750 mg IV every 8 hours.   Plan repeat vanc trough once weekly  Will follow renal function and clinical progress   Height: 5\' 5"  (165.1 cm) Weight: 50.7 kg (111 lb 11.2 oz) IBW/kg (Calculated) : 57  Temp (24hrs), Avg:98.4 F (36.9 C), Min:97.9 F (36.6 C), Max:98.9 F (37.2 C)  Recent Labs  Lab 05/10/20 1527 05/12/20 0623 05/13/20 0828 05/14/20 0650 05/15/20 0858  WBC  --  9.6 9.0  --  7.2  CREATININE  --  0.57 0.51  --  0.63  VANCOTROUGH 14*  --   --  16  --     Estimated Creatinine Clearance: 89.8 mL/min (by C-G formula based on SCr of 0.63 mg/dL).    No Known Allergies  Antimicrobials this admission: Vancomycin 5/26>> Zosyn 5/25>>5/26  Dose adjustments this admission: -5/28: VT 8 - change from 500 mg IV every 8 hours to 1000 mg IV every 8 hours -5/30: VT 16 - continue same regimen -6/4: VT 29 - collected during infusion, ordered another VT for 6/5 -6/5: VT 22 - Change from 1000 mg IV Q8H to 750 mg IV Q8H -6/8: VT 14-continue 750mg  IV q8h for now -6/12 VT 16-continue 750 mg IV q8h   Microbiology results: 5/25 Bcx (at OSH): GPC in clusters (2/2) -MRSA  5/26BCx: staph aureus 2/2; BCID w/ MRSA 1/4 5/26UCx: neg 5/26MRSA PCR: pos  5/28 Bcx: staph aurus 2/2 6/1 Bcx: NGTD  Anarie Kalish A. 6/28, PharmD, BCPS, FNKF Clinical Pharmacist [place] Please utilize Amion for appropriate phone number to reach the unit pharmacist [facility])   05/17/2020 9:28  AM

## 2020-05-18 DIAGNOSIS — F1990 Other psychoactive substance use, unspecified, uncomplicated: Secondary | ICD-10-CM

## 2020-05-18 LAB — BASIC METABOLIC PANEL
Anion gap: 11 (ref 5–15)
BUN: 10 mg/dL (ref 6–20)
CO2: 23 mmol/L (ref 22–32)
Calcium: 8.8 mg/dL — ABNORMAL LOW (ref 8.9–10.3)
Chloride: 101 mmol/L (ref 98–111)
Creatinine, Ser: 0.56 mg/dL (ref 0.44–1.00)
GFR calc Af Amer: >60 mL/min (ref >60)
GFR calc non Af Amer: >60 mL/min (ref >60)
Glucose, Bld: 113 mg/dL — ABNORMAL HIGH (ref 70–99)
Potassium: 3.8 mmol/L (ref 3.5–5.1)
Sodium: 135 mmol/L (ref 135–145)

## 2020-05-18 LAB — CBC WITH DIFFERENTIAL/PLATELET
Abs Immature Granulocytes: 0.03 10*3/uL (ref 0.00–0.07)
Basophils Absolute: 0 10*3/uL (ref 0.0–0.1)
Basophils Relative: 1 %
Eosinophils Absolute: 0.1 10*3/uL (ref 0.0–0.5)
Eosinophils Relative: 2 %
HCT: 31.5 % — ABNORMAL LOW (ref 36.0–46.0)
Hemoglobin: 9.4 g/dL — ABNORMAL LOW (ref 12.0–15.0)
Immature Granulocytes: 0 %
Lymphocytes Relative: 50 %
Lymphs Abs: 3.8 10*3/uL (ref 0.7–4.0)
MCH: 27.5 pg (ref 26.0–34.0)
MCHC: 29.8 g/dL — ABNORMAL LOW (ref 30.0–36.0)
MCV: 92.1 fL (ref 80.0–100.0)
Monocytes Absolute: 0.6 10*3/uL (ref 0.1–1.0)
Monocytes Relative: 8 %
Neutro Abs: 3 10*3/uL (ref 1.7–7.7)
Neutrophils Relative %: 39 %
Platelets: 213 10*3/uL (ref 150–400)
RBC: 3.42 MIL/uL — ABNORMAL LOW (ref 3.87–5.11)
RDW: 17.7 % — ABNORMAL HIGH (ref 11.5–15.5)
WBC: 7.5 10*3/uL (ref 4.0–10.5)
nRBC: 0 % (ref 0.0–0.2)

## 2020-05-18 LAB — MAGNESIUM: Magnesium: 1.5 mg/dL — ABNORMAL LOW (ref 1.7–2.4)

## 2020-05-18 MED ORDER — MAGNESIUM SULFATE 2 GM/50ML IV SOLN
2.0000 g | Freq: Once | INTRAVENOUS | Status: AC
  Administered 2020-05-18: 2 g via INTRAVENOUS
  Filled 2020-05-18: qty 50

## 2020-05-18 MED ORDER — ORITAVANCIN DIPHOSPHATE 400 MG IV SOLR: 1200.0000 mg | Freq: Once | INTRAVENOUS | Status: DC

## 2020-05-18 MED ORDER — ORITAVANCIN DIPHOSPHATE 400 MG IV SOLR
1200.0000 mg | Freq: Once | INTRAVENOUS | Status: AC
  Administered 2020-05-25: 1200 mg via INTRAVENOUS
  Filled 2020-05-18: qty 120

## 2020-05-18 NOTE — Progress Notes (Signed)
PROGRESS NOTE    Candice Jordan  JYN:[id number] DOB: [birth year] DOA: 04/27/2020 PCP: System, Provider Not In   Brief Narrative:  19 year old female presents to [facility] ED on 5/25 with reported 1 week of shortness of breath. Patient was found at [address] with oxygen saturation 79%. Reports using Heroin last night. Diagnosed with MRSA Bacteremia/Endocarditis and COVID 03/08/2020 however has left AMA numerous times throughout the month of April. On arrival to ED. CXR revealing of large right sided tension Pneumothorax. Chest Tube placed. 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 She has been IVC'd. IVC was renewed on 05/05/2020.  The patient has had continual tachycardia since admission. Likely due to [place] secondary to chronic cocaine and heroin abuse. CTA chest has ruled out PE, but does demonstrate moderate bilateral pleural effusions.  I have discussed the pleural effusions with IR. It seems that they are small and the patient is unlikely to benefit from thoracentesis.   Assessment & Plan:   Active Problems:   MRSA bacteremia   Endocarditis of tricuspid valve   Spontaneous tension pneumothorax   AKI (acute kidney injury) ([place])   Sepsis without acute organ dysfunction (HCC)   Pneumothorax   Status post chest tube placement (HCC)   Polysubstance abuse (HCC)   Anemia   Hypokalemia   Hypomagnesemia   Tachycardia   Positive hepatitis C antibody test   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 Patient met criteria for sepsis on admission with tachypnea, tachycardia, leukocytosis, concern for untreated bacteremia. Repeat 2D  echo which was done showed worsening/severe tricuspid valvular regurgitation, tricuspid valve vegetation attached to posterior leaflet measuring 0.9 cm x 0.5 cm more apparent on this study the last study. Severely dilated right atrial size. Hyperdynamic right ventricular systolic function.CT surgery was consulted 04/28/2020 for possible valve repair however due to ongoing IV drug abuse patient at high risk for reinfection if she were to undergo valve replacement and recommending focusing on drug rehabilitation. Per CT surgery obtaining a TEE will not change surgical candidacy and recommending medical management for now. Repeat blood cultures from 04/29/2028 + for staph aureus. Repeat blood cultures drawn on 05/03/2020 which were negative.  ID following and patient remains on IV vancomycin with estimated end date of therapy planned for 06/14/2020 per ID.  Acute hypoxemic respiratory distress secondary to right-sided tensionpneumothorax status post chest tube placement: Chest tube now removed. 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 Chest tube removed 04/28/2020 per PCCM. Repeat chest x-ray done showed no recurrent pneumothorax.  Recent CT angiogram of the chest done on 05/07/2020 shows significant improvement in right pneumothorax. Pulmonary hygiene. Mucinex added due to ongoing cough. Place on Hycodan as needed. Supportive care.   Bilateral moderate pleural effusions. CTA chest has demonstrated bilateral moderate pleural effusions. Will monitor and consider thoracentesis, however, the patient's history of tension pneumothorax is concerning for risk of recurrence. previous hospitalist have discussed the patient with Dr. Larena Glassman. He states that in his estimation the pleural effusions are small and do not warrant intervention at this time.  Repeat chest x-ray from yesterday shows  possible bulla or small contained  pneumothorax but patient remains asymptomatic.  Tachycardia: Likely due to chronic IV abuse of heroin and cocaine and endocarditis. CTA chest negative for PE.  Increased carvedilol to 6.25 p.o. twice daily on 05/15/2020. Tachycardia improving slowly.  Continue current management.  Polysubstance abuse/heroin use: UDS noted at outside hospital to be positive for amphetamines. Patient noted to have multiple admissions for endocarditis, where she has left AMA . Patient seen by psychiatry on 04/27/2020 and is felt patient lacks capacity to make an informed medical decision regarding her medical care and disposition and recommending social work evaluation to contact next of kin no appropriate hospital administrator to help with decision-making process. Continue Suboxone. Was on clonidine taper/detox protocol. Patient wanted to leave AMA again (04/29/2020). Patient however did state she understands the risk of leaving AMA which include death. Was also felt by PCCM that patient lacked capacity poor insight into her current medical situation.Patient with poor insight into her current medical situation. Patient not thinking rationally. Patient has subsequently been IVC'd to finish out treatment for MRSA disseminated infection/endocarditis with worsening tricuspid regurgitation.Patient has been reassessed by psych who are in agreement that patient lacks capacity.   Anemiaof chronic disease: Anemia panel consistent with anemia of chronic disease. Status post transfusion 1 unit packed red blood cells. Hemoglobin 9.6 yesterday.  Hypomagnesemia/hypokalemia: Hypokalemia resolved but she still has hypomagnesemia.  She is on oral magnesium.  We will provide her some IV magnesium today.  Positive hep C antibody/transaminitis: HCV RNA quant of 2260. Outpatient follow-up with ID.   Back pain: Resolved according to patient.  Allergic rash/contact dermatitis in lower back: Resolved.  DVT prophylaxis: SCD   Code  Status: Full Code  Family Communication:  None present at bedside.  Plan of care discussed with patient in length and he verbalized understanding and agreed with it. Patient is from: Home Disposition Plan: Home Barriers to discharge: Need for continuation of IV antibiotics to treat endocarditis  Status is: Inpatient  Remains inpatient appropriate because:Inpatient level of care appropriate due to severity of illness   Dispo: The patient is from: Home              Anticipated d/c is to: Home              Anticipated d/c date is: > 3 days              Patient currently is not medically stable to d/c.        Estimated body mass index is 19.22 kg/m as calculated from the following:   Height as of this encounter: 5\' 5"  (1.651 m).   Weight as of this encounter: 52.4 kg.      Nutritional status:               Consultants:   ID, cardiothoracic surgery  Procedures:   None  Antimicrobials:  Anti-infectives (From admission, onward)   Start     Dose/Rate Route Frequency Ordered Stop   06/01/20 1000  Oritavancin Diphosphate (ORBACTIV) 1,200 mg in dextrose 5 % IVPB  Status:  Discontinued        1,200 mg 333.3 mL/hr over 180 Minutes Intravenous Once 05/18/20 1040 05/18/20 1046   05/31/20 1330  Oritavancin Diphosphate (ORBACTIV) 1,200 mg in dextrose 5 % IVPB     Discontinue     1,200 mg 333.3 mL/hr over 180 Minutes Intravenous Once 05/18/20 1046     05/07/20 1430  vancomycin (VANCOREADY) IVPB 750 mg/150 mL  Discontinue     750 mg 150 mL/hr over 60 Minutes Intravenous Every 8 hours 05/07/20 1313 05/31/20 1200   05/01/20 1430  vancomycin (VANCOCIN) IVPB 1000 mg/200 mL premix  Status:  Discontinued        1,000 mg 200 mL/hr over 60 Minutes Intravenous Every 8 hours 05/01/20 0946 05/07/20 1313   04/30/20 0600  vancomycin (VANCOCIN) IVPB 1000 mg/200 mL premix  Status:  Discontinued        1,000 mg 200 mL/hr over 60 Minutes Intravenous Every 8 hours 04/29/20 2318  05/01/20 0947   04/29/20 2330  vancomycin (VANCOREADY) IVPB 500 mg/100 mL  Status:  Discontinued        500 mg 100 mL/hr over 60 Minutes Intravenous Every 8 hours 04/29/20 1648 04/29/20 2315   04/27/20 1100  vancomycin (VANCOREADY) IVPB 500 mg/100 mL  Status:  Discontinued        500 mg 100 mL/hr over 60 Minutes Intravenous Every 8 hours 04/27/20 0210 04/29/20 1648   04/27/20 0300  vancomycin (VANCOCIN) IVPB 1000 mg/200 mL premix        1,000 mg 200 mL/hr over 60 Minutes Intravenous  Once 04/27/20 0203 04/27/20 0423   04/27/20 0300  piperacillin-tazobactam (ZOSYN) IVPB 3.375 g  Status:  Discontinued        3.375 g 12.5 mL/hr over 240 Minutes Intravenous Every 8 hours 04/27/20 0210 04/27/20 0856         Subjective: Seen and examined.  No complaints.  Back pain and itching resolved.  She was asking when she will be discharged.  Objective: Vitals:   05/17/20 1847 05/17/20 2024 05/17/20 2038 05/18/20 0500  BP:  103/68 102/68 101/67  Pulse:  (!) 107 90 100  Resp:   16 16  Temp: 99.7 F (37.6 C) 98.9 F (37.2 C) 98.4 F (36.9 C) 98.6 F (37 C)  TempSrc: Oral  Oral Oral  SpO2:  98% 98% 99%  Weight:    52.4 kg  Height:    '5\' 5"'$  (1.651 m)    Intake/Output Summary (Last 24 hours) at 05/18/2020 1215 Last data filed at 05/18/2020 0800 Gross per 24 hour  Intake 1330 ml  Output 2 ml  Net 1328 ml   Filed Weights   05/08/20 0625 05/09/20 1801 05/18/20 0500  Weight: 50.4 kg 50.7 kg 52.4 kg    Examination:  General exam: Appears calm and comfortable  Respiratory system: Clear to auscultation. Respiratory effort normal. Cardiovascular system: S1 & S2 heard, RRR. No JVD, murmurs, rubs, gallops or clicks. No pedal edema. Gastrointestinal system: Abdomen is nondistended, soft and nontender. No organomegaly or masses felt. Normal bowel sounds heard. Central nervous system: Alert and oriented. No focal neurological deficits. Psychiatry: Judgement and insight appear poor. Mood & affect  flat   Data Reviewed: I have personally reviewed following labs and imaging studies  CBC: Recent Labs  Lab 05/12/20 0623 05/13/20 0828 05/15/20 0858 05/18/20 0615  WBC 9.6 9.0 7.2 7.5  NEUTROABS 4.3 3.6 3.1 3.0  HGB 9.5* 9.9* 9.6* 9.4*  HCT 32.4* 32.9* 31.9* 31.5*  MCV 92.8 93.2 93.3 92.1  PLT 289 281 214 224   Basic Metabolic Panel: Recent Labs  Lab 05/12/20 0623 05/13/20 0828 05/15/20 0858 05/16/20 1325 05/18/20 0615  NA 135 135 136  --  135  K 4.3 4.4 3.9  --  3.8  CL 102 103 100  --  101  CO2 '24 25 26  '$ --  23  GLUCOSE 93 93 131*  --  113*  BUN '17 15 11  '$ --  10  CREATININE 0.57 0.51 0.63  --  0.56  CALCIUM 9.0 9.0 9.0  --  8.8*  MG 1.6* 1.8 1.5* 1.9 1.5*   GFR: Estimated Creatinine Clearance: 92.8 mL/min (by C-G formula based on SCr of 0.56 mg/dL). Liver Function Tests: Recent Labs  Lab 05/13/20 0828  AST 138*  ALT 162*  ALKPHOS 133*  BILITOT 0.4  PROT 8.9*  ALBUMIN 2.6*   No results for input(s): LIPASE, AMYLASE in the last 168 hours. No results for input(s): AMMONIA in the last 168 hours. Coagulation Profile: No results for input(s): INR, PROTIME in the last 168 hours. Cardiac Enzymes: No results for input(s): CKTOTAL, CKMB, CKMBINDEX, TROPONINI in the last 168 hours. BNP (last 3 results) No results for input(s): PROBNP in the last 8760 hours. HbA1C: No results for input(s): HGBA1C in the last 72 hours. CBG: No results for input(s): GLUCAP in the last 168 hours. Lipid Profile: No results for input(s): CHOL, HDL, LDLCALC, TRIG, CHOLHDL, LDLDIRECT in the last 72 hours. Thyroid Function Tests: No results for input(s): TSH, T4TOTAL, FREET4, T3FREE, THYROIDAB in the last 72 hours. Anemia Panel: No results for input(s): VITAMINB12, FOLATE, FERRITIN, TIBC, IRON, RETICCTPCT in the last 72 hours. Sepsis Labs: No results for input(s): PROCALCITON, LATICACIDVEN in the last 168 hours.  No results found for this or any previous visit (from the past 240  hour(s)).    Radiology Studies: No results found.  Scheduled Meds: . buprenorphine-naloxone  1 tablet Sublingual BID  . carvedilol  6.25 mg Oral BID WC  . dextromethorphan-guaiFENesin  1 tablet Oral BID  . lidocaine  1 patch Transdermal Q24H  . magnesium oxide  400 mg Oral BID  . QUEtiapine  50 mg Oral BID  . rivaroxaban  10 mg Oral Q supper   Continuous Infusions: . sodium chloride Stopped (05/02/20 2205)  . magnesium sulfate bolus IVPB    . [START ON 05/31/2020] oritavancin (ORBACTIV) IVPB    . vancomycin 750 mg (05/18/20 0819)     LOS: 21 days   Time spent: 25 min   Darliss Cheney, MD [facility]  05/18/2020, 12:15 PM   To contact the attending provider between 7A-7P or the covering provider during after hours 7P-7A, please log into the web site [email or web address].

## 2020-05-18 NOTE — Progress Notes (Addendum)
[facility] Infectious Disease  Date of Admission:  04/27/2020      Total days of antibiotics 23 vancomycin           ASSESSMENT: Candice Jordan is a 19 y.o. female with MRSA bacteremia complicated by tricuspid valve endocarditis. She has severe tricuspid regurgitation based on most recent echo 5/26. She does not seem to have HF symptoms at least at rest; I don't suspect she is moving around much outside of her room. She may ultimately require valve replacement in the future if she can durably go through drug rehab.  Will treat her with IV vancomycin through June 23rd and plan for one dose of long acting Oritavancin to complete 6 weeks of therapy - she will receive this June 23rd and can be discharged after that as long as her medical stay does not become more complicated.    Regarding opioid dependence, she has no good plan to manage it at this point; I worry greatly about relapse and encouraged her to think about formal rehab program given high risk of relapse and further infection related to injection use.    She had a relatively low level of Hepatitis C RNA 2 months ago - unclear if she is newly infected vs clearing.  Will help arrange follow up outpatient to monitor and determine treatment timing. She was unaware she has hepatitis C infection, genotype 3. We discussed treatment outpatient when she is ready with [place] x 8 weeks; likely she has had infection at most a few years and no permanent damage due to this.    Will sign off - happy to see her back if her condition changes or there are questions. Will arrange afternoon follow up with ID clinic a few weeks later to follow up on MRSA endocarditis and Hepatitis C.    PLAN: 1. Vancomycin through June 23rd 2. Continue 2x weekly BMP 3. Continue weekly CBC with differential, vancomycin AUC monitoring per pharmacy 4. Oritavancin to be dosed June 29th then she can be discharged after infusion - I explained the  infusion is 3 hours long  5. Hep c treatment follow up outpatient  6. She needs a better plan for drug rehab 7. Follow up arranged with Dr. Megan Salon July 14th @ 3:30 pm     Principal Problem:   Endocarditis of tricuspid valve Active Problems:   MRSA bacteremia   Spontaneous tension pneumothorax   AKI (acute kidney injury) ([place])   Sepsis without acute organ dysfunction (HCC)   Pneumothorax   Status post chest tube placement (HCC)   Polysubstance abuse (HCC)   Anemia   Hypokalemia   Hypomagnesemia   Tachycardia   Positive hepatitis C antibody test   Injection of illicit drug within last 12 months   . buprenorphine-naloxone  1 tablet Sublingual BID  . carvedilol  6.25 mg Oral BID WC  . dextromethorphan-guaiFENesin  1 tablet Oral BID  . lidocaine  1 patch Transdermal Q24H  . magnesium oxide  400 mg Oral BID  . QUEtiapine  50 mg Oral BID  . rivaroxaban  10 mg Oral Q supper    SUBJECTIVE: Candice Jordan does not have much to say today. She does not have any new complaints or concerns. Breathing well. Would like new linens.   Planning on discharging home to her grandmother's home in [place]. She does not feel like she needs drug rehab at this time to help get off drugs. She has never really  tried in the past. She plans on getting a job after discharge so she can distract herself.    Review of Systems: Review of Systems  Constitutional: Negative for chills, fever, malaise/fatigue and weight loss.  Respiratory: Negative for cough and sputum production.   Cardiovascular: Negative for chest pain and leg swelling.  Gastrointestinal: Negative for abdominal pain, diarrhea and vomiting.  Genitourinary: Negative for dysuria and flank pain.  Musculoskeletal: Negative for joint pain, myalgias and neck pain.  Skin: Negative for rash.  Neurological: Negative for dizziness, tingling and headaches.  Psychiatric/Behavioral: Negative for depression and substance abuse.    No Known  Allergies  OBJECTIVE: Vitals:   05/17/20 1847 05/17/20 2024 05/17/20 2038 05/18/20 0500  BP:  103/68 102/68 101/67  Pulse:  (!) 107 90 100  Resp:   16 16  Temp: 99.7 F (37.6 C) 98.9 F (37.2 C) 98.4 F (36.9 C) 98.6 F (37 C)  TempSrc: Oral  Oral Oral  SpO2:  98% 98% 99%  Weight:    52.4 kg  Height:    5\' 5"  (1.651 m)   Body mass index is 19.22 kg/m.  Physical Exam Vitals reviewed.  Constitutional:      Appearance: She is well-developed.     Comments: Resting in bed comfortably.   HENT:     Mouth/Throat:     Mouth: No oral lesions.     Dentition: Normal dentition. No dental abscesses.     Pharynx: No oropharyngeal exudate.  Neck:     Comments: +jvp noted  Cardiovascular:     Rate and Rhythm: Normal rate and regular rhythm.     Heart sounds: Murmur (systolic murmur RUSB ) heard.   Pulmonary:     Effort: Pulmonary effort is normal.     Breath sounds: Normal breath sounds.  Abdominal:     General: There is no distension.     Palpations: Abdomen is soft.     Tenderness: There is no abdominal tenderness.  Lymphadenopathy:     Cervical: No cervical adenopathy.  Skin:    General: Skin is warm and dry.     Findings: No rash.  Neurological:     Mental Status: She is alert and oriented to person, place, and time.  Psychiatric:     Comments: Flat affect      Lab Results Lab Results  Component Value Date   WBC 7.5 05/18/2020   HGB 9.4 (L) 05/18/2020   HCT 31.5 (L) 05/18/2020   MCV 92.1 05/18/2020   PLT 213 05/18/2020    Lab Results  Component Value Date   CREATININE 0.56 05/18/2020   BUN 10 05/18/2020   NA 135 05/18/2020   K 3.8 05/18/2020   CL 101 05/18/2020   CO2 23 05/18/2020    Lab Results  Component Value Date   ALT 162 (H) 05/13/2020   AST 138 (H) 05/13/2020   ALKPHOS 133 (H) 05/13/2020   BILITOT 0.4 05/13/2020     Microbiology: No results found for this or any previous visit (from the past 240 hour(s)).   07/13/2020, MSN,  NP-C [facility]  [email or web address] Pager: [phone] Office: [phone] [facility] Main Line: [phone]

## 2020-05-19 NOTE — Progress Notes (Signed)
PROGRESS NOTE    Candice Jordan  UKG:[id number] DOB: 03/10/2001 DOA: 04/27/2020 PCP: System, Provider Not In   Brief Narrative:  19 year old female presents to [facility] ED on 5/25 with reported 1 week of shortness of breath. Patient was found at [address] with oxygen saturation 79%. Reports using Heroin last night. Diagnosed with MRSA Bacteremia/Endocarditis and COVID 03/08/2020 however has left AMA numerous times throughout the month of April. On arrival to ED. CXR revealing of large right sided tension Pneumothorax. Chest Tube placed. 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 She has been IVC'd. IVC was renewed on 05/05/2020.  The patient has had continual tachycardia since admission. Likely due to [place] secondary to chronic cocaine and heroin abuse. CTA chest has ruled out PE, but does demonstrate moderate bilateral pleural effusions.  I have discussed the pleural effusions with IR. It seems that they are small and the patient is unlikely to benefit from thoracentesis.  Last blood culture which were positive with MRSA and patient will from 04/29/2020.   Blood culture from 05/03/2020 have been negative.  Patient remains on IV vancomycin per ID and they plan to continue this until 05/25/2020 and provide her 1 dose of IV oritavancin that day which will suffice for 2 weeks of IV antibiotic for MRSA and she can be discharged on that day.  Assessment & Plan:   Principal Problem:   Endocarditis of tricuspid valve Active Problems:   MRSA bacteremia   Spontaneous tension pneumothorax   AKI (acute kidney injury) ([place])   Sepsis without acute organ dysfunction (HCC)   Pneumothorax   Status post chest tube placement (HCC)   Polysubstance abuse (HCC)   Anemia   Hypokalemia   Hypomagnesemia   Tachycardia    Positive hepatitis C antibody test   Injection of illicit drug within last 12 months   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 Patient met criteria for sepsis on admission with tachypnea, tachycardia, leukocytosis, concern for untreated bacteremia. Repeat 2D echo which was done showed worsening/severe tricuspid valvular regurgitation, tricuspid valve vegetation attached to posterior leaflet measuring 0.9 cm x 0.5 cm more apparent on this study the last study. Severely dilated right atrial size. Hyperdynamic right ventricular systolic function.CT surgery was consulted 04/28/2020 for possible valve repair however due to ongoing IV drug abuse patient at high risk for reinfection if she were to undergo valve replacement and recommending focusing on drug rehabilitation. Per CT surgery obtaining a TEE will not change surgical candidacy and recommending medical management for now. Repeat blood cultures from 04/29/2020 + for staph aureus. Repeat blood cultures drawn on 05/03/2020 which were negative.  ID following and patient remains on IV vancomycin with estimated end date of therapy planned for 05/25/2020 per ID followed by 1 dose of IV oritavancin.  Acute hypoxemic respiratory distress secondary to right-sided tensionpneumothorax status post chest tube placement: Chest tube now removed. 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 Chest tube removed 04/28/2020 per PCCM. Repeat chest x-ray done showed no recurrent pneumothorax.  Recent CT angiogram of the chest done on 05/07/2020 shows significant improvement  in right pneumothorax. Pulmonary hygiene. Mucinex added due to ongoing cough. Place on Hycodan as needed. Supportive care.   Bilateral moderate pleural  effusions. CTA chest has demonstrated bilateral moderate pleural effusions. Will monitor and consider thoracentesis, however, the patient's history of tension pneumothorax is concerning for risk of recurrence. previous hospitalist have discussed the patient with Dr. Larena Glassman. He states that in his estimation the pleural effusions are small and do not warrant intervention at this time.  Repeat chest x-ray from yesterday shows possible bulla or small contained pneumothorax but patient remains asymptomatic.  Tachycardia: Likely due to chronic IV abuse of heroin and cocaine and endocarditis. CTA chest negative for PE.  Increased carvedilol to 6.25 p.o. twice daily on 05/15/2020. Tachycardia improving slowly.  Continue current management.  Polysubstance abuse/heroin use: UDS noted at outside hospital to be positive for amphetamines. Patient noted to have multiple admissions for endocarditis, where she has left AMA . Patient seen by psychiatry on 04/27/2020 and is felt patient lacks capacity to make an informed medical decision regarding her medical care and disposition and recommending social work evaluation to contact next of kin no appropriate hospital administrator to help with decision-making process. Continue Suboxone. Was on clonidine taper/detox protocol. Patient wanted to leave AMA again (04/29/2020). Patient however did state she understands the risk of leaving AMA which include death. Was also felt by PCCM that patient lacked capacity poor insight into her current medical situation.Patient with poor insight into her current medical situation. Patient not thinking rationally. Patient has subsequently been IVC'd to finish out treatment for MRSA disseminated infection/endocarditis with worsening tricuspid regurgitation.Patient has been reassessed by psych who are in agreement that patient lacks capacity.   Anemiaof chronic disease: Anemia panel consistent with anemia of chronic disease. Status  post transfusion 1 unit packed red blood cells. Hemoglobin 9.4 yesterday.  Hypomagnesemia/hypokalemia: Resolved.  Will repeat tomorrow.  Positive hep C antibody/transaminitis: HCV RNA quant of 2260. Outpatient follow-up with ID.   Back pain: Resolved according to patient.  Allergic rash/contact dermatitis in lower back: Resolved.  DVT prophylaxis: SCD   Code Status: Full Code  Family Communication:  None present at bedside.  Plan of care discussed with patient in length and he verbalized understanding and agreed with it. Patient is from: Home Disposition Plan: Home Barriers to discharge: Need for continuation of IV antibiotics to treat endocarditis  Status is: Inpatient  Remains inpatient appropriate because:Inpatient level of care appropriate due to severity of illness   Dispo: The patient is from: Home              Anticipated d/c is to: Home              Anticipated d/c date is: > 3 days              Patient currently is not medically stable to d/c.        Estimated body mass index is 19.22 kg/m as calculated from the following:   Height as of this encounter: '5\' 5"'$  (1.651 m).   Weight as of this encounter: 52.4 kg.      Nutritional status:               Consultants:   ID, cardiothoracic surgery  Procedures:   None  Antimicrobials:  Anti-infectives (From admission, onward)   Start     Dose/Rate Route Frequency Ordered Stop   06/01/20 1000  Oritavancin Diphosphate (ORBACTIV) 1,200 mg in dextrose 5 % IVPB  Status:  Discontinued        1,200 mg 333.3 mL/hr over 180 Minutes Intravenous Once 05/18/20 1040 05/18/20 1046   05/31/20 1330  Oritavancin Diphosphate (ORBACTIV) 1,200 mg in dextrose 5 % IVPB  Status:  Discontinued        1,200 mg 333.3 mL/hr over 180 Minutes Intravenous Once 05/18/20 1046 05/18/20 1444   05/25/20 1330  Oritavancin Diphosphate (ORBACTIV) 1,200 mg in dextrose 5 % IVPB     Discontinue     1,200 mg 333.3 mL/hr over 180  Minutes Intravenous Once 05/18/20 1444     05/07/20 1430  vancomycin (VANCOREADY) IVPB 750 mg/150 mL     Discontinue     750 mg 150 mL/hr over 60 Minutes Intravenous Every 8 hours 05/07/20 1313 05/25/20 1200   05/01/20 1430  vancomycin (VANCOCIN) IVPB 1000 mg/200 mL premix  Status:  Discontinued        1,000 mg 200 mL/hr over 60 Minutes Intravenous Every 8 hours 05/01/20 0946 05/07/20 1313   04/30/20 0600  vancomycin (VANCOCIN) IVPB 1000 mg/200 mL premix  Status:  Discontinued        1,000 mg 200 mL/hr over 60 Minutes Intravenous Every 8 hours 04/29/20 2318 05/01/20 0947   04/29/20 2330  vancomycin (VANCOREADY) IVPB 500 mg/100 mL  Status:  Discontinued        500 mg 100 mL/hr over 60 Minutes Intravenous Every 8 hours 04/29/20 1648 04/29/20 2315   04/27/20 1100  vancomycin (VANCOREADY) IVPB 500 mg/100 mL  Status:  Discontinued        500 mg 100 mL/hr over 60 Minutes Intravenous Every 8 hours 04/27/20 0210 04/29/20 1648   04/27/20 0300  vancomycin (VANCOCIN) IVPB 1000 mg/200 mL premix        1,000 mg 200 mL/hr over 60 Minutes Intravenous  Once 04/27/20 0203 04/27/20 0423   04/27/20 0300  piperacillin-tazobactam (ZOSYN) IVPB 3.375 g  Status:  Discontinued        3.375 g 12.5 mL/hr over 240 Minutes Intravenous Every 8 hours 04/27/20 0210 04/27/20 0856         Subjective: Seen and examined.  She has no complaints.  I was informed by nurse later that she was having some greenish vaginal discharge which patient did not mention to me.  RN advised to collect sample for GC and chlamydia.  Objective: Vitals:   05/18/20 1555 05/18/20 2016 05/19/20 0621 05/19/20 0818  BP: 115/69 1'05/65 99/72 98/63 '$  Pulse: 88 80 99 (!) 103  Resp: '18 18 18   '$ Temp: 98.3 F (36.8 C) 98.2 F (36.8 C) 98.4 F (36.9 C)   TempSrc: Oral Oral Oral   SpO2: 98% 98% 98% 96%  Weight:      Height:        Intake/Output Summary (Last 24 hours) at 05/19/2020 1046 Last data filed at 05/19/2020 1044 Gross per 24 hour    Intake 1540.73 ml  Output 0 ml  Net 1540.73 ml   Filed Weights   05/08/20 0625 05/09/20 1801 05/18/20 0500  Weight: 50.4 kg 50.7 kg 52.4 kg    Examination:  General exam: Appears calm and comfortable  Respiratory system: Clear to auscultation. Respiratory effort normal. Cardiovascular system: S1 & S2 heard, RRR. No JVD, murmurs, rubs, gallops or clicks. No pedal edema. Gastrointestinal system: Abdomen is nondistended, soft and nontender. No organomegaly or masses felt. Normal bowel sounds heard. Central nervous system: Alert and oriented. No focal neurological deficits. Extremities: Symmetric 5 x 5 power. Psychiatry: Judgement and  insight appear poor, mood & affect flat.  Data Reviewed: I have personally reviewed following labs and imaging studies  CBC: Recent Labs  Lab 05/13/20 0828 05/15/20 0858 05/18/20 0615  WBC 9.0 7.2 7.5  NEUTROABS 3.6 3.1 3.0  HGB 9.9* 9.6* 9.4*  HCT 32.9* 31.9* 31.5*  MCV 93.2 93.3 92.1  PLT 281 214 761   Basic Metabolic Panel: Recent Labs  Lab 05/13/20 0828 05/15/20 0858 05/16/20 1325 05/18/20 0615  NA 135 136  --  135  K 4.4 3.9  --  3.8  CL 103 100  --  101  CO2 25 26  --  23  GLUCOSE 93 131*  --  113*  BUN 15 11  --  10  CREATININE 0.51 0.63  --  0.56  CALCIUM 9.0 9.0  --  8.8*  MG 1.8 1.5* 1.9 1.5*   GFR: Estimated Creatinine Clearance: 92.8 mL/min (by C-G formula based on SCr of 0.56 mg/dL). Liver Function Tests: Recent Labs  Lab 05/13/20 0828  AST 138*  ALT 162*  ALKPHOS 133*  BILITOT 0.4  PROT 8.9*  ALBUMIN 2.6*   No results for input(s): LIPASE, AMYLASE in the last 168 hours. No results for input(s): AMMONIA in the last 168 hours. Coagulation Profile: No results for input(s): INR, PROTIME in the last 168 hours. Cardiac Enzymes: No results for input(s): CKTOTAL, CKMB, CKMBINDEX, TROPONINI in the last 168 hours. BNP (last 3 results) No results for input(s): PROBNP in the last 8760 hours. HbA1C: No results for  input(s): HGBA1C in the last 72 hours. CBG: No results for input(s): GLUCAP in the last 168 hours. Lipid Profile: No results for input(s): CHOL, HDL, LDLCALC, TRIG, CHOLHDL, LDLDIRECT in the last 72 hours. Thyroid Function Tests: No results for input(s): TSH, T4TOTAL, FREET4, T3FREE, THYROIDAB in the last 72 hours. Anemia Panel: No results for input(s): VITAMINB12, FOLATE, FERRITIN, TIBC, IRON, RETICCTPCT in the last 72 hours. Sepsis Labs: No results for input(s): PROCALCITON, LATICACIDVEN in the last 168 hours.  No results found for this or any previous visit (from the past 240 hour(s)).    Radiology Studies: No results found.  Scheduled Meds: . buprenorphine-naloxone  1 tablet Sublingual BID  . carvedilol  6.25 mg Oral BID WC  . dextromethorphan-guaiFENesin  1 tablet Oral BID  . lidocaine  1 patch Transdermal Q24H  . magnesium oxide  400 mg Oral BID  . QUEtiapine  50 mg Oral BID  . rivaroxaban  10 mg Oral Q supper   Continuous Infusions: . sodium chloride Stopped (05/02/20 2205)  . [START ON 05/25/2020] oritavancin (ORBACTIV) IVPB    . vancomycin 750 mg (05/19/20 0820)     LOS: 22 days   Time spent: 24 min   Darliss Cheney, MD [facility]  05/19/2020, 10:46 AM   To contact the attending provider between 7A-7P or the covering provider during after hours 7P-7A, please log into the web site [email or web address].

## 2020-05-20 LAB — MAGNESIUM: Magnesium: 1.6 mg/dL — ABNORMAL LOW (ref 1.7–2.4)

## 2020-05-20 MED ORDER — MAGNESIUM SULFATE 2 GM/50ML IV SOLN
2.0000 g | Freq: Once | INTRAVENOUS | Status: AC
  Administered 2020-05-20: 2 g via INTRAVENOUS
  Filled 2020-05-20: qty 50

## 2020-05-20 NOTE — Progress Notes (Signed)
PROGRESS NOTE    Candice Jordan  UKG:[id number] DOB: 03/10/2001 DOA: 04/27/2020 PCP: System, Provider Not In   Brief Narrative:  19 year old female presents to [facility] ED on 5/25 with reported 1 week of shortness of breath. Patient was found at [address] with oxygen saturation 79%. Reports using Heroin last night. Diagnosed with MRSA Bacteremia/Endocarditis and COVID 03/08/2020 however has left AMA numerous times throughout the month of April. On arrival to ED. CXR revealing of large right sided tension Pneumothorax. Chest Tube placed. 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 She has been IVC'd. IVC was renewed on 05/05/2020.  The patient has had continual tachycardia since admission. Likely due to [place] secondary to chronic cocaine and heroin abuse. CTA chest has ruled out PE, but does demonstrate moderate bilateral pleural effusions.  I have discussed the pleural effusions with IR. It seems that they are small and the patient is unlikely to benefit from thoracentesis.  Last blood culture which were positive with MRSA and patient will from 04/29/2020.   Blood culture from 05/03/2020 have been negative.  Patient remains on IV vancomycin per ID and they plan to continue this until 05/25/2020 and provide her 1 dose of IV oritavancin that day which will suffice for 2 weeks of IV antibiotic for MRSA and she can be discharged on that day.  Assessment & Plan:   Principal Problem:   Endocarditis of tricuspid valve Active Problems:   MRSA bacteremia   Spontaneous tension pneumothorax   AKI (acute kidney injury) ([place])   Sepsis without acute organ dysfunction (HCC)   Pneumothorax   Status post chest tube placement (HCC)   Polysubstance abuse (HCC)   Anemia   Hypokalemia   Hypomagnesemia   Tachycardia    Positive hepatitis C antibody test   Injection of illicit drug within last 12 months   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 Patient met criteria for sepsis on admission with tachypnea, tachycardia, leukocytosis, concern for untreated bacteremia. Repeat 2D echo which was done showed worsening/severe tricuspid valvular regurgitation, tricuspid valve vegetation attached to posterior leaflet measuring 0.9 cm x 0.5 cm more apparent on this study the last study. Severely dilated right atrial size. Hyperdynamic right ventricular systolic function.CT surgery was consulted 04/28/2020 for possible valve repair however due to ongoing IV drug abuse patient at high risk for reinfection if she were to undergo valve replacement and recommending focusing on drug rehabilitation. Per CT surgery obtaining a TEE will not change surgical candidacy and recommending medical management for now. Repeat blood cultures from 04/29/2020 + for staph aureus. Repeat blood cultures drawn on 05/03/2020 which were negative.  ID following and patient remains on IV vancomycin with estimated end date of therapy planned for 05/25/2020 per ID followed by 1 dose of IV oritavancin.  Acute hypoxemic respiratory distress secondary to right-sided tensionpneumothorax status post chest tube placement: Chest tube now removed. 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 Chest tube removed 04/28/2020 per PCCM. Repeat chest x-ray done showed no recurrent pneumothorax.  Recent CT angiogram of the chest done on 05/07/2020 shows significant improvement  in right pneumothorax. Pulmonary hygiene. Mucinex added due to ongoing cough. Place on Hycodan as needed. Supportive care.   Bilateral moderate pleural  effusions. CTA chest has demonstrated bilateral moderate pleural effusions. Will monitor and consider thoracentesis, however, the patient's history of tension pneumothorax is concerning for risk of recurrence. previous hospitalist have discussed the patient with Dr. Larena Glassman. He states that in his estimation the pleural effusions are small and do not warrant intervention at this time.  Repeat chest x-ray from yesterday shows possible bulla or small contained pneumothorax but patient remains asymptomatic.  Tachycardia: Likely due to chronic IV abuse of heroin and cocaine and endocarditis. CTA chest negative for PE.  Increased carvedilol to 6.25 p.o. twice daily on 05/15/2020. Tachycardia improving slowly.  Continue current management.  Polysubstance abuse/heroin use: UDS noted at outside hospital to be positive for amphetamines. Patient noted to have multiple admissions for endocarditis, where she has left AMA . Patient seen by psychiatry on 04/27/2020 and is felt patient lacks capacity to make an informed medical decision regarding her medical care and disposition and recommending social work evaluation to contact next of kin no appropriate hospital administrator to help with decision-making process. Continue Suboxone. Was on clonidine taper/detox protocol. Patient wanted to leave AMA again (04/29/2020). Patient however did state she understands the risk of leaving AMA which include death. Was also felt by PCCM that patient lacked capacity poor insight into her current medical situation.Patient with poor insight into her current medical situation. Patient not thinking rationally. Patient has subsequently been IVC'd to finish out treatment for MRSA disseminated infection/endocarditis with worsening tricuspid regurgitation.Patient has been reassessed by psych who are in agreement that patient lacks capacity.   Anemiaof chronic disease: Anemia panel consistent with anemia of chronic disease. Status  post transfusion 1 unit packed red blood cells. Hemoglobin 9.4 yesterday.  Hypomagnesemia/hypokalemia: Magnesium low again.  Will replace.  Potassium normal.  Recheck in the morning.  Positive hep C antibody/transaminitis: HCV RNA quant of 2260. Outpatient follow-up with ID.   Back pain: Resolved according to patient.  Allergic rash/contact dermatitis in lower back: Resolved.  Vaginal discharge: Apparently patient had complained to the nurse yesterday that she was having some vaginal discharge however she did not complain to me yesterday.  When I asked today, she stated she does not have any discharge.  Nurse yesterday was advised to collect sample for [facility] and chlamydia if and when she has discharge.  DVT prophylaxis: SCD   Code Status: Full Code  Family Communication:  None present at bedside.  Plan of care discussed with patient in length and he verbalized understanding and agreed with it. Patient is from: Home Disposition Plan: Home Barriers to discharge: Need for continuation of IV antibiotics to treat endocarditis  Status is: Inpatient  Remains inpatient appropriate because:Inpatient level of care appropriate due to severity of illness   Dispo: The patient is from: Home              Anticipated d/c is to: Home              Anticipated d/c date is: 06/24/2020              Patient currently is not medically stable to d/c.        Estimated body mass index is 19.22 kg/m as calculated from the following:   Height as of this encounter: _0  (1.651 m).   Weight as of this encounter: 52.4 kg.  Nutritional status:               Consultants:   ID, cardiothoracic surgery  Procedures:   None  Antimicrobials:  Anti-infectives (From admission, onward)   Start     Dose/Rate Route Frequency Ordered Stop   06/01/20 1000  Oritavancin Diphosphate (ORBACTIV) 1,200 mg in dextrose 5 % IVPB  Status:  Discontinued        1,200 mg 333.3 mL/hr over 180 Minutes  Intravenous Once 05/18/20 1040 05/18/20 1046   05/31/20 1330  Oritavancin Diphosphate (ORBACTIV) 1,200 mg in dextrose 5 % IVPB  Status:  Discontinued        1,200 mg 333.3 mL/hr over 180 Minutes Intravenous Once 05/18/20 1046 05/18/20 1444   05/25/20 1330  Oritavancin Diphosphate (ORBACTIV) 1,200 mg in dextrose 5 % IVPB     Discontinue     1,200 mg 333.3 mL/hr over 180 Minutes Intravenous Once 05/18/20 1444     05/07/20 1430  vancomycin (VANCOREADY) IVPB 750 mg/150 mL     Discontinue     750 mg 150 mL/hr over 60 Minutes Intravenous Every 8 hours 05/07/20 1313 05/25/20 1200   05/01/20 1430  vancomycin (VANCOCIN) IVPB 1000 mg/200 mL premix  Status:  Discontinued        1,000 mg 200 mL/hr over 60 Minutes Intravenous Every 8 hours 05/01/20 0946 05/07/20 1313   04/30/20 0600  vancomycin (VANCOCIN) IVPB 1000 mg/200 mL premix  Status:  Discontinued        1,000 mg 200 mL/hr over 60 Minutes Intravenous Every 8 hours 04/29/20 2318 05/01/20 0947   04/29/20 2330  vancomycin (VANCOREADY) IVPB 500 mg/100 mL  Status:  Discontinued        500 mg 100 mL/hr over 60 Minutes Intravenous Every 8 hours 04/29/20 1648 04/29/20 2315   04/27/20 1100  vancomycin (VANCOREADY) IVPB 500 mg/100 mL  Status:  Discontinued        500 mg 100 mL/hr over 60 Minutes Intravenous Every 8 hours 04/27/20 0210 04/29/20 1648   04/27/20 0300  vancomycin (VANCOCIN) IVPB 1000 mg/200 mL premix        1,000 mg 200 mL/hr over 60 Minutes Intravenous  Once 04/27/20 0203 04/27/20 0423   04/27/20 0300  piperacillin-tazobactam (ZOSYN) IVPB 3.375 g  Status:  Discontinued        3.375 g 12.5 mL/hr over 240 Minutes Intravenous Every 8 hours 04/27/20 0210 04/27/20 0856         Subjective: Seen and examined.  No complaints.  Objective: Vitals:   05/20/20 0130 05/20/20 0153 05/20/20 0614 05/20/20 0900  BP: 95/60  98/68 97/68  Pulse: (!) 101  (!) 108 (!) 104  Resp: _0 Temp:  97.8 F (36.6 C) 98.1 F (36.7 C) 98.7 F (37.1  C)  TempSrc:  Oral Oral Oral  SpO2: 98%  98% 96%  Weight:      Height:        Intake/Output Summary (Last 24 hours) at 05/20/2020 1119 Last data filed at 05/20/2020 0200 Gross per 24 hour  Intake 893.19 ml  Output 0 ml  Net 893.19 ml   Filed Weights   05/08/20 0625 05/09/20 1801 05/18/20 0500  Weight: 50.4 kg 50.7 kg 52.4 kg    Examination:  General exam: Appears calm and comfortable  Respiratory system: Clear to auscultation. Respiratory effort normal. Cardiovascular system: S1 & S2 heard, RRR. No JVD, murmurs, rubs, gallops or clicks. No pedal edema. Gastrointestinal system: Abdomen is nondistended,  soft and nontender. No organomegaly or masses felt. Normal bowel sounds heard. Central nervous system: Alert and oriented. No focal neurological deficits. Extremities: Symmetric 5 x 5 power. Psychiatry: Judgement and insight appear poor. Mood & affect flat.  Data Reviewed: I have personally reviewed following labs and imaging studies  CBC: Recent Labs  Lab 05/15/20 0858 05/18/20 0615  WBC 7.2 7.5  NEUTROABS 3.1 3.0  HGB 9.6* 9.4*  HCT 31.9* 31.5*  MCV 93.3 92.1  PLT 214 347   Basic Metabolic Panel: Recent Labs  Lab 05/15/20 0858 05/16/20 1325 05/18/20 0615 05/20/20 0840  NA 136  --  135  --   K 3.9  --  3.8  --   CL 100  --  101  --   CO2 26  --  23  --   GLUCOSE 131*  --  113*  --   BUN 11  --  10  --   CREATININE 0.63  --  0.56  --   CALCIUM 9.0  --  8.8*  --   MG 1.5* 1.9 1.5* 1.6*   GFR: Estimated Creatinine Clearance: 92.8 mL/min (by C-G formula based on SCr of 0.56 mg/dL). Liver Function Tests: No results for input(s): AST, ALT, ALKPHOS, BILITOT, PROT, ALBUMIN in the last 168 hours. No results for input(s): LIPASE, AMYLASE in the last 168 hours. No results for input(s): AMMONIA in the last 168 hours. Coagulation Profile: No results for input(s): INR, PROTIME in the last 168 hours. Cardiac Enzymes: No results for input(s): CKTOTAL, CKMB,  CKMBINDEX, TROPONINI in the last 168 hours. BNP (last 3 results) No results for input(s): PROBNP in the last 8760 hours. HbA1C: No results for input(s): HGBA1C in the last 72 hours. CBG: No results for input(s): GLUCAP in the last 168 hours. Lipid Profile: No results for input(s): CHOL, HDL, LDLCALC, TRIG, CHOLHDL, LDLDIRECT in the last 72 hours. Thyroid Function Tests: No results for input(s): TSH, T4TOTAL, FREET4, T3FREE, THYROIDAB in the last 72 hours. Anemia Panel: No results for input(s): VITAMINB12, FOLATE, FERRITIN, TIBC, IRON, RETICCTPCT in the last 72 hours. Sepsis Labs: No results for input(s): PROCALCITON, LATICACIDVEN in the last 168 hours.  No results found for this or any previous visit (from the past 240 hour(s)).    Radiology Studies: No results found.  Scheduled Meds: . buprenorphine-naloxone  1 tablet Sublingual BID  . carvedilol  6.25 mg Oral BID WC  . dextromethorphan-guaiFENesin  1 tablet Oral BID  . lidocaine  1 patch Transdermal Q24H  . magnesium oxide  400 mg Oral BID  . QUEtiapine  50 mg Oral BID  . rivaroxaban  10 mg Oral Q supper   Continuous Infusions: . sodium chloride 500 mL (05/19/20 2339)  . [START ON 05/25/2020] oritavancin (ORBACTIV) IVPB    . vancomycin 750 mg (05/20/20 0855)     LOS: 23 days   Time spent: 25 min   Darliss Cheney, MD [facility]  05/20/2020, 11:19 AM   To contact the attending provider between 7A-7P or the covering provider during after hours 7P-7A, please log into the web site [email or web address].

## 2020-05-20 NOTE — Progress Notes (Signed)
Patient developed allergic reaction to tape where old IV site was on LW. Patient denied pain. IV removed and site cleaned. Left open to air. Will continue to re-assess.

## 2020-05-20 NOTE — Plan of Care (Signed)
  Problem: Activity: Goal: Risk for activity intolerance will decrease Outcome: Progressing   

## 2020-05-21 LAB — CBC WITH DIFFERENTIAL/PLATELET
Abs Immature Granulocytes: 0.02 10*3/uL (ref 0.00–0.07)
Basophils Absolute: 0.1 10*3/uL (ref 0.0–0.1)
Basophils Relative: 1 %
Eosinophils Absolute: 0.1 10*3/uL (ref 0.0–0.5)
Eosinophils Relative: 2 %
HCT: 31.1 % — ABNORMAL LOW (ref 36.0–46.0)
Hemoglobin: 9.4 g/dL — ABNORMAL LOW (ref 12.0–15.0)
Immature Granulocytes: 0 %
Lymphocytes Relative: 49 %
Lymphs Abs: 3.5 10*3/uL (ref 0.7–4.0)
MCH: 27.5 pg (ref 26.0–34.0)
MCHC: 30.2 g/dL (ref 30.0–36.0)
MCV: 90.9 fL (ref 80.0–100.0)
Monocytes Absolute: 0.7 10*3/uL (ref 0.1–1.0)
Monocytes Relative: 10 %
Neutro Abs: 2.8 10*3/uL (ref 1.7–7.7)
Neutrophils Relative %: 38 %
Platelets: 176 10*3/uL (ref 150–400)
RBC: 3.42 MIL/uL — ABNORMAL LOW (ref 3.87–5.11)
RDW: 17.2 % — ABNORMAL HIGH (ref 11.5–15.5)
WBC: 7.2 10*3/uL (ref 4.0–10.5)
nRBC: 0 % (ref 0.0–0.2)

## 2020-05-21 LAB — MAGNESIUM: Magnesium: 1.6 mg/dL — ABNORMAL LOW (ref 1.7–2.4)

## 2020-05-21 LAB — BASIC METABOLIC PANEL
Anion gap: 8 (ref 5–15)
BUN: 9 mg/dL (ref 6–20)
CO2: 24 mmol/L (ref 22–32)
Calcium: 8.8 mg/dL — ABNORMAL LOW (ref 8.9–10.3)
Chloride: 103 mmol/L (ref 98–111)
Creatinine, Ser: 0.57 mg/dL (ref 0.44–1.00)
GFR calc Af Amer: >60 mL/min (ref >60)
GFR calc non Af Amer: >60 mL/min (ref >60)
Glucose, Bld: 100 mg/dL — ABNORMAL HIGH (ref 70–99)
Potassium: 4.4 mmol/L (ref 3.5–5.1)
Sodium: 135 mmol/L (ref 135–145)

## 2020-05-21 LAB — VANCOMYCIN, TROUGH: Vancomycin Tr: 13 ug/mL — ABNORMAL LOW (ref 15–20)

## 2020-05-21 MED ORDER — VANCOMYCIN HCL IN DEXTROSE 1-5 GM/200ML-% IV SOLN
1000.0000 mg | Freq: Three times a day (TID) | INTRAVENOUS | Status: AC
  Administered 2020-05-21 – 2020-05-25 (×12): 1000 mg via INTRAVENOUS
  Filled 2020-05-21 (×12): qty 200

## 2020-05-21 MED ORDER — MAGNESIUM SULFATE IN D5W 1-5 GM/100ML-% IV SOLN
1.0000 g | Freq: Once | INTRAVENOUS | Status: AC
  Administered 2020-05-21: 1 g via INTRAVENOUS
  Filled 2020-05-21: qty 100

## 2020-05-21 NOTE — Progress Notes (Signed)
PROGRESS NOTE    Candice Jordan  ION:[id number] DOB: Sep 22, 2001 DOA: 04/27/2020 PCP: System, Provider Not In   Brief Narrative:  As per Dr. Joni Fears "19 year old female presents to [facility] ED on 5/25 with reported 1 week of shortness of breath. Patient was found at [address] with oxygen saturation 79%. Reports using Heroin last night. Diagnosed with MRSA Bacteremia/Endocarditis and COVID 03/08/2020 however has left AMA numerous times throughout the month of April. On arrival to ED. CXR revealing of large right sided tension Pneumothorax. Chest Tube placed. 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 She has been IVC'd. IVC was renewed on 05/05/2020.  The patient has had continual tachycardia since admission. Likely due to [place] secondary to chronic cocaine and heroin abuse. CTA chest has ruled out PE, but does demonstrate moderate bilateral pleural effusions.  I have discussed the pleural effusions with IR. It seems that they are small and the patient is unlikely to benefit from thoracentesis.  Last blood culture which were positive with MRSA and patient will from 04/29/2020.   Blood culture from 05/03/2020 have been negative.  Patient remains on IV vancomycin per ID and they plan to continue this until 05/25/2020 and provide her 1 dose of IV oritavancin that day which will suffice for 2 weeks of IV antibiotic for MRSA and she can be discharged on that day".  05/21/2020: Patient seen.  No new complaints.  Assessment & Plan:   Principal Problem:   Endocarditis of tricuspid valve Active Problems:   MRSA bacteremia   Spontaneous tension pneumothorax   AKI (acute kidney injury) ([place])   Sepsis without acute organ dysfunction (HCC)   Pneumothorax   Status post chest tube placement (HCC)   Polysubstance  abuse (HCC)   Anemia   Hypokalemia   Hypomagnesemia   Tachycardia   Positive hepatitis C antibody test   Injection of illicit drug within last 12 months   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 Patient met criteria for sepsis on admission with tachypnea, tachycardia, leukocytosis, concern for untreated bacteremia. Repeat 2D echo which was done showed worsening/severe tricuspid valvular regurgitation, tricuspid valve vegetation attached to posterior leaflet measuring 0.9 cm x 0.5 cm more apparent on this study the last study. Severely dilated right atrial size. Hyperdynamic right ventricular systolic function.CT surgery was consulted 04/28/2020 for possible valve repair however due to ongoing IV drug abuse patient at high risk for reinfection if she were to undergo valve replacement and recommending focusing on drug rehabilitation. Per CT surgery obtaining a TEE will not change surgical candidacy and recommending medical management for now. Repeat blood cultures from 04/29/2020 + for staph aureus. Repeat blood cultures drawn on 05/03/2020 which were negative.  ID following and patient remains on IV vancomycin with estimated end date of therapy planned for 05/25/2020 per ID followed by 1 dose of IV oritavancin. 05/21/2020: Sepsis physiology has resolved.  Complete course of antibiotics.  Acute hypoxemic respiratory distress secondary to right-sided tensionpneumothorax status post chest tube placement: Chest tube now removed. 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 Chest tube removed 04/28/2020  per PCCM. Repeat chest x-ray done showed no recurrent pneumothorax.  Recent CT angiogram of the chest done on 05/07/2020 shows significant improvement in  right pneumothorax. Pulmonary hygiene. Mucinex added due to ongoing cough. Place on Hycodan as needed. Supportive care. 05/21/2020: Patient is not on supplemental oxygen.  Bilateral moderate pleural effusions. CTA chest has demonstrated bilateral moderate pleural effusions. Will monitor and consider thoracentesis, however, the patient's history of tension pneumothorax is concerning for risk of recurrence. previous hospitalist have discussed the patient with Dr. Larena Glassman. He states that in his estimation the pleural effusions are small and do not warrant intervention at this time.  Repeat chest x-ray from yesterday shows possible bulla or small contained pneumothorax but patient remains asymptomatic.  Tachycardia: Likely due to chronic IV abuse of heroin and cocaine and endocarditis. CTA chest negative for PE.  Increased carvedilol to 6.25 p.o. twice daily on 05/15/2020. Tachycardia improving slowly.  Continue current management.  Polysubstance abuse/heroin use: UDS noted at outside hospital to be positive for amphetamines. Patient noted to have multiple admissions for endocarditis, where she has left AMA . Patient seen by psychiatry on 04/27/2020 and is felt patient lacks capacity to make an informed medical decision regarding her medical care and disposition and recommending social work evaluation to contact next of kin no appropriate hospital administrator to help with decision-making process. Continue Suboxone. Was on clonidine taper/detox protocol. Patient wanted to leave AMA again (04/29/2020). Patient however did state she understands the risk of leaving AMA which include death. Was also felt by PCCM that patient lacked capacity poor insight into her current medical situation.Patient with poor insight into her current medical situation. Patient not thinking rationally. Patient has subsequently been IVC'd to finish out treatment for MRSA disseminated infection/endocarditis with worsening  tricuspid regurgitation.Patient has been reassessed by psych who are in agreement that patient lacks capacity. 05/21/2020: Counseled to quit illicit drug use.  Anemiaof chronic disease: Anemia panel consistent with anemia of chronic disease. Status post transfusion 1 unit packed red blood cells. Hemoglobin 9.4 yesterday. 05/21/2020: Hemoglobin is stable.  Hemoglobin today is 9.5 g/dL.  Hypomagnesemia/hypokalemia: Magnesium low again.  Will replace.  Potassium normal.  Recheck in the morning. 05/21/2020: Potassium is 4.4.  Magnesium is 1.6.  Will give 1 g of magnesium IV.  Positive hep C antibody/transaminitis: HCV RNA quant of 2260. Outpatient follow-up with ID.   Back pain: Resolved according to patient.  Allergic rash/contact dermatitis in lower back: Resolved.  DVT prophylaxis: SCD   Code Status: Full Code  Family Communication:  None present at bedside.  Plan of care discussed with patient in length and he verbalized understanding and agreed with it. Patient is from: Home Disposition Plan: Home Barriers to discharge: Need for continuation of IV antibiotics to treat endocarditis  Status is: Inpatient  Remains inpatient appropriate because:Inpatient level of care appropriate due to severity of illness   Dispo: The patient is from: Home              Anticipated d/c is to: Home              Anticipated d/c date is: 06/24/2020              Patient currently is not medically stable to d/c.        Estimated body mass index is 19.22 kg/m as calculated from the following:   Height as of this encounter: 5' 5" (1.651 m).   Weight as of this encounter: 52.4 kg.  Nutritional status:  Consultants:   ID, cardiothoracic surgery  Procedures:   None  Antimicrobials:  Anti-infectives (From admission, onward)   Start     Dose/Rate Route Frequency Ordered Stop   06/01/20 1000  Oritavancin Diphosphate (ORBACTIV) 1,200 mg in dextrose 5 % IVPB  Status:  Discontinued         1,200 mg 333.3 mL/hr over 180 Minutes Intravenous Once 05/18/20 1040 05/18/20 1046   05/31/20 1330  Oritavancin Diphosphate (ORBACTIV) 1,200 mg in dextrose 5 % IVPB  Status:  Discontinued        1,200 mg 333.3 mL/hr over 180 Minutes Intravenous Once 05/18/20 1046 05/18/20 1444   05/25/20 1330  Oritavancin Diphosphate (ORBACTIV) 1,200 mg in dextrose 5 % IVPB     Discontinue     1,200 mg 333.3 mL/hr over 180 Minutes Intravenous Once 05/18/20 1444     05/21/20 1600  vancomycin (VANCOCIN) IVPB 1000 mg/200 mL premix     Discontinue     1,000 mg 200 mL/hr over 60 Minutes Intravenous Every 8 hours 05/21/20 0914 05/25/20 1200   05/07/20 1430  vancomycin (VANCOREADY) IVPB 750 mg/150 mL  Status:  Discontinued        750 mg 150 mL/hr over 60 Minutes Intravenous Every 8 hours 05/07/20 1313 05/21/20 0914   05/01/20 1430  vancomycin (VANCOCIN) IVPB 1000 mg/200 mL premix  Status:  Discontinued        1,000 mg 200 mL/hr over 60 Minutes Intravenous Every 8 hours 05/01/20 0946 05/07/20 1313   04/30/20 0600  vancomycin (VANCOCIN) IVPB 1000 mg/200 mL premix  Status:  Discontinued        1,000 mg 200 mL/hr over 60 Minutes Intravenous Every 8 hours 04/29/20 2318 05/01/20 0947   04/29/20 2330  vancomycin (VANCOREADY) IVPB 500 mg/100 mL  Status:  Discontinued        500 mg 100 mL/hr over 60 Minutes Intravenous Every 8 hours 04/29/20 1648 04/29/20 2315   04/27/20 1100  vancomycin (VANCOREADY) IVPB 500 mg/100 mL  Status:  Discontinued        500 mg 100 mL/hr over 60 Minutes Intravenous Every 8 hours 04/27/20 0210 04/29/20 1648   04/27/20 0300  vancomycin (VANCOCIN) IVPB 1000 mg/200 mL premix        1,000 mg 200 mL/hr over 60 Minutes Intravenous  Once 04/27/20 0203 04/27/20 0423   04/27/20 0300  piperacillin-tazobactam (ZOSYN) IVPB 3.375 g  Status:  Discontinued        3.375 g 12.5 mL/hr over 240 Minutes Intravenous Every 8 hours 04/27/20 0210 04/27/20 0856         Subjective: No fever or chills.     No shortness of breath  No chest pain.    Objective: Vitals:   05/20/20 1749 05/20/20 2146 05/21/20 0500 05/21/20 0900  BP: 92/62 103/65 96/65 99/66  Pulse: (!) 103 (!) 102 (!) 102 99  Resp: _0 Temp: 98.5 F (36.9 C) 98.4 F (36.9 C) 98.5 F (36.9 C) 98.7 F (37.1 C)  TempSrc: Oral Oral Oral Oral  SpO2: 94% 99% 97% 97%  Weight:      Height:        Intake/Output Summary (Last 24 hours) at 05/21/2020 1705 Last data filed at 05/21/2020 0904 Gross per 24 hour  Intake 510 ml  Output --  Net 510 ml   Filed Weights   05/08/20 0625 05/09/20 1801 05/18/20 0500  Weight: 50.4 kg 50.7 kg 52.4 kg    Examination:  General exam: Appears calm and comfortable  Respiratory system: Clear to auscultation. Respiratory effort normal. Cardiovascular system: S1 & S2 heard Gastrointestinal system: Abdomen is nondistended, soft and nontender. No organomegaly or masses felt. Normal bowel sounds heard. Central nervous system: Alert and oriented.  Patient moves all extremities.   Extremities: No leg edema. Data Reviewed: I have personally reviewed following labs and imaging studies  CBC: Recent Labs  Lab 05/15/20 0858 05/18/20 0615 05/21/20 0736  WBC 7.2 7.5 7.2  NEUTROABS 3.1 3.0 2.8  HGB 9.6* 9.4* 9.4*  HCT 31.9* 31.5* 31.1*  MCV 93.3 92.1 90.9  PLT 214 213 315   Basic Metabolic Panel: Recent Labs  Lab 05/15/20 0858 05/16/20 1325 05/18/20 0615 05/20/20 0840 05/21/20 0736  NA 136  --  135  --  135  K 3.9  --  3.8  --  4.4  CL 100  --  101  --  103  CO2 26  --  23  --  24  GLUCOSE 131*  --  113*  --  100*  BUN 11  --  10  --  9  CREATININE 0.63  --  0.56  --  0.57  CALCIUM 9.0  --  8.8*  --  8.8*  MG 1.5* 1.9 1.5* 1.6* 1.6*   GFR: Estimated Creatinine Clearance: 92.8 mL/min (by C-G formula based on SCr of 0.57 mg/dL). Liver Function Tests: No results for input(s): AST, ALT, ALKPHOS, BILITOT, PROT, ALBUMIN in the last 168 hours. No results for input(s):  LIPASE, AMYLASE in the last 168 hours. No results for input(s): AMMONIA in the last 168 hours. Coagulation Profile: No results for input(s): INR, PROTIME in the last 168 hours. Cardiac Enzymes: No results for input(s): CKTOTAL, CKMB, CKMBINDEX, TROPONINI in the last 168 hours. BNP (last 3 results) No results for input(s): PROBNP in the last 8760 hours. HbA1C: No results for input(s): HGBA1C in the last 72 hours. CBG: No results for input(s): GLUCAP in the last 168 hours. Lipid Profile: No results for input(s): CHOL, HDL, LDLCALC, TRIG, CHOLHDL, LDLDIRECT in the last 72 hours. Thyroid Function Tests: No results for input(s): TSH, T4TOTAL, FREET4, T3FREE, THYROIDAB in the last 72 hours. Anemia Panel: No results for input(s): VITAMINB12, FOLATE, FERRITIN, TIBC, IRON, RETICCTPCT in the last 72 hours. Sepsis Labs: No results for input(s): PROCALCITON, LATICACIDVEN in the last 168 hours.  No results found for this or any previous visit (from the past 240 hour(s)).    Radiology Studies: No results found.  Scheduled Meds: . buprenorphine-naloxone  1 tablet Sublingual BID  . carvedilol  6.25 mg Oral BID WC  . dextromethorphan-guaiFENesin  1 tablet Oral BID  . lidocaine  1 patch Transdermal Q24H  . magnesium oxide  400 mg Oral BID  . QUEtiapine  50 mg Oral BID  . rivaroxaban  10 mg Oral Q supper   Continuous Infusions: . sodium chloride 500 mL (05/19/20 2339)  . [START ON 05/25/2020] oritavancin (ORBACTIV) IVPB    . vancomycin       LOS: 24 days   Time spent: 25 min   Bonnell Public, MD [facility]  05/21/2020, 5:05 PM   To contact the attending provider between 7A-7P or the covering provider during after hours 7P-7A, please log into the web site [email or web address].

## 2020-05-21 NOTE — Progress Notes (Addendum)
Pharmacy Antibiotic Note  Candice Jordan is a 19 y.o. female admitted on 04/27/2020 with MRSA bacteremia/TV endocarditis and septic emboli.   Today patient is afebrile, WBC WNL/stable, Scr <1/stable. Vancomycin steady state trough = 13 mcg/ml on regimen of vancomycin 750 mg IV q8h. Goal vancomycin trough: 15-20 mcg/ml Vancomycin expected to be continued until 05/25/20 and provide one dose of IV Oritavancin on 05/25/20 prior to discharge  Plan: Increase Vancomycin to 1000 mg IV every 8 hours    Will follow renal function and clinical progress Obtain vancomycin trough if clinical status changes or if renal function becomes unstable   Height: 5\' 5"  (165.1 cm) Weight: 52.4 kg (115 lb 8.3 oz) IBW/kg (Calculated) : 57  Temp (24hrs), Avg:98.5 F (36.9 C), Min:98.4 F (36.9 C), Max:98.7 F (37.1 C)  Recent Labs  Lab 05/15/20 0858 05/18/20 0615 05/21/20 0736  WBC 7.2 7.5 7.2  CREATININE 0.63 0.56 0.57  VANCOTROUGH  --   --  13*    Estimated Creatinine Clearance: 92.8 mL/min (by C-G formula based on SCr of 0.57 mg/dL).    No Known Allergies  Antimicrobials this admission: Vancomycin 5/26>> Zosyn 5/25>>5/26  Dose adjustments this admission: -5/28: VT 8 - change from 500 mg IV every 8 hours to 1000 mg IV every 8 hours -5/30: VT 16 - continue same regimen -6/4: VT 29 - collected during infusion, ordered another VT for 6/5 -6/5: VT 22 - Change from 1000 mg IV Q8H to 750 mg IV Q8H -6/8: VT 14 - continue 750mg  IV q8h for now -6/12 VT 16 - continue 750 mg IV q8h -6/19 VT 13 - Increase to 1000 mg IV Q8h   Microbiology results: 5/25 Bcx (at OSH): GPC in clusters (2/2) -MRSA  5/26BCx: staph aureus 2/2; BCID w/ MRSA 1/4 5/26UCx: neg 5/26MRSA PCR: pos  5/28 Bcx: staph aurus 2/2 6/1 Bcx: NGTD  6/28, PharmD, BCPS Clinical Pharmacist [facility] Please utilize Amion for appropriate phone number to reach the unit pharmacist [facility])   05/21/2020 9:18  AM

## 2020-05-22 LAB — MAGNESIUM: Magnesium: 1.6 mg/dL — ABNORMAL LOW (ref 1.7–2.4)

## 2020-05-22 LAB — RENAL FUNCTION PANEL
Albumin: 2.5 g/dL — ABNORMAL LOW (ref 3.5–5.0)
Anion gap: 8 (ref 5–15)
BUN: 8 mg/dL (ref 6–20)
CO2: 26 mmol/L (ref 22–32)
Calcium: 8.9 mg/dL (ref 8.9–10.3)
Chloride: 101 mmol/L (ref 98–111)
Creatinine, Ser: 0.59 mg/dL (ref 0.44–1.00)
GFR calc Af Amer: >60 mL/min (ref >60)
GFR calc non Af Amer: >60 mL/min (ref >60)
Glucose, Bld: 146 mg/dL — ABNORMAL HIGH (ref 70–99)
Phosphorus: 5.1 mg/dL — ABNORMAL HIGH (ref 2.5–4.6)
Potassium: 3.9 mmol/L (ref 3.5–5.1)
Sodium: 135 mmol/L (ref 135–145)

## 2020-05-22 NOTE — Progress Notes (Signed)
PROGRESS NOTE    Candice Jordan  FXO:[id number] DOB: 16-Nov-2001 DOA: 04/27/2020 PCP: System, Provider Not In   Brief Narrative:  As per Dr. Joni Fears "19 year old female presents to [facility] ED on 5/25 with reported 1 week of shortness of breath. Patient was found at [address] with oxygen saturation 79%. Reports using Heroin last night. Diagnosed with MRSA Bacteremia/Endocarditis and COVID 03/08/2020 however has left AMA numerous times throughout the month of April. On arrival to ED. CXR revealing of large right sided tension Pneumothorax. Chest Tube placed. 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 She has been IVC'd. IVC was renewed on 05/05/2020.  The patient has had continual tachycardia since admission. Likely due to [place] secondary to chronic cocaine and heroin abuse. CTA chest has ruled out PE, but does demonstrate moderate bilateral pleural effusions.  I have discussed the pleural effusions with IR. It seems that they are small and the patient is unlikely to benefit from thoracentesis.  Last blood culture which were positive with MRSA and patient will from 04/29/2020.   Blood culture from 05/03/2020 have been negative.  Patient remains on IV vancomycin per ID and they plan to continue this until 05/25/2020 and provide her 1 dose of IV oritavancin that day which will suffice for 2 weeks of IV antibiotic for MRSA and she can be discharged on that day".  05/22/2020: Patient seen.  No new complaints.  Assessment & Plan:   Principal Problem:   Endocarditis of tricuspid valve Active Problems:   MRSA bacteremia   Spontaneous tension pneumothorax   AKI (acute kidney injury) ([place])   Sepsis without acute organ dysfunction (HCC)   Pneumothorax   Status post chest tube placement (HCC)   Polysubstance  abuse (HCC)   Anemia   Hypokalemia   Hypomagnesemia   Tachycardia   Positive hepatitis C antibody test   Injection of illicit drug within last 12 months   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 Patient met criteria for sepsis on admission with tachypnea, tachycardia, leukocytosis, concern for untreated bacteremia. Repeat 2D echo which was done showed worsening/severe tricuspid valvular regurgitation, tricuspid valve vegetation attached to posterior leaflet measuring 0.9 cm x 0.5 cm more apparent on this study the last study. Severely dilated right atrial size. Hyperdynamic right ventricular systolic function.CT surgery was consulted 04/28/2020 for possible valve repair however due to ongoing IV drug abuse patient at high risk for reinfection if she were to undergo valve replacement and recommending focusing on drug rehabilitation. Per CT surgery obtaining a TEE will not change surgical candidacy and recommending medical management for now. Repeat blood cultures from 04/29/2020 + for staph aureus. Repeat blood cultures drawn on 05/03/2020 which were negative.  ID following and patient remains on IV vancomycin with estimated end date of therapy planned for 05/25/2020 per ID followed by 1 dose of IV oritavancin. 05/21/2020: Sepsis physiology has resolved.  Complete course of antibiotics.  Acute hypoxemic respiratory distress secondary to right-sided tensionpneumothorax status post chest tube placement: Chest tube now removed. 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 Chest tube removed 04/28/2020  per PCCM. Repeat chest x-ray done showed no recurrent pneumothorax.  Recent CT angiogram of the chest done on 05/07/2020 shows significant improvement in  right pneumothorax. Pulmonary hygiene. Mucinex added due to ongoing cough. Place on Hycodan as needed. Supportive care. 05/21/2020: Patient is not on supplemental oxygen.  Bilateral moderate pleural effusions. CTA chest has demonstrated bilateral moderate pleural effusions. Will monitor and consider thoracentesis, however, the patient's history of tension pneumothorax is concerning for risk of recurrence. previous hospitalist have discussed the patient with Dr. Larena Glassman. He states that in his estimation the pleural effusions are small and do not warrant intervention at this time.  Repeat chest x-ray from yesterday shows possible bulla or small contained pneumothorax but patient remains asymptomatic.  Tachycardia: Likely due to chronic IV abuse of heroin and cocaine and endocarditis. CTA chest negative for PE.  Increased carvedilol to 6.25 p.o. twice daily on 05/15/2020. Tachycardia improving slowly.  Continue current management.  Polysubstance abuse/heroin use: UDS noted at outside hospital to be positive for amphetamines. Patient noted to have multiple admissions for endocarditis, where she has left AMA . Patient seen by psychiatry on 04/27/2020 and is felt patient lacks capacity to make an informed medical decision regarding her medical care and disposition and recommending social work evaluation to contact next of kin no appropriate hospital administrator to help with decision-making process. Continue Suboxone. Was on clonidine taper/detox protocol. Patient wanted to leave AMA again (04/29/2020). Patient however did state she understands the risk of leaving AMA which include death. Was also felt by PCCM that patient lacked capacity poor insight into her current medical situation.Patient with poor insight into her current medical situation. Patient not thinking rationally. Patient has subsequently been IVC'd to finish out treatment for MRSA disseminated infection/endocarditis with worsening  tricuspid regurgitation.Patient has been reassessed by psych who are in agreement that patient lacks capacity. 05/21/2020: Counseled to quit illicit drug use.  Anemiaof chronic disease: Anemia panel consistent with anemia of chronic disease. Status post transfusion 1 unit packed red blood cells. Hemoglobin 9.4 yesterday. 05/21/2020: Hemoglobin is stable.  Hemoglobin today is 9.5 g/dL.  Hypomagnesemia/hypokalemia: Magnesium low again.  Will replace.  Potassium normal.  Recheck in the morning. 05/21/2020: Potassium is 4.4.  Magnesium is 1.6.  Will give 1 g of magnesium IV.  Positive hep C antibody/transaminitis: HCV RNA quant of 2260. Outpatient follow-up with ID.   Back pain: Resolved according to patient.  Allergic rash/contact dermatitis in lower back: Resolved.  DVT prophylaxis: SCD   Code Status: Full Code  Family Communication:  None present at bedside.  Plan of care discussed with patient in length and he verbalized understanding and agreed with it. Patient is from: Home Disposition Plan: Home Barriers to discharge: Need for continuation of IV antibiotics to treat endocarditis  Status is: Inpatient  Remains inpatient appropriate because:Inpatient level of care appropriate due to severity of illness   Dispo: The patient is from: Home              Anticipated d/c is to: Home              Anticipated d/c date is: 06/24/2020              Patient currently is not medically stable to d/c.        Estimated body mass index is 19.22 kg/m as calculated from the following:   Height as of this encounter: 5' 5" (1.651 m).   Weight as of this encounter: 52.4 kg.  Nutritional status:  Consultants:   ID, cardiothoracic surgery  Procedures:   None  Antimicrobials:  Anti-infectives (From admission, onward)   Start     Dose/Rate Route Frequency Ordered Stop   06/01/20 1000  Oritavancin Diphosphate (ORBACTIV) 1,200 mg in dextrose 5 % IVPB  Status:  Discontinued         1,200 mg 333.3 mL/hr over 180 Minutes Intravenous Once 05/18/20 1040 05/18/20 1046   05/31/20 1330  Oritavancin Diphosphate (ORBACTIV) 1,200 mg in dextrose 5 % IVPB  Status:  Discontinued        1,200 mg 333.3 mL/hr over 180 Minutes Intravenous Once 05/18/20 1046 05/18/20 1444   05/25/20 1330  Oritavancin Diphosphate (ORBACTIV) 1,200 mg in dextrose 5 % IVPB     Discontinue     1,200 mg 333.3 mL/hr over 180 Minutes Intravenous Once 05/18/20 1444     05/21/20 1600  vancomycin (VANCOCIN) IVPB 1000 mg/200 mL premix     Discontinue     1,000 mg 200 mL/hr over 60 Minutes Intravenous Every 8 hours 05/21/20 0914 05/25/20 1200   05/07/20 1430  vancomycin (VANCOREADY) IVPB 750 mg/150 mL  Status:  Discontinued        750 mg 150 mL/hr over 60 Minutes Intravenous Every 8 hours 05/07/20 1313 05/21/20 0914   05/01/20 1430  vancomycin (VANCOCIN) IVPB 1000 mg/200 mL premix  Status:  Discontinued        1,000 mg 200 mL/hr over 60 Minutes Intravenous Every 8 hours 05/01/20 0946 05/07/20 1313   04/30/20 0600  vancomycin (VANCOCIN) IVPB 1000 mg/200 mL premix  Status:  Discontinued        1,000 mg 200 mL/hr over 60 Minutes Intravenous Every 8 hours 04/29/20 2318 05/01/20 0947   04/29/20 2330  vancomycin (VANCOREADY) IVPB 500 mg/100 mL  Status:  Discontinued        500 mg 100 mL/hr over 60 Minutes Intravenous Every 8 hours 04/29/20 1648 04/29/20 2315   04/27/20 1100  vancomycin (VANCOREADY) IVPB 500 mg/100 mL  Status:  Discontinued        500 mg 100 mL/hr over 60 Minutes Intravenous Every 8 hours 04/27/20 0210 04/29/20 1648   04/27/20 0300  vancomycin (VANCOCIN) IVPB 1000 mg/200 mL premix        1,000 mg 200 mL/hr over 60 Minutes Intravenous  Once 04/27/20 0203 04/27/20 0423   04/27/20 0300  piperacillin-tazobactam (ZOSYN) IVPB 3.375 g  Status:  Discontinued        3.375 g 12.5 mL/hr over 240 Minutes Intravenous Every 8 hours 04/27/20 0210 04/27/20 0856         Subjective: No fever or chills.     No shortness of breath  No chest pain.    Objective: Vitals:   05/21/20 1913 05/21/20 2113 05/22/20 0527 05/22/20 1155  BP: (!) 98/55 (!) 87/55 96/62 102/64  Pulse: (!) 102 100 (!) 101 (!) 103  Resp: _0 Temp: 98.6 F (37 C) 98.5 F (36.9 C)  99 F (37.2 C)  TempSrc: Oral Oral  Oral  SpO2: 95% 99% 98% 95%  Weight:      Height:        Intake/Output Summary (Last 24 hours) at 05/22/2020 1617 Last data filed at 05/22/2020 1300 Gross per 24 hour  Intake 1275 ml  Output 0 ml  Net 1275 ml   Filed Weights   05/08/20 0625 05/09/20 1801 05/18/20 0500  Weight: 50.4 kg 50.7 kg 52.4 kg    Examination:  General exam: Appears calm and comfortable  Respiratory system: Clear to auscultation. Respiratory effort normal. Cardiovascular system: S1 & S2 heard Gastrointestinal system: Abdomen is nondistended, soft and nontender. No organomegaly or masses felt. Normal bowel sounds heard. Central nervous system: Alert and oriented.  Patient moves all extremities.   Extremities: No leg edema. Data Reviewed: I have personally reviewed following labs and imaging studies  CBC: Recent Labs  Lab 05/18/20 0615 05/21/20 0736  WBC 7.5 7.2  NEUTROABS 3.0 2.8  HGB 9.4* 9.4*  HCT 31.5* 31.1*  MCV 92.1 90.9  PLT 213 053   Basic Metabolic Panel: Recent Labs  Lab 05/16/20 1325 05/18/20 0615 05/20/20 0840 05/21/20 0736 05/22/20 1149  NA  --  135  --  135 135  K  --  3.8  --  4.4 3.9  CL  --  101  --  103 101  CO2  --  23  --  24 26  GLUCOSE  --  113*  --  100* 146*  BUN  --  10  --  9 8  CREATININE  --  0.56  --  0.57 0.59  CALCIUM  --  8.8*  --  8.8* 8.9  MG 1.9 1.5* 1.6* 1.6* 1.6*  PHOS  --   --   --   --  5.1*   GFR: Estimated Creatinine Clearance: 92.8 mL/min (by C-G formula based on SCr of 0.59 mg/dL). Liver Function Tests: Recent Labs  Lab 05/22/20 1149  ALBUMIN 2.5*   No results for input(s): LIPASE, AMYLASE in the last 168 hours. No results for input(s):  AMMONIA in the last 168 hours. Coagulation Profile: No results for input(s): INR, PROTIME in the last 168 hours. Cardiac Enzymes: No results for input(s): CKTOTAL, CKMB, CKMBINDEX, TROPONINI in the last 168 hours. BNP (last 3 results) No results for input(s): PROBNP in the last 8760 hours. HbA1C: No results for input(s): HGBA1C in the last 72 hours. CBG: No results for input(s): GLUCAP in the last 168 hours. Lipid Profile: No results for input(s): CHOL, HDL, LDLCALC, TRIG, CHOLHDL, LDLDIRECT in the last 72 hours. Thyroid Function Tests: No results for input(s): TSH, T4TOTAL, FREET4, T3FREE, THYROIDAB in the last 72 hours. Anemia Panel: No results for input(s): VITAMINB12, FOLATE, FERRITIN, TIBC, IRON, RETICCTPCT in the last 72 hours. Sepsis Labs: No results for input(s): PROCALCITON, LATICACIDVEN in the last 168 hours.  No results found for this or any previous visit (from the past 240 hour(s)).    Radiology Studies: No results found.  Scheduled Meds: . buprenorphine-naloxone  1 tablet Sublingual BID  . carvedilol  6.25 mg Oral BID WC  . dextromethorphan-guaiFENesin  1 tablet Oral BID  . lidocaine  1 patch Transdermal Q24H  . magnesium oxide  400 mg Oral BID  . QUEtiapine  50 mg Oral BID  . rivaroxaban  10 mg Oral Q supper   Continuous Infusions: . sodium chloride 500 mL (05/19/20 2339)  . [START ON 05/25/2020] oritavancin (ORBACTIV) IVPB    . vancomycin 1,000 mg (05/22/20 0815)     LOS: 25 days   Time spent: 25 min   Bonnell Public, MD [facility]  05/22/2020, 4:17 PM   To contact the attending provider between 7A-7P or the covering provider during after hours 7P-7A, please log into the web site [email or web address].

## 2020-05-23 MED ORDER — FLUCONAZOLE 150 MG PO TABS
150.0000 mg | ORAL_TABLET | Freq: Once | ORAL | Status: AC
  Administered 2020-05-23: 150 mg via ORAL
  Filled 2020-05-23: qty 1

## 2020-05-23 MED ORDER — MAGNESIUM SULFATE 2 GM/50ML IV SOLN
2.0000 g | Freq: Once | INTRAVENOUS | Status: AC
  Administered 2020-05-23: 2 g via INTRAVENOUS
  Filled 2020-05-23: qty 50

## 2020-05-23 NOTE — TOC Progression Note (Addendum)
Transition of Care [facility]) - Progression Note    Patient Details  Name: Candice Jordan MRN: [id number] Date of Birth: 10-19-2001  [facility] CM/SW Contact  Okey Dupre Lazaro Arms, LCSW Phone Number: 05/23/2020, 1:54 PM  Clinical Narrative:   IVC paperwork updated today and served by [facility]. This should be the last IVC update as the IV antibiotics should stop 05/25/20.     Expected Discharge Plan and Services - Patient will d/c home once abx treatment has ended.                                               Social Determinants of Health (SDOH) Interventions    Readmission Risk Interventions No flowsheet data found.

## 2020-05-23 NOTE — Progress Notes (Signed)
Patient is complaining of vaginal itching and believes she has a yeast infection. Patient refused to let me assess. MD notified. Orders placed and followed. Will continue to monitor.

## 2020-05-23 NOTE — Progress Notes (Signed)
Occupational Therapy Treatment Patient Details Name: Candice Jordan MRN: [id number] DOB: 2001/08/14 Today's Date: 05/23/2020    History of present illness Patient is a 19 year old female presenting to [facility] ED on 5/25 with reported 1 week of shortness of breath. On arrival to ED CXR revealing of large right sided tension Pneumothorax. Chest Tube placed. 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 No longer requiring chest tube. History of MRSA Bacteremia/Endocarditis. COVID-19. Polysubstance Abuse.    OT comments  On entry @ noon, pt asleep in bed. Pt with disheveled appearance and demonstrates decreased self awareness of need to complete personal hygiene, however able to complete ADL tasks @ modified independent level. Poor eye contact during session, however, improved affect as session progressed. Focus of session on establishing goals and discussing healthy vs unhealthy coping strategies - Pt given homework to complete. Recommend outpatient counseling after DC - pt states she is agreeable to counseling. Will continue to follow acutely.   Follow Up Recommendations  No OT follow up;Supervision - Intermittent    Equipment Recommendations  None recommended by OT    Recommendations for Other Services  Outpatient Substance Abuse Counseling    Precautions / Restrictions         Mobility Bed Mobility Overal bed mobility: Independent                Transfers Overall transfer level: Modified independent                    Balance Overall balance assessment: No apparent balance deficits (not formally assessed)                                         ADL either performed or assessed with clinical judgement   ADL                                         General ADL Comments: overall modified independent with completeing bathing/dressing. Pt with disheveled appearance and  did not initiate need to wash hands after toileting adn combing hair until asked to do so. Pt hair with obvious need of being washed and pt wtih poor self awareness      Vision       Perception     Praxis      Cognition Arousal/Alertness: Lethargic Behavior During Therapy: Flat affect Overall Cognitive Status: No family/caregiver present to determine baseline cognitive functioning Area of Impairment: Safety/judgement;Awareness                               General Comments: Poor eye contact, however as session progressed, pt made better eye contact; Toward end of session, pt smiled at joke        Exercises     Shoulder Instructions       General Comments Discussed healthy vs unhealthy coping strategies in addition to establishing goals. worksheets provided as homework for pt to complete on her own. Pt verbalized understanding.     Pertinent Vitals/ Pain       Pain Assessment: Faces Faces Pain Scale: Hurts a little bit Pain Location: complaining of vaginal discharge Pain Descriptors / Indicators: Discomfort Pain Intervention(s): Limited activity within patient's tolerance;Other (  comment) (Nsg notified)  Home Living                                          Prior Functioning/Environment              Frequency  Min 2X/week        Progress Toward Goals  OT Goals(current goals can now be found in the care plan section)  Progress towards OT goals: Goals met and updated - see care plan  Acute Rehab OT Goals Patient Stated Goal: to go home OT Goal Formulation: With patient Time For Goal Achievement: 06/06/20 Potential to Achieve Goals: Good ADL Goals Additional ADL Goal #3: Pt will establish 3 goals to address IADL tasks  Plan Discharge plan remains appropriate    Co-evaluation                 AM-PAC OT "6 Clicks" Daily Activity     Outcome Measure   Help from another person eating meals?: None Help from another  person taking care of personal grooming?: None Help from another person toileting, which includes using toliet, bedpan, or urinal?: None Help from another person bathing (including washing, rinsing, drying)?: A Little Help from another person to put on and taking off regular upper body clothing?: None Help from another person to put on and taking off regular lower body clothing?: None 6 Click Score: 23    End of Session    OT Visit Diagnosis: Unsteadiness on feet (R26.81);Other symptoms and signs involving cognitive function   Activity Tolerance Patient tolerated treatment well   Patient Left in chair;with call bell/phone within reach;with nursing/sitter in room   Nurse Communication Mobility status;Other (comment) (complaints of vaginal discharge)        Time: 3662-9476 OT Time Calculation (min): 25 min  Charges: OT General Charges $OT Visit: 1 Visit OT Treatments $Self Care/Home Management : 8-22 mins $Therapeutic Activity: 8-22 mins  Maurie Boettcher, OT/L   Acute OT Clinical Specialist Acute Rehabilitation Services Pager [phone] Office [phone]    [facility] 05/23/2020, 2:00 PM

## 2020-05-23 NOTE — Progress Notes (Signed)
PROGRESS NOTE  Candice Jordan WJX:[id number] DOB: Apr 26, 2001 DOA: 04/27/2020 PCP: System, Provider Not In  HPI/Recap of past 24 hours: As per Dr. Joni Fears "19 year old female presents to [facility] ED on 5/25 with reported 1 week of shortness of breath. Patient was found at [address] with oxygen saturation 79%. Reports using Heroin last night. Diagnosed with MRSA Bacteremia/Endocarditis and COVID 03/08/2020 however has left AMA numerous times throughout the month of April. On arrival to ED. CXR revealing of large right sided tension Pneumothorax. Chest Tube placed. 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 She has been IVC'd. IVC was renewed on 05/05/2020.  The patient has had continual tachycardia since admission. Likely due to [place] secondary to chronic cocaine and heroin abuse. CTA chest has ruled out PE, but does demonstrate moderate bilateral pleural effusions.I have discussed the pleural effusions with IR. It seems that they are small and the patient is unlikely to benefit from thoracentesis.  Last blood culture which were positive with MRSA and patient will from 04/29/2020.  Blood culture from 05/03/2020 have been negative.  Patient remains on IV vancomycin per ID and they plan to continue this until 05/25/2020 and provide her 1 dose of IV oritavancin that day which will suffice for 2 weeks of IV antibiotic for MRSA and she can be discharged on that day".  05/23/20: One-to-one in place in her room.  She has no new complaints.  Assessment/Plan: Principal Problem:   Endocarditis of tricuspid valve Active Problems:   MRSA bacteremia   Spontaneous tension pneumothorax   AKI (acute kidney injury) ([place])   Sepsis without acute organ dysfunction (HCC)   Pneumothorax   Status post chest tube  placement (HCC)   Polysubstance abuse (HCC)   Anemia   Hypokalemia   Hypomagnesemia   Tachycardia   Positive hepatitis C antibody test   Injection of illicit drug within last 12 months  Septic shock, improving, secondary to MRSA bacteremia with tricuspid valve endocarditis with septic emboli to the lungs: Patient with multiple admissions and noted to be leaving AMA and as such has remained fairly untreated. Patient met criteria for sepsis on admission with tachypnea, tachycardia, leukocytosis, concern for untreated bacteremia. Repeat 2D echo which was done showed worsening/severe tricuspid valvular regurgitation, tricuspid valve vegetation attached to posterior leaflet measuring 0.9 cm x 0.5 cm more apparent on this study the last study. Severely dilated right atrial size. Hyperdynamic right ventricular systolic function.CT surgery was consulted 04/28/2020 for possible valve repair however due to ongoing IV drug abuse patient at high risk for reinfection if she were to undergo valve replacement and recommending focusing on drug rehabilitation. Per CT surgery obtaining a TEE will not change surgical candidacy and recommending medical management for now. Repeat blood cultures from 04/29/2020 + for staph aureus. Repeat blood cultures drawn on 05/03/2020 which were negative.  ID following and patient remains on IV vancomycin with estimated end date of therapy planned for 05/25/2020 per ID followed by 1 dose of IV oritavancin. 05/21/2020: Sepsis physiology has resolved.  Complete course of antibiotics.  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 Chest tube removed 04/28/2020 per PCCM. Repeat chest  x-ray done showed no recurrent pneumothorax.  Recent CT angiogram of the chest done on  05/07/2020 shows significant improvement in right pneumothorax. Pulmonary hygiene. Mucinex added due to ongoing cough. Place on Hycodan as needed. Supportive care. 05/21/2020: Patient is not on supplemental oxygen.  Bilateral moderate pleural effusions. CTA chest has demonstrated bilateral moderate pleural effusions. Will monitor and consider thoracentesis, however, the patient's history of tension pneumothorax is concerning for risk of recurrence. previous hospitalist have discussed the patient with Dr. Larena Glassman. He states that in his estimation the pleural effusions are small and do not warrant intervention at this time.  Repeat chest x-ray from yesterday shows possible bulla or small contained pneumothorax but patient remains asymptomatic.  Tachycardia: Likely due to chronic IV abuse of heroin and cocaine and endocarditis. CTA chest negative for PE.  Increased carvedilol to 6.25 p.o. twice daily on 05/15/2020. Tachycardia improving slowly.  Continue current management.  Polysubstance abuse/heroin use: UDS noted at outside hospital to be positive for amphetamines.Patient noted to have multiple admissions for endocarditis, where she has left AMA . Patient seen by psychiatry on 04/27/2020 and is felt patient lacks capacity to make an informed medical decision regarding her medical care and disposition and recommending social work evaluation to contact next of kin no appropriate hospital administrator to help with decision-making process. Continue Suboxone. Was on clonidine taper/detox protocol. Patient wanted to leave AMA again (04/29/2020). Patient however did state she understands the risk of leaving AMA which include death. Was also felt by PCCM that patient lacked capacity poor insight into her current medical situation.Patient with poor insight into her current medical situation. Patient not thinking rationally. Patient has subsequently been IVC'd to finish out treatment for MRSA disseminated  infection/endocarditis with worsening tricuspid regurgitation.Patient has been reassessed by psych who are in agreement that patient lacks capacity. 05/21/2020: Counseled to quit illicit drug use.  Anemiaof chronic disease: Anemia panel consistent with anemia of chronic disease. Status post transfusion 1 unit packed red blood cells. Hemoglobin 9.4 yesterday. 05/21/2020: Hemoglobin is stable.  Hemoglobin today is 9.5 g/dL.  Hypomagnesemia/hypokalemia: Magnesium low again.  Will replace.  Potassium normal.  Recheck in the morning. 05/21/2020: Potassium is 4.4.  Magnesium is 1.6.  Will give 1 g of magnesium IV.  Positive hep C antibody/transaminitis: HCV RNA quant of 2260. Outpatient follow-up with ID.   Back pain: Resolved according to patient.  Allergic rash/contact dermatitis in lower back: Resolved.  DVT prophylaxis: SCD   Code Status: Full Code  Family Communication:  None present at bedside.  Plan of care discussed with patient in length and he verbalized understanding and agreed with it. Patient is from: Home Disposition Plan: Home Barriers to discharge: Need for continuation of IV antibiotics to treat endocarditis  Status is: Inpatient  Remains inpatient appropriate because:Inpatient level of care appropriate due to severity of illness   Dispo: The patient is from: Home  Anticipated d/c is to: Home  Anticipated d/c date is: 05/25/2020  Patient currently is not medically stable to d/c.     Estimated body mass index is 19.22 kg/m as calculated from the following:   Height as of this encounter: 5' 5"  (1.651 m).   Weight as of this encounter: 52.4 kg.    Nutritional status:  Consultants:   ID, cardiothoracic surgery  Procedures:   None  Antimicrobials:             Anti-infectives (From admission, onward)     Start     Dose/Rate  Route Frequency Ordered Stop   06/01/20 1000  Oritavancin Diphosphate  (ORBACTIV) 1,200 mg in dextrose 5 % IVPB  Status:  Discontinued        1,200 mg 333.3 mL/hr over 180 Minutes Intravenous Once 05/18/20 1040 05/18/20 1046   05/31/20 1330  Oritavancin Diphosphate (ORBACTIV) 1,200 mg in dextrose 5 % IVPB  Status:  Discontinued        1,200 mg 333.3 mL/hr over 180 Minutes Intravenous Once 05/18/20 1046 05/18/20 1444   05/25/20 1330  Oritavancin Diphosphate (ORBACTIV) 1,200 mg in dextrose 5 % IVPB     Discontinue     1,200 mg 333.3 mL/hr over 180 Minutes Intravenous Once 05/18/20 1444     05/21/20 1600  vancomycin (VANCOCIN) IVPB 1000 mg/200 mL premix     Discontinue     1,000 mg 200 mL/hr over 60 Minutes Intravenous Every 8 hours 05/21/20 0914 05/25/20 1200   05/07/20 1430  vancomycin (VANCOREADY) IVPB 750 mg/150 mL  Status:  Discontinued        750 mg 150 mL/hr over 60 Minutes Intravenous Every 8 hours 05/07/20 1313 05/21/20 0914   05/01/20 1430  vancomycin (VANCOCIN) IVPB 1000 mg/200 mL premix  Status:  Discontinued        1,000 mg 200 mL/hr over 60 Minutes Intravenous Every 8 hours 05/01/20 0946 05/07/20 1313   04/30/20 0600  vancomycin (VANCOCIN) IVPB 1000 mg/200 mL premix  Status:  Discontinued        1,000 mg 200 mL/hr over 60 Minutes Intravenous Every 8 hours 04/29/20 2318 05/01/20 0947   04/29/20 2330  vancomycin (VANCOREADY) IVPB 500 mg/100 mL  Status:  Discontinued        500 mg 100 mL/hr over 60 Minutes Intravenous Every 8 hours 04/29/20 1648 04/29/20 2315   04/27/20 1100  vancomycin (VANCOREADY) IVPB 500 mg/100 mL  Status:  Discontinued        500 mg 100 mL/hr over 60 Minutes Intravenous Every 8 hours 04/27/20 0210 04/29/20 1648   04/27/20 0300  vancomycin (VANCOCIN) IVPB 1000 mg/200 mL premix        1,000 mg 200 mL/hr over 60 Minutes Intravenous  Once 04/27/20 0203 04/27/20 0423   04/27/20 0300  piperacillin-tazobactam (ZOSYN) IVPB 3.375 g  Status:  Discontinued        3.375 g 12.5 mL/hr over 240 Minutes Intravenous Every  8 hours 04/27/20 0210 04/27/20 0856          Objective: Vitals:   05/22/20 2110 05/23/20 0300 05/23/20 0632 05/23/20 0640  BP: (!) 94/56 94/74 (!) 76/55 98/64  Pulse: 98 99 (!) 101   Resp:      Temp: 97.7 F (36.5 C) 97.6 F (36.4 C) 98.1 F (36.7 C)   TempSrc: Oral Oral Oral   SpO2: 98% 99% 99%   Weight:      Height:        Intake/Output Summary (Last 24 hours) at 05/23/2020 0949 Last data filed at 05/23/2020 0600 Gross per 24 hour  Intake 760 ml  Output 0 ml  Net 760 ml   Filed Weights   05/08/20 0625 05/09/20 1801 05/18/20 0500  Weight: 50.4 kg 50.7 kg 52.4 kg    Exam:  . General: 19 y.o. year-old female well developed well nourished in no acute distress.  Alert and oriented x3. . Cardiovascular: Regular rate and rhythm with no rubs or gallops.  No thyromegaly or JVD noted.   Marland Kitchen Respiratory: Clear to auscultation with no wheezes or rales.  Good inspiratory effort. . Abdomen: Soft nontender nondistended with normal bowel sounds x4 quadrants. . Musculoskeletal: No lower extremity edema. 2/4 pulses in all 4 extremities. Marland Kitchen Psychiatry: Mood is appropriate for condition and setting   Data Reviewed: CBC: Recent Labs  Lab 05/18/20 0615 05/21/20 0736  WBC 7.5 7.2  NEUTROABS 3.0 2.8  HGB 9.4* 9.4*  HCT 31.5* 31.1*  MCV 92.1 90.9  PLT 213 269   Basic Metabolic Panel: Recent Labs  Lab 05/16/20 1325 05/18/20 0615 05/20/20 0840 05/21/20 0736 05/22/20 1149  NA  --  135  --  135 135  K  --  3.8  --  4.4 3.9  CL  --  101  --  103 101  CO2  --  23  --  24 26  GLUCOSE  --  113*  --  100* 146*  BUN  --  10  --  9 8  CREATININE  --  0.56  --  0.57 0.59  CALCIUM  --  8.8*  --  8.8* 8.9  MG 1.9 1.5* 1.6* 1.6* 1.6*  PHOS  --   --   --   --  5.1*   GFR: Estimated Creatinine Clearance: 92.8 mL/min (by C-G formula based on SCr of 0.59 mg/dL). Liver Function Tests: Recent Labs  Lab 05/22/20 1149  ALBUMIN 2.5*   No results for input(s): LIPASE, AMYLASE in the  last 168 hours. No results for input(s): AMMONIA in the last 168 hours. Coagulation Profile: No results for input(s): INR, PROTIME in the last 168 hours. Cardiac Enzymes: No results for input(s): CKTOTAL, CKMB, CKMBINDEX, TROPONINI in the last 168 hours. BNP (last 3 results) No results for input(s): PROBNP in the last 8760 hours. HbA1C: No results for input(s): HGBA1C in the last 72 hours. CBG: No results for input(s): GLUCAP in the last 168 hours. Lipid Profile: No results for input(s): CHOL, HDL, LDLCALC, TRIG, CHOLHDL, LDLDIRECT in the last 72 hours. Thyroid Function Tests: No results for input(s): TSH, T4TOTAL, FREET4, T3FREE, THYROIDAB in the last 72 hours. Anemia Panel: No results for input(s): VITAMINB12, FOLATE, FERRITIN, TIBC, IRON, RETICCTPCT in the last 72 hours. Urine analysis:    Component Value Date/Time   COLORURINE YELLOW 04/27/2020 1604   APPEARANCEUR CLOUDY (A) 04/27/2020 1604   LABSPEC 1.033 (H) 04/27/2020 1604   PHURINE 5.0 04/27/2020 1604   GLUCOSEU 150 (A) 04/27/2020 1604   HGBUR LARGE (A) 04/27/2020 1604   [place] 04/27/2020 [place] 04/27/2020 1604   PROTEINUR 30 (A) 04/27/2020 1604   NITRITE NEGATIVE 04/27/2020 1604   LEUKOCYTESUR LARGE (A) 04/27/2020 1604   Sepsis Labs: @LABRCNTIP (procalcitonin:4,lacticidven:4)  )No results found for this or any previous visit (from the past 240 hour(s)).    Studies: No results found.  Scheduled Meds: . buprenorphine-naloxone  1 tablet Sublingual BID  . carvedilol  6.25 mg Oral BID WC  . dextromethorphan-guaiFENesin  1 tablet Oral BID  . lidocaine  1 patch Transdermal Q24H  . magnesium oxide  400 mg Oral BID  . QUEtiapine  50 mg Oral BID  . rivaroxaban  10 mg Oral Q supper    Continuous Infusions: . sodium chloride 500 mL (05/19/20 2339)  . [START ON 05/25/2020] oritavancin (ORBACTIV) IVPB    . vancomycin 1,000 mg (05/22/20 2337)     LOS: 26 days     Kayleen Memos,  MD [facility] Pager [phone]  If 7PM-7AM, please contact night-coverage [email or web address] Password [facility] 05/23/2020, 9:49 AM

## 2020-05-24 LAB — BASIC METABOLIC PANEL
Anion gap: 9 (ref 5–15)
BUN: 8 mg/dL (ref 6–20)
CO2: 25 mmol/L (ref 22–32)
Calcium: 9.1 mg/dL (ref 8.9–10.3)
Chloride: 102 mmol/L (ref 98–111)
Creatinine, Ser: 0.58 mg/dL (ref 0.44–1.00)
GFR calc Af Amer: >60 mL/min (ref >60)
GFR calc non Af Amer: >60 mL/min (ref >60)
Glucose, Bld: 102 mg/dL — ABNORMAL HIGH (ref 70–99)
Potassium: 4.6 mmol/L (ref 3.5–5.1)
Sodium: 136 mmol/L (ref 135–145)

## 2020-05-24 LAB — CBC WITH DIFFERENTIAL/PLATELET
Abs Immature Granulocytes: 0.02 10*3/uL (ref 0.00–0.07)
Basophils Absolute: 0 10*3/uL (ref 0.0–0.1)
Basophils Relative: 1 %
Eosinophils Absolute: 0.2 10*3/uL (ref 0.0–0.5)
Eosinophils Relative: 3 %
HCT: 33.4 % — ABNORMAL LOW (ref 36.0–46.0)
Hemoglobin: 10.3 g/dL — ABNORMAL LOW (ref 12.0–15.0)
Immature Granulocytes: 0 %
Lymphocytes Relative: 50 %
Lymphs Abs: 3.6 10*3/uL (ref 0.7–4.0)
MCH: 28 pg (ref 26.0–34.0)
MCHC: 30.8 g/dL (ref 30.0–36.0)
MCV: 90.8 fL (ref 80.0–100.0)
Monocytes Absolute: 0.7 10*3/uL (ref 0.1–1.0)
Monocytes Relative: 10 %
Neutro Abs: 2.6 10*3/uL (ref 1.7–7.7)
Neutrophils Relative %: 36 %
Platelets: 149 10*3/uL — ABNORMAL LOW (ref 150–400)
RBC: 3.68 MIL/uL — ABNORMAL LOW (ref 3.87–5.11)
RDW: 16.6 % — ABNORMAL HIGH (ref 11.5–15.5)
WBC: 7.2 10*3/uL (ref 4.0–10.5)
nRBC: 0 % (ref 0.0–0.2)

## 2020-05-24 LAB — MAGNESIUM: Magnesium: 1.8 mg/dL (ref 1.7–2.4)

## 2020-05-24 MED ORDER — POLYETHYLENE GLYCOL 3350 17 G PO PACK: 17.0000 g | PACK | Freq: Every day | ORAL | 0 refills | Status: AC | PRN

## 2020-05-24 MED ORDER — RIVAROXABAN 10 MG PO TABS: 10.0000 mg | ORAL_TABLET | Freq: Every day | ORAL | 1 refills | Status: DC

## 2020-05-24 MED ORDER — QUETIAPINE FUMARATE 50 MG PO TABS: 50.0000 mg | ORAL_TABLET | Freq: Two times a day (BID) | ORAL | 0 refills | Status: AC

## 2020-05-24 MED ORDER — BUPRENORPHINE HCL-NALOXONE HCL 8-2 MG SL SUBL: 1.0000 | SUBLINGUAL_TABLET | Freq: Two times a day (BID) | SUBLINGUAL | 0 refills | Status: AC

## 2020-05-24 MED ORDER — CARVEDILOL 6.25 MG PO TABS: 6.2500 mg | ORAL_TABLET | Freq: Two times a day (BID) | ORAL | 0 refills | Status: AC

## 2020-05-24 NOTE — Progress Notes (Signed)
PROGRESS NOTE  Candice Jordan TLX:[id number] DOB: 07-03-2001 DOA: 04/27/2020 PCP: System, Provider Not In  HPI/Recap of past 24 hours: As per Dr. Joni Fears "19 year old female presents to [facility] ED on 5/25 with reported 1 week of shortness of breath. Patient was found at [address] with oxygen saturation 79%. Reports using Heroin last night. Diagnosed with MRSA Bacteremia/Endocarditis and COVID 03/08/2020 however has left AMA numerous times throughout the month of April. On arrival to ED. CXR revealing of large right sided tension Pneumothorax. Chest Tube placed. 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 She has been IVC'd. IVC was renewed on 05/05/2020.  The patient has had continual tachycardia since admission. Likely due to Regal secondary to chronic cocaine and heroin abuse. CTA chest has ruled out PE, but does demonstrate moderate bilateral pleural effusions.I have discussed the pleural effusions with IR. It seems that they are small and the patient is unlikely to benefit from thoracentesis.  Last blood culture which were positive with MRSA and patient will from 04/29/2020.  Blood culture from 05/03/2020 have been negative.  Patient remains on IV vancomycin per ID and they plan to continue this until 05/25/2020 and provide her 1 dose of IV oritavancin that day which will suffice for 2 weeks of IV antibiotic for MRSA and she can be discharged on that day".  05/24/20: One-to-one in place in her room.  She reports greenish vaginal discharge. Will obtain wet prep.  Was treated yesterday for yeast infection with 1 dose of fluconazole.  Assessment/Plan: Principal Problem:   Endocarditis of tricuspid valve Active Problems:   MRSA bacteremia   Spontaneous tension pneumothorax   AKI (acute kidney  injury) ([place])   Sepsis without acute organ dysfunction (HCC)   Pneumothorax   Status post chest tube placement (HCC)   Polysubstance abuse (HCC)   Anemia   Hypokalemia   Hypomagnesemia   Tachycardia   Positive hepatitis C antibody test   Injection of illicit drug within last 12 months  Septic shock, improving, secondary to MRSA bacteremia with tricuspid valve endocarditis with septic emboli to the lungs: Patient with multiple admissions and noted to be leaving AMA and as such has remained fairly untreated. Patient met criteria for sepsis on admission with tachypnea, tachycardia, leukocytosis, concern for untreated bacteremia. Repeat 2D echo which was done showed worsening/severe tricuspid valvular regurgitation, tricuspid valve vegetation attached to posterior leaflet measuring 0.9 cm x 0.5 cm more apparent on this study the last study. Severely dilated right atrial size. Hyperdynamic right ventricular systolic function.CT surgery was consulted 04/28/2020 for possible valve repair however due to ongoing IV drug abuse patient at high risk for reinfection if she were to undergo valve replacement and recommending focusing on drug rehabilitation. Per CT surgery obtaining a TEE will not change surgical candidacy and recommending medical management for now. Repeat blood cultures from 04/29/2020 + for staph aureus. Repeat blood cultures drawn on 05/03/2020 which were negative.  ID following and patient remains on IV vancomycin with estimated end date of therapy planned for 05/25/2020 per ID followed by 1 dose of IV oritavancin. 05/21/2020: Sepsis physiology has resolved.  Complete course of antibiotics.  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 Chest tube removed 04/28/2020 per PCCM. Repeat chest x-ray done showed no recurrent pneumothorax.  Recent CT angiogram of the chest done on 05/07/2020 shows significant improvement in right pneumothorax. Pulmonary hygiene. Mucinex added due to ongoing cough. Place on Hycodan as needed. Supportive care. 05/21/2020: Patient is not on supplemental oxygen.  Bilateral moderate pleural effusions. CTA chest has demonstrated bilateral moderate pleural effusions. Will monitor and consider thoracentesis, however, the patient's history of tension pneumothorax is concerning for risk of recurrence. previous hospitalist have discussed the patient with Dr. Larena Glassman. He states that in his estimation the pleural effusions are small and do not warrant intervention at this time.  Repeat chest x-ray from yesterday shows possible bulla or small contained pneumothorax but patient remains asymptomatic.  Resolved transient Tachycardia: Likely due to chronic IV abuse of heroin and cocaine and endocarditis. CTA chest negative for PE.  Increased carvedilol to 6.25 p.o. twice daily on 05/15/2020. Tachycardia improving slowly.  Continue current management.  Polysubstance abuse/heroin use: UDS noted at outside hospital to be positive for amphetamines.Patient noted to have multiple admissions for endocarditis, where she has left AMA . Patient seen by psychiatry on 04/27/2020 and is felt patient lacks capacity to make an informed medical decision regarding her medical care and disposition and recommending social work evaluation to contact next of kin no appropriate hospital administrator to help with decision-making process. Continue Suboxone. Was on clonidine taper/detox protocol. Patient wanted to leave AMA again (04/29/2020). Patient however did state she understands the risk of leaving AMA which  include death. Was also felt by PCCM that patient lacked capacity poor insight into her current medical situation.Patient with poor insight into her current medical situation. Patient not thinking rationally. Patient has subsequently been IVC'd to finish out treatment for MRSA disseminated infection/endocarditis with worsening tricuspid regurgitation.Patient has been reassessed by psych who are in agreement that patient lacks capacity. 05/21/2020: Counseled to quit illicit drug use.  Anemiaof chronic disease: Anemia panel consistent with anemia of chronic disease. Status post transfusion 1 unit packed red blood cells. Hemoglobin 9.4 yesterday. 05/21/2020: Hemoglobin is stable.  Hemoglobin today is 9.5 g/dL.  Hypomagnesemia/hypokalemia: Magnesium low again.  Will replace.  Potassium normal.  Recheck in the morning. 05/21/2020: Potassium is 4.4.  Magnesium is 1.6.  Will give 1 g of magnesium IV.  Positive hep C antibody/transaminitis: HCV RNA quant of 2260. Outpatient follow-up with ID.   Back pain: Resolved according to patient.  Allergic rash/contact dermatitis in lower back: Resolved.  DVT prophylaxis: Xarelto   Code Status: Full Code  Family Communication:  None present at bedside.  Plan of care discussed with patient in length and he verbalized understanding and agreed with it. Patient is from: Home Disposition Plan: Home Barriers to discharge: Need for continuation of IV antibiotics to treat endocarditis  Status is: Inpatient  Remains inpatient appropriate because:Inpatient level of care appropriate due to severity of illness   Dispo: The patient is from: Home  Anticipated d/c is to: Home  Anticipated d/c date is: 05/25/2020  Patient currently is not medically stable to d/c.     Estimated body mass index is 19.22 kg/m as calculated from the following:   Height as of this encounter: 5' 5"  (1.651 m).   Weight as of this  encounter: 52.4 kg.    Nutritional  status:  Consultants:   ID, cardiothoracic surgery  Procedures:   None  Antimicrobials:             Anti-infectives (From admission, onward)     Start     Dose/Rate Route Frequency Ordered Stop   06/01/20 1000  Oritavancin Diphosphate (ORBACTIV) 1,200 mg in dextrose 5 % IVPB  Status:  Discontinued        1,200 mg 333.3 mL/hr over 180 Minutes Intravenous Once 05/18/20 1040 05/18/20 1046   05/31/20 1330  Oritavancin Diphosphate (ORBACTIV) 1,200 mg in dextrose 5 % IVPB  Status:  Discontinued        1,200 mg 333.3 mL/hr over 180 Minutes Intravenous Once 05/18/20 1046 05/18/20 1444   05/25/20 1330  Oritavancin Diphosphate (ORBACTIV) 1,200 mg in dextrose 5 % IVPB     Discontinue     1,200 mg 333.3 mL/hr over 180 Minutes Intravenous Once 05/18/20 1444     05/21/20 1600  vancomycin (VANCOCIN) IVPB 1000 mg/200 mL premix     Discontinue     1,000 mg 200 mL/hr over 60 Minutes Intravenous Every 8 hours 05/21/20 0914 05/25/20 1200   05/07/20 1430  vancomycin (VANCOREADY) IVPB 750 mg/150 mL  Status:  Discontinued        750 mg 150 mL/hr over 60 Minutes Intravenous Every 8 hours 05/07/20 1313 05/21/20 0914   05/01/20 1430  vancomycin (VANCOCIN) IVPB 1000 mg/200 mL premix  Status:  Discontinued        1,000 mg 200 mL/hr over 60 Minutes Intravenous Every 8 hours 05/01/20 0946 05/07/20 1313   04/30/20 0600  vancomycin (VANCOCIN) IVPB 1000 mg/200 mL premix  Status:  Discontinued        1,000 mg 200 mL/hr over 60 Minutes Intravenous Every 8 hours 04/29/20 2318 05/01/20 0947   04/29/20 2330  vancomycin (VANCOREADY) IVPB 500 mg/100 mL  Status:  Discontinued        500 mg 100 mL/hr over 60 Minutes Intravenous Every 8 hours 04/29/20 1648 04/29/20 2315   04/27/20 1100  vancomycin (VANCOREADY) IVPB 500 mg/100 mL  Status:  Discontinued        500 mg 100 mL/hr over 60 Minutes Intravenous Every 8 hours 04/27/20 0210 04/29/20 1648    04/27/20 0300  vancomycin (VANCOCIN) IVPB 1000 mg/200 mL premix        1,000 mg 200 mL/hr over 60 Minutes Intravenous  Once 04/27/20 0203 04/27/20 0423   04/27/20 0300  piperacillin-tazobactam (ZOSYN) IVPB 3.375 g  Status:  Discontinued        3.375 g 12.5 mL/hr over 240 Minutes Intravenous Every 8 hours 04/27/20 0210 04/27/20 0856          Objective: Vitals:   05/23/20 2220 05/24/20 0455 05/24/20 0725 05/24/20 1017  BP: 132/80 99/62 96/65  95/62  Pulse: 93 (!) 101 99 99  Resp: 19 18 16 17   Temp: 98.2 F (36.8 C) 98.2 F (36.8 C) 98.8 F (37.1 C) 98.2 F (36.8 C)  TempSrc: Oral Oral Oral Oral  SpO2: 99% 98% 96% 97%  Weight:      Height:        Intake/Output Summary (Last 24 hours) at 05/24/2020 1452 Last data filed at 05/24/2020 1037 Gross per 24 hour  Intake 444.83 ml  Output 0 ml  Net 444.83 ml   Filed Weights   05/08/20 0625 05/09/20 1801 05/18/20 0500  Weight: 50.4 kg 50.7 kg 52.4 kg    Exam:  . General: 19 y.o. year-old female well-developed  well-nourished no distress. Alert oriented x3.  . Cardiovascular: Regular rate and rhythm no rubs or gallops.  Marland Kitchen Respiratory: Clear to auscultation no wheezes or rales. . Abdomen: Soft nontender nondistended with normal bowel sounds x4 quadrants. . Musculoskeletal: No lower extremity edema. 2/4 pulses in all 4 extremities. Marland Kitchen Psychiatry: Mood is appropriate for condition and setting   Data Reviewed: CBC: Recent Labs  Lab 05/18/20 0615 05/21/20 0736 05/24/20 0439  WBC 7.5 7.2 7.2  NEUTROABS 3.0 2.8 2.6  HGB 9.4* 9.4* 10.3*  HCT 31.5* 31.1* 33.4*  MCV 92.1 90.9 90.8  PLT 213 176 161*   Basic Metabolic Panel: Recent Labs  Lab 05/18/20 0615 05/20/20 0840 05/21/20 0736 05/22/20 1149 05/24/20 0439  NA 135  --  135 135 136  K 3.8  --  4.4 3.9 4.6  CL 101  --  103 101 102  CO2 23  --  24 26 25   GLUCOSE 113*  --  100* 146* 102*  BUN 10  --  9 8 8   CREATININE 0.56  --  0.57 0.59 0.58  CALCIUM 8.8*  --   8.8* 8.9 9.1  MG 1.5* 1.6* 1.6* 1.6* 1.8  PHOS  --   --   --  5.1*  --    GFR: Estimated Creatinine Clearance: 92.8 mL/min (by C-G formula based on SCr of 0.58 mg/dL). Liver Function Tests: Recent Labs  Lab 05/22/20 1149  ALBUMIN 2.5*   No results for input(s): LIPASE, AMYLASE in the last 168 hours. No results for input(s): AMMONIA in the last 168 hours. Coagulation Profile: No results for input(s): INR, PROTIME in the last 168 hours. Cardiac Enzymes: No results for input(s): CKTOTAL, CKMB, CKMBINDEX, TROPONINI in the last 168 hours. BNP (last 3 results) No results for input(s): PROBNP in the last 8760 hours. HbA1C: No results for input(s): HGBA1C in the last 72 hours. CBG: No results for input(s): GLUCAP in the last 168 hours. Lipid Profile: No results for input(s): CHOL, HDL, LDLCALC, TRIG, CHOLHDL, LDLDIRECT in the last 72 hours. Thyroid Function Tests: No results for input(s): TSH, T4TOTAL, FREET4, T3FREE, THYROIDAB in the last 72 hours. Anemia Panel: No results for input(s): VITAMINB12, FOLATE, FERRITIN, TIBC, IRON, RETICCTPCT in the last 72 hours. Urine analysis:    Component Value Date/Time   COLORURINE YELLOW 04/27/2020 1604   APPEARANCEUR CLOUDY (A) 04/27/2020 1604   LABSPEC 1.033 (H) 04/27/2020 1604   PHURINE 5.0 04/27/2020 1604   GLUCOSEU 150 (A) 04/27/2020 1604   HGBUR LARGE (A) 04/27/2020 1604   BILIRUBINUR NEGATIVE 04/27/2020 [place] 04/27/2020 1604   PROTEINUR 30 (A) 04/27/2020 1604   NITRITE NEGATIVE 04/27/2020 1604   LEUKOCYTESUR LARGE (A) 04/27/2020 1604   Sepsis Labs: @LABRCNTIP (procalcitonin:4,lacticidven:4)  )No results found for this or any previous visit (from the past 240 hour(s)).    Studies: No results found.  Scheduled Meds: . buprenorphine-naloxone  1 tablet Sublingual BID  . carvedilol  6.25 mg Oral BID WC  . dextromethorphan-guaiFENesin  1 tablet Oral BID  . lidocaine  1 patch Transdermal Q24H  . magnesium  oxide  400 mg Oral BID  . QUEtiapine  50 mg Oral BID  . rivaroxaban  10 mg Oral Q supper    Continuous Infusions: . sodium chloride 500 mL (05/19/20 2339)  . [START ON 05/25/2020] oritavancin (ORBACTIV) IVPB    . vancomycin 1,000 mg (05/24/20 0940)     LOS: 27 days     Kayleen Memos, MD [facility]  Pager [phone]  If 7PM-7AM, please contact night-coverage [email or web address] Password [facility] 05/24/2020, 2:52 PM

## 2020-05-24 NOTE — Discharge Instructions (Signed)
MRSA Infection, Diagnosis, Adult Methicillin-resistant Staphylococcus aureus (MRSA) infection is caused by bacteria called Staphylococcus aureus, or staph, that no longer respond to common antibiotic medicines (drug-resistant bacteria). MRSA infection can be hard to treat. Most of the time, MRSA can be on the skin or in the nose without causing problems (colonized). However, if MRSA enters the body through a cut, a sore, or an invasive medical device, it can cause a serious infection. What are the causes? This condition is caused by staph bacteria. Illness may develop after exposure to the bacteria through:  Skin-to-skin contact with someone who is infected with MRSA.  Touching surfaces that have the bacteria on them.  Having a procedure or using equipment that allows MRSA to enter the body.  Having MRSA that lives on your skin and then enters your body through: ? A cut or scratch. ? A surgery or procedure. ? The use of a medical device. Contact with the bacteria may occur:  During a stay in a hospital, rehabilitation facility, nursing home, or other health care facility (health care-associated MRSA).  In daily activities where there is close contact with others, such as sports, child care centers, or at home (community-associated MRSA). What increases the risk? You are more likely to develop this condition if you:  Have a surgery or procedure.  Have an IV or a thin tube (catheter) placed in your body.  Are elderly.  Are on kidney dialysis.  Have recently taken an antibiotic medicine.  Live in a long-term care facility.  Have a chronic wound or skin ulcer.  Have a weak body defense system (immune system).  Play sports that involve skin-to-skin contact.  Live in a crowded place, like a dormitory or military barracks.  Share towels, razors, or sports equipment with other people.  Have a history of MRSA infection or colonization. What are the signs or symptoms? Symptoms  of this condition depend on the area that is affected. Symptoms may include:  A pus-filled pimple or boil.  Pus that drains from your skin.  A sore (abscess) under your skin or somewhere in your body.  Fever with or without chills.  Difficulty breathing.  Coughing up blood.  Redness, warmth, swelling, or pain in the affected area. How is this diagnosed? This condition may be diagnosed based on:  A physical exam.  Your medical history.  Taking a sample from the infected area and growing it in a lab (culture). You may also have other tests, including:  Imaging tests, such as X-rays, a CT scan, or an MRI.  Lab tests, such as blood, urine, or phlegm (sputum) tests. You skin or nose may be swabbed when you are admitted to a health care facility for a procedure. This is to screen for MRSA. How is this treated? Treatment depends on the type of MRSA infection you have and how severe, deep, or extensive it is. Treatment may include:  Antibiotic medicines.  Surgery to drain pus from the infected area. Severe infections may require a hospital stay. Follow these instructions at home: Medicines  Take over-the-counter and prescription medicines only as told by your health care provider.  If you were prescribed an antibiotic medicine, use it as told by your health care provider. Do not stop using the antibiotic even if you start to feel better. Prevention Follow these instructions to avoid spreading the infection to others:  Wash your hands frequently with soap and water. If soap and water are not available, use an alcohol-based hand sanitizer.    Avoid close contact with those around you as much as possible. Do not use towels, razors, toothbrushes, bedding, or other items that will be used by others.  Wash towels, bedding, and clothes in the washing machine with detergent and hot water. Dry them in a hot dryer.  Clean surfaces regularly to remove germs (disinfection). Use products  or solutions that contain bleach. Make sure you disinfect bathroom surfaces, food preparation areas, exercise equipment, and doorknobs.  General instructions  If you have a wound, follow instructions from your health care provider about how to take care of your wound. ? Do not pick at scabs. ? Do not try to drain any infection sites or pimples.  Tell all your health care providers that you have MRSA, or if you have ever had a MRSA infection.  Keep all follow-up visits as told by your health care provider. This is important. Contact a health care provider if you:  Do not get better.  Have symptoms that get worse.  Have new symptoms. Get help right away if you have:  Nausea or vomiting, or if you cannot take medicine without vomiting.  Trouble breathing.  Chest pain. These symptoms may represent a serious problem that is an emergency. Do not wait to see if the symptoms will go away. Get medical help right away. Call your local emergency services (911 in the [place] Do not drive yourself to the hospital. Summary  MRSA infection is caused by bacteria called Staphylococcus aureus, or staph, that no longer respond to common antibiotic medicines.  Treatment for this condition depends on the type of MRSA infection you have and how severe, deep, and extensive it is.  If you were prescribed an antibiotic medicine, use it as told by your health care provider. Do not stop using the antibiotic even if you start to feel better.  Follow instructions from your health care provider to avoid spreading the infection to others. This information is not intended to replace advice given to you by your health care provider. Make sure you discuss any questions you have with your health care provider. Document Revised: 02/05/2019 Document Reviewed: 02/06/2019 Elsevier Patient Education  2020 [employer] Endocarditis  Endocarditis is an infection of the heart valves or an infection of the inner layer  of the heart (endocardium). Endocarditis can cause growths on the heart valves or inside the heart. Over time, these growths can destroy heart tissue and cause heart failure or problems with the heart rhythm. They can also cause a stroke if they break away and block an artery in the brain. Early treatment offers the best chance for curing endocarditis and preventing complications. What are the causes? This condition may be caused by:  Germs that normally live in or on your body. The germs that most commonly cause endocarditis are bacteria.  Fungus.  Cancer.  Connective tissue disease. What increases the risk? This condition is more likely to develop in people who have:  A heart defect.  Artificial (prosthetic) heart valves.  An abnormal or damaged heart valve.  A history of endocarditis.  IV drug abuse. Having certain procedures may also increase the risk of germs getting into the heart or bloodstream. What are the signs or symptoms? Symptoms of this condition may start suddenly, or they may start slowly and gradually get worse. Symptoms include:  Fever.  Chills.  Night sweats.  Muscle aches.  Fatigue.  Weakness.  Shortness of breath.  Chest pain.  Blood spots in the eyes.  Bleeding  under the fingernails or toenails.  Painless red spots on the palms.  Painful lumps in the fingertips or toes.  Swelling in the feet or ankles. How is this diagnosed? This condition may be diagnosed based on:  A physical exam. Your health care provider will listen to your heart to check for abnormal heart sounds (murmur). He or she may also use a scope to check for bleeding at the back of your eyes (retinas).  Tests. They may include: ? Blood tests to look for the germs that cause endocarditis. ? Imaging tests. These include a chest X-ray, CT scan, or echocardiogram. A type of echocardiogram called a transesophageal echocardiogram may be done to look at heart valves more  closely. How is this treated? Treatment for this condition depends on the cause of the endocarditis. Treatment may include:  Antibiotic medicines. These may be given through an IV line or taken by mouth. You may need to be on more than one antibiotic medicine.  Surgery to replace your heart valve. You may need surgery if: ? The endocarditis does not respond to treatment. ? You develop complications. ? Your heart valve is severely damaged. Follow these instructions at home: Medicines  Take over-the-counter and prescription medicines only as told by your health care provider.  If you were prescribed an antibiotic medicine, take it as told by your health care provider. Do not stop taking the antibiotic even if you start to feel better. You may need to be on intravenous or oral antibiotics for several weeks.  Do not use IV drugs unless it is part of your medical treatment. Lifestyle  Do not get tattoos or body piercings.  Practice good oral hygiene. This includes: ? Brushing and flossing regularly. ? Scheduling routine dental appointments.  Do not use any products that contain nicotine or tobacco, such as cigarettes, e-cigarettes, and chewing tobacco. If you need help quitting, ask your health care provider.  If you drink alcohol: ? Limit how much you use to:  0-1 drink a day for women.  0-2 drinks a day for men. ? Be aware of how much alcohol is in your drink. In the [place] one drink equals one typical bottle of beer (12 oz), one-half glass of wine (5 oz), or one shot of hard liquor (1 oz). General instructions  Let your health care provider know before you have any dental or surgical procedures. You may need to take antibiotics before the procedure.  Tell all of your health care providers, including your dentist, that you have had endocarditis.  Gradually resume your usual activities.  Keep all follow-up visits as told by your health care provider. This is important. Contact  a health care provider if:  You have a fever.  Your symptoms do not improve.  Your symptoms get worse.  Your symptoms come back. Get help right away if:  You have trouble breathing.  You have chest pain.  You have any symptoms of a stroke. "BE FAST" is an easy way to remember the main warning signs of a stroke: ? B - Balance. Signs are dizziness, sudden trouble walking, or loss of balance. ? E - Eyes. Signs are trouble seeing or a sudden change in vision. ? F - Face. Signs are sudden weakness or numbness of the face, or the face or eyelid drooping on one side. ? A - Arms. Signs are weakness or numbness in an arm. This happens suddenly and usually on one side of the body. ? S - Speech.  Signs are sudden trouble speaking, slurred speech, or trouble understanding what people say. ? T - Time. Time to call emergency services. Write down what time symptoms started.  You have other signs of a stroke, such as: ? A sudden, severe headache with no known cause. ? Nausea or vomiting. ? Seizure. These symptoms may represent a serious problem that is an emergency. Do not wait to see if the symptoms will go away. Get medical help right away. Call your local emergency services (911 in the [place] Do not drive yourself to the hospital. Summary  Endocarditis is an infection of the heart valves or inner layer of the heart (endocardium). It is caused by bacteria or a fungus.  Having certain heart conditions or procedures may increase the risk of endocarditis.  Antibiotics are an important treatment for endocarditis. Take these medicines as told by your health care provider. Do not stop taking them even if you start to feel better.  Tell all of your health care providers, including your dentist, that you have had endocarditis. This information is not intended to replace advice given to you by your health care provider. Make sure you discuss any questions you have with your health care  provider. Document Revised: 07/07/2018 Document Reviewed: 07/07/2018 Elsevier Patient Education  2020 [employer].

## 2020-05-24 NOTE — Plan of Care (Signed)
  Problem: Nutrition: Goal: Adequate nutrition will be maintained Outcome: Adequate for Discharge   

## 2020-05-24 NOTE — Progress Notes (Signed)
Physical Therapy Treatment Patient Details Name: Candice Jordan MRN: [id number] DOB: 13-Sep-2001 Today's Date: 05/24/2020    History of Present Illness Patient is a 19 year old female presenting to [facility] ED on 5/25 with reported 1 week of shortness of breath. On arrival to ED CXR revealing of large right sided tension Pneumothorax. Chest Tube placed. 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 No longer requiring chest tube. History of MRSA Bacteremia/Endocarditis. COVID-19. Polysubstance Abuse.     PT Comments    Patient progressing well with mobility. Scored 23/24 on DGI indicating pt is not a fall risk and tolerates higher level balance challenges without LOB or difficulty. Able to perform back ward walking, changes in direction, head turns etc without difficulty. Pt continues to have flat affect but conversive today when asked questions. Eager to d/c home soon to grandma's house. Will continue to follow.    Follow Up Recommendations  No PT follow up     Equipment Recommendations  None recommended by PT    Recommendations for Other Services       Precautions / Restrictions Precautions Precautions: Other (comment) Precaution Comments: sitter present due to IVC; watch HR with activity Restrictions Weight Bearing Restrictions: No    Mobility  Bed Mobility Overal bed mobility: Independent                Transfers Overall transfer level: Modified independent Equipment used: None Transfers: Sit to/from Stand Sit to Stand: Modified independent (Device/Increase time)         General transfer comment: no assist needed, no evidence of imbalance.  Ambulation/Gait Ambulation/Gait assistance: Supervision Gait Distance (Feet): 700 Feet Assistive device: None Gait Pattern/deviations: Drifts right/left Gait velocity: decreased Gait velocity interpretation: 1.31 - 2.62 ft/sec, indicative of limited  community ambulator General Gait Details: mostly steady gait, tolerated higher level balance challenges. See balance section.   Stairs             Wheelchair Mobility    Modified Rankin (Stroke Patients Only)       Balance Overall balance assessment: Needs assistance Sitting-balance support: Feet supported;No upper extremity supported Sitting balance-Leahy Scale: Normal     Standing balance support: During functional activity Standing balance-Leahy Scale: Good               High level balance activites: Backward walking;Direction changes;Turns;Sudden stops;Head turns   Standardized Balance Assessment Standardized Balance Assessment : Dynamic Gait Index   Dynamic Gait Index Level Surface: Normal Change in Gait Speed: Normal Gait with Horizontal Head Turns: Normal Gait with Vertical Head Turns: Normal Gait and Pivot Turn: Normal Step Over Obstacle: Normal Step Around Obstacles: Normal Steps: Mild Impairment Total Score: 23      Cognition Arousal/Alertness: Awake/alert Behavior During Therapy: Flat affect Overall Cognitive Status: No family/caregiver present to determine baseline cognitive functioning                               Problem Solving: Slow processing General Comments: minimal verbalizations. More eye contact today. Conversing appropriately when asked questions.      Exercises      General Comments General comments (skin integrity, edema, etc.): Pt excited to be able to d/c tomorrow.      Pertinent Vitals/Pain Pain Assessment: No/denies pain    Home Living  Prior Function            PT Goals (current goals can now be found in the care plan section) Progress towards PT goals: Progressing toward goals    Frequency    Min 1X/week      PT Plan Current plan remains appropriate    Co-evaluation              AM-PAC PT "6 Clicks" Mobility   Outcome Measure  Help needed  turning from your back to your side while in a flat bed without using bedrails?: None Help needed moving from lying on your back to sitting on the side of a flat bed without using bedrails?: None Help needed moving to and from a bed to a chair (including a wheelchair)?: None Help needed standing up from a chair using your arms (e.g., wheelchair or bedside chair)?: None Help needed to walk in hospital room?: None Help needed climbing 3-5 steps with a railing? : A Little 6 Click Score: 23    End of Session   Activity Tolerance: Patient tolerated treatment well Patient left: in bed;with call bell/phone within reach;with nursing/sitter in room Nurse Communication: Mobility status PT Visit Diagnosis: Unsteadiness on feet (R26.81);Other abnormalities of gait and mobility (R26.89);Muscle weakness (generalized) (M62.81)     Time: 3790-2409 PT Time Calculation (min) (ACUTE ONLY): 15 min  Charges:  $Neuromuscular Re-education: 8-22 mins                     Vale Haven, PT, DPT Acute Rehabilitation Services Pager [phone] Office [phone]       Blake Divine A Lanier Ensign 05/24/2020, 1:00 PM

## 2020-05-24 NOTE — Progress Notes (Signed)
Pharmacy Antibiotic Note  Candice Jordan is a 19 y.o. female admitted on 04/27/2020 with MRSA bacteremia/TV endocarditis and septic emboli.   Today patient is afebrile, WBC WNL/stable, Scr <1/stable.  Vancomycin expected to be continued until 05/25/20 and provide one dose of IV Oritavancin on 05/25/20 prior to discharge  Plan: Continue Vancomycin to 1000 mg IV every 8 hours    Will follow renal function and clinical progress Obtain vancomycin trough if clinical status changes or if renal function becomes unstable   Height: 5\' 5"  (165.1 cm) Weight: 52.4 kg (115 lb 8.3 oz) IBW/kg (Calculated) : 57  Temp (24hrs), Avg:98.2 F (36.8 C), Min:97.9 F (36.6 C), Max:98.8 F (37.1 C)  Recent Labs  Lab 05/18/20 0615 05/21/20 0736 05/22/20 1149 05/24/20 0439  WBC 7.5 7.2  --  7.2  CREATININE 0.56 0.57 0.59 0.58  VANCOTROUGH  --  13*  --   --     Estimated Creatinine Clearance: 92.8 mL/min (by C-G formula based on SCr of 0.58 mg/dL).    No Known Allergies  Antimicrobials this admission: Vancomycin 5/26>> Zosyn 5/25>>5/26  Dose adjustments this admission: -5/28: VT 8 - change from 500 mg IV every 8 hours to 1000 mg IV every 8 hours -5/30: VT 16 - continue same regimen -6/4: VT 29 - collected during infusion, ordered another VT for 6/5 -6/5: VT 22 - Change from 1000 mg IV Q8H to 750 mg IV Q8H -6/8: VT 14 - continue 750mg  IV q8h for now -6/12 VT 16 - continue 750 mg IV q8h -6/19 VT 13 - Increase to 1000 mg IV Q8h   Microbiology results: 5/25 Bcx (at OSH): GPC in clusters (2/2) -MRSA  5/26BCx: staph aureus 2/2; BCID w/ MRSA 1/4 5/26UCx: neg 5/26MRSA PCR: pos  5/28 Bcx: staph aurus 2/2 6/1 Bcx: NGTD  Ajwa Kimberley A. 6/28, PharmD, BCPS, FNKF Clinical Pharmacist [place] Please utilize Amion for appropriate phone number to reach the unit pharmacist [facility])     05/24/2020 8:35 AM

## 2020-05-25 NOTE — TOC Progression Note (Signed)
Transition of Care [facility]) - Progression Note    Patient Details  Name: Candice Jordan MRN: [id number] Date of Birth: 08/14/2001  Transition of Care [facility] CM/SW Contact  Okey Dupre Lazaro Arms, LCSW Phone Number: 05/25/2020, 6:15 PM  Clinical Narrative:  Patient was IVC'd as it was determined by psychiatry to not have the capacity to make appropriate medical decisions in getting the appropriate medical care - IV antibiotics. [name] completed the IV antibiotic regimen today and The Notice of Commitment Change was faxed to the [facility] of Court rescinding the IVC.              Expected Discharge Plan and Services           Expected Discharge Date: 05/25/20                                     Social Determinants of Health (SDOH) Interventions  No SDOH interventions requested or needed at this time.  Readmission Risk Interventions No flowsheet data found.

## 2020-05-25 NOTE — Progress Notes (Signed)
Occupational Therapy Treatment Patient Details Name: Candice Jordan MRN: [id number] DOB: 11-26-2001 Today's Date: 05/25/2020    History of present illness Patient is a 19 year old female presenting to [facility] ED on 5/25 with reported 1 week of shortness of breath. On arrival to ED CXR revealing of large right sided tension Pneumothorax. Chest Tube placed. 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 No longer requiring chest tube. History of MRSA Bacteremia/Endocarditis. COVID-19. Polysubstance Abuse.    OT comments  Pt progressing well with OT goals, more talkative with therapist and demonstrating small improvements in mood. Pt overall Modified Independent for mobility to/from toilet, toileting task, and hallway distance mobility managing IV pole. Pt required minimal prompts or encouragement to complete various grooming tasks while standing at sink (wash hair, brush hair, brush teeth) and demonstrated ability to stand for at least 5 minutes without fatigue. Guided pt in follow-up of worksheets provided during previous OT session with pt verbalizing 3 additional healthy coping strategies to implement when negative feelings arise. Continued collaboration with pt on goal setting in individual aspects of life and importance of taking small steps to achieve those goals, such as pt desire to obtain a job with plan to go to an agency for assistance. Pt receptive and collaborative with all education. Will continue to follow acutely.    Follow Up Recommendations  No OT follow up;Supervision - Intermittent    Equipment Recommendations  None recommended by OT    Recommendations for Other Services      Precautions / Restrictions Precautions Precautions: Other (comment) Precaution Comments: sitter present due to IVC; watch HR with activity Restrictions Weight Bearing Restrictions: No       Mobility Bed Mobility Overal bed mobility:  Independent                Transfers Overall transfer level: Modified independent Equipment used: None Transfers: Sit to/from [employer] Sit to Stand: Modified independent (Device/Increase time) Stand pivot transfers: Modified independent (Device/Increase time)       General transfer comment: Modified Independence with mgmt of IV pole     Balance Overall balance assessment: No apparent balance deficits (not formally assessed)                                         ADL either performed or assessed with clinical judgement   ADL Overall ADL's : Modified independent     Grooming: Modified independent;Brushing hair;Oral care;Wash/dry hands;Standing Grooming Details (indicate cue type and reason): Pt Modified Independent after initial setup to wash hair with shampoo cap, brush hair, brush teeth, and wash hands standing at sink for at least 5 minutes.                 Toilet Transfer: Modified Independent;Ambulation;Regular Social worker and Hygiene: Modified independent         General ADL Comments: Modified Independent with mgmt of IV pole for ambulation to bathroom and toileting tasks     Vision       Perception     Praxis      Cognition Arousal/Alertness: Awake/alert Behavior During Therapy: Flat affect Overall Cognitive Status: No family/caregiver present to determine baseline cognitive functioning Area of Impairment: Awareness                   Current Attention Level:  Alternating     Safety/Judgement: Decreased awareness of safety;Decreased awareness of deficits Awareness: Emergent Problem Solving: Decreased initiation General Comments: improved conversing with therapist and eye contact. Pt with flat affect but did smile a few times during session        Exercises     Shoulder Instructions       General Comments Pt with improving affect, conversing with therapist some,  answering questions appropriately, and smiled a few times during session. pt excited to be discharging from hospital. Completed follow-up with pt on worksheets on goals and coping strategies from previous OT session with pt reporting 3 new coping strategies to implement when negative feelings occur. Reinforced acheivement of personal goals and taking small steps to acheive them (such as pt desire for job and her plan to go to employment agency)    Pertinent Vitals/ Pain       Pain Assessment: No/denies pain  Home Living                                          Prior Functioning/Environment              Frequency  Min 2X/week        Progress Toward Goals  OT Goals(current goals can now be found in the care plan section)  Progress towards OT goals: Progressing toward goals  Acute Rehab OT Goals Patient Stated Goal: to go home OT Goal Formulation: With patient Time For Goal Achievement: 06/06/20 Potential to Achieve Goals: Good ADL Goals Pt Will Perform Grooming: with modified independence;standing Pt Will Perform Upper Body Bathing: with modified independence Pt Will Perform Lower Body Bathing: with modified independence;sit to/from stand Pt Will Perform Upper Body Dressing: with modified independence;sitting;standing Pt Will Perform Lower Body Dressing: with modified independence;sit to/from stand Pt Will Transfer to Toilet: with modified independence;ambulating;regular height toilet Pt Will Perform Toileting - Clothing Manipulation and hygiene: with modified independence;sit to/from stand Pt/caregiver will Perform Home Exercise Program: Increased strength;Both right and left upper extremity;With theraband;With written HEP provided;Independently Additional ADL Goal #1: Pt will be able to divide and alternate attention while performing novel task or complex path finding with min cues Additional ADL Goal #2: Pt will demonstrates anticipatory awareness of  deficits Additional ADL Goal #3: Pt will establish 3 goals to address IADL tasks Additional ADL Goal #4: Pt will recall 3-5 healthy life skills to apply to IADLs upon d/c  Plan Discharge plan remains appropriate    Co-evaluation                 AM-PAC OT "6 Clicks" Daily Activity     Outcome Measure   Help from another person eating meals?: None Help from another person taking care of personal grooming?: None Help from another person toileting, which includes using toliet, bedpan, or urinal?: None Help from another person bathing (including washing, rinsing, drying)?: A Little Help from another person to put on and taking off regular upper body clothing?: None Help from another person to put on and taking off regular lower body clothing?: None 6 Click Score: 23    End of Session    OT Visit Diagnosis: Unsteadiness on feet (R26.81);Other symptoms and signs involving cognitive function   Activity Tolerance Patient tolerated treatment well   Patient Left in chair;with call bell/phone within reach;with nursing/sitter in room   Nurse Communication Mobility status  Time: 4403-4742 OT Time Calculation (min): 28 min  Charges: OT General Charges $OT Visit: 1 Visit OT Treatments $Self Care/Home Management : 8-22 mins $Therapeutic Activity: 8-22 mins  Lorre Munroe, OTR/L   Lorre Munroe 05/25/2020, 12:46 PM

## 2020-05-25 NOTE — Discharge Summary (Signed)
Discharge Summary  Candice Jordan LVF:[id number] DOB: 12-13-2000  PCP: System, Provider Not In  Admit date: 04/27/2020 Discharge date: 05/25/2020  Time spent: 35 minutes  Recommendations for Outpatient Follow-up:  1. Follow-up with Suboxone clinic 2. Follow-up with your PCP 3. Follow-up with infectious disease 4. Take your medications as prescribed  Discharge Diagnoses:  Active Hospital Problems   Diagnosis Date Noted  . 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 Injection of illicit drug within last 12 months 05/18/2020  . Status post chest tube placement (HCC)   . Polysubstance abuse (HCC)   . Anemia   . Hypokalemia   . Hypomagnesemia   . Tachycardia   . Positive hepatitis C antibody test   . Spontaneous tension pneumothorax   . AKI (acute kidney injury) (HCC)   . Sepsis without acute organ dysfunction (HCC)   . Pneumothorax   . MRSA bacteremia 03/20/2020    Resolved Hospital Problems  No resolved problems to display.    Discharge Condition: Stable  Diet recommendation: Resume previous diet  Vitals:   05/25/20 0004 05/25/20 0629  BP: 99/69 90/61  Pulse: 100 99  Resp: 16 18  Temp: 99 F (37.2 C) 98.7 F (37.1 C)  SpO2: 97% 97%    History of present illness:   As per Dr. Burnell Blanks "19 year old female presents to [facility] ED on 5/25 with reported 1 week of shortness of breath. Patient was found at [address] with oxygen saturation 79%. Reports using Heroin last night. Diagnosed with MRSA Bacteremia/Endocarditis and COVID 03/08/2020 however has left AMA numerous times throughout the month of April. On arrival to ED. CXR revealing of large right sided tension Pneumothorax. Chest Tube placed. 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 She has been IVC'd. IVC was  renewed on 05/05/2020.  The patient has had continual tachycardia since admission. Likely due to CMO secondary to chronic cocaine and heroin abuse. CTA chest has ruled out PE, but does demonstrate moderate bilateral pleural effusions.I have discussed the pleural effusions with IR. It seems that they are small and the patient is unlikely to benefit from thoracentesis. Last blood culture which were positive with MRSA and patient will from 04/29/2020.Blood culture from 05/03/2020 have been negative. Patient remains on IV vancomycin per ID and they plan to continue this until 05/25/2020 and provide her 1 dose of IV oritavancin that day which will suffice for 2 weeks of IV antibiotic for MRSA and she can be discharged on that day".  05/25/20: Seen and examined at her bedside with one-to-one in place in the room.  No acute events overnight.  She has completed 4 weeks of IV vancomycin.  Receiving her dose of Orbactiv, this lasts for 2 weeks.  Will be covered for total of 6 weeks of IV antibiotics for MRSA endocarditis.     Hospital Course:  Principal Problem:   Endocarditis of tricuspid valve Active Problems:   MRSA bacteremia   Spontaneous tension pneumothorax   AKI (acute kidney injury) (HCC)   Sepsis without acute organ dysfunction (HCC)   Pneumothorax   Status post chest tube placement (HCC)   Polysubstance abuse (HCC)   Anemia   Hypokalemia   Hypomagnesemia   Tachycardia   Positive hepatitis C antibody test   Injection of illicit drug within last 12  months   Resolved septic shock, secondary to MRSA bacteremia complicated by tricuspid valve endocarditis with septic emboli to the lungs: Patient with multiple admissions and noted to be leaving AMA and as such has remained fairly untreated. Patient met criteria for sepsis on admission with tachypnea, tachycardia, leukocytosis, concern for untreated bacteremia. Repeat 2D echo which was done showed worsening/severe tricuspid valvular  regurgitation, tricuspid valve vegetation attached to posterior leaflet measuring 0.9 cm x 0.5 cm more apparent on this study the last study. Severely dilated right atrial size. Hyperdynamic right ventricular systolic function.CT surgery was consulted 04/28/2020 for possible valve repair however due to ongoing IV drug abuse patient at high risk for reinfection if she were to undergo valve replacement and recommending focusing on drug rehabilitation. Per CT surgery obtaining a TEE will not change surgical candidacy and recommending medical management for now. Repeat blood cultures from 04/29/2020 + for staph aureus. Repeat blood cultures drawn on 6/1/202 were negative. ID recommended IV vancomycin with estimated end date of therapy 05/25/2020 then followed by 1 dose of IV oritavancin, which lasts 2 weeks. Follow-up with ID posthospitalization  Possible trichomoniasis/candida vaginosis Reports vaginal discharge and itching 05/23/20: was treated with 1 dose of fluconazole for yeast infection Recommend following up with primary care provider or gynecology.  Patient understands and agrees to plan.  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 Chest tube removed 04/28/2020 per PCCM. Repeat chest x-ray done showed no recurrent pneumothorax. Recent CT angiogram of the chest done on 05/07/2020 shows significant improvement in right pneumothorax.  Hypoxia has resolved, O2 saturation 97% on room air.  Denies dyspnea or pleuritic pain.  Resolving bilateral moderate pleural effusions. CTA chest has demonstrated bilateral moderate pleural effusions. Previous hospitalist have discussed the patient with Dr. Larena Glassman. He states that in his estimation the pleural effusions are small and  do not warrant intervention at this time.   Resolved transient Tachycardia: Likely due to chronic IV abuse of heroin and cocaine and endocarditis. CTA chest negative for PE. Increased dose of carvedilol to 6.25 p.o. twice daily on 05/15/2020. Tachycardia resolved. Continue current management.  Polysubstance abuse/heroin use: UDS noted at outside hospital to be positive for amphetamines.Patient noted to have multiple admissions for endocarditis, where she has left AMA . Patient seen by psychiatry on 04/27/2020 and is felt patient lacks capacity to make an informed medical decision regarding her medical care and disposition and recommending social work evaluation to contact next of kin no appropriate hospital administrator to help with decision-making process. Continue Suboxone. Was on clonidine taper/detox protocol. Patient wanted to leave AMA again (04/29/2020). Patient however did state she understands the risk of leaving AMA which include death. Was also felt by PCCM that patient lacked capacity poor insight into her current medical situation.Patient with poor insight into her current medical situation. Patient not thinking rationally. Patient has subsequently been IVC'd to finish out treatment for MRSA disseminated infection/endocarditis with worsening tricuspid regurgitation.Patient has been reassessed by psych who are in agreement that patient lacks capacity. Counseled to quit illicit drug use. TOC assisted to provide resources for Suboxone clinic and follow-up with infectious disease.  Anemiaof chronic disease: Anemia panel consistent with anemia of chronic disease. Status post transfusion 1 unit packed red blood cells. H&H stable.  Hemoglobin 10.3 from 9.4.  Resolved hypomagnesemia/hypokalemia, post repletion: Serum potassium 4.6,  serum magnesium 1.8.  Positive hep C antibody/transaminitis: HCV RNA quant of 2260. Outpatient follow-up with ID.   Resolved back pain: Resolved  according to patient.  Resolved allergic rash/contact dermatitis in lower back:   DVT prophylaxis:Xarelto for DVT prophylaxis; Had resumed SQ lovenox and SQ heparin.  Xarelto was used as a substitute.   Code Status: Full Code   Consultants:  ID, cardiothoracic surgery    Consultations:  Cardiothoracic surgery  Infectious disease  Discharge Exam: BP 90/61 (BP Location: Left Arm)   Pulse 99   Temp 98.7 F (37.1 C) (Oral)   Resp 18   Ht '5\' 5"'$  (1.651 m)   Wt 52.4 kg   SpO2 97%   BMI 19.22 kg/m  . General: 19 y.o. year-old female well developed well nourished in no acute distress.  Alert and oriented x3. . Cardiovascular: Regular rate and rhythm with no rubs or gallops.  No thyromegaly or JVD noted.   Marland Kitchen Respiratory: Clear to auscultation with no wheezes or rales. Good inspiratory effort. . Abdomen: Soft nontender nondistended with normal bowel sounds x4 quadrants. . Musculoskeletal: No lower extremity edema. 2/4 pulses in all 4 extremities. Marland Kitchen Psychiatry: Mood is appropriate for condition and setting  Discharge Instructions You were cared for by a hospitalist during your hospital stay. If you have any questions about your discharge medications or the care you received while you were in the hospital after you are discharged, you can call the unit and asked to speak with the hospitalist on call if the hospitalist that took care of you is not available. Once you are discharged, your primary care physician will handle any further medical issues. Please note that NO REFILLS for any discharge medications will be authorized once you are discharged, as it is imperative that you return to your primary care physician (or establish a relationship with a primary care physician if you do not have one) for your aftercare needs so that they can reassess your need for medications and monitor your lab values.   Allergies as of 05/25/2020   No Known Allergies     Medication List      TAKE these medications   buprenorphine-naloxone 8-2 mg Subl SL tablet Commonly known as: SUBOXONE Place 1 tablet under the tongue 2 (two) times daily for 3 days.   carvedilol 6.25 MG tablet Commonly known as: COREG Take 1 tablet (6.25 mg total) by mouth 2 (two) times daily with a meal.   polyethylene glycol 17 g packet Commonly known as: MIRALAX / GLYCOLAX Take 17 g by mouth daily as needed for moderate constipation.   QUEtiapine 50 MG tablet Commonly known as: SEROQUEL Take 1 tablet (50 mg total) by mouth 2 (two) times daily.      No Known Allergies  Follow-up Information    Michel Bickers, MD. Call in 1 day(s).   Specialty: Infectious Diseases Why: Please call for a post hospital follow up appointment Contact information: [address] [place] [address] [phone]        [facility]. Call.   Why: please call for a post hospital follow up appointment. Contact information: [address] [place]  [address] [phone]               The results of significant diagnostics from this hospitalization (including imaging, microbiology, ancillary and laboratory) are listed below for reference.    Significant Diagnostic Studies: CT ANGIO CHEST PE W OR WO CONTRAST  Result Date: 05/07/2020 CLINICAL DATA:  PE suspected. High probability. One week of shortness of breath. O2 saturation 79% heroin use last night. EXAM: CT ANGIOGRAPHY CHEST WITH CONTRAST TECHNIQUE: Multidetector CT imaging of the chest was performed using the standard protocol during bolus administration of intravenous contrast. Multiplanar CT image reconstructions and MIPs were obtained to evaluate the vascular anatomy. CONTRAST:  41m OMNIPAQUE IOHEXOL 350 MG/ML SOLN COMPARISON:  Chest x-ray 04/28/2020, CT on 04/26/2020 FINDINGS: Cardiovascular: Heart size is normal. No pericardial effusion. Pulmonary arteries are well opacified by  contrast bolus. No acute pulmonary embolus. Mediastinum/Nodes: Stable, residual thymic tissue no significant adenopathy. Esophagus is normal in appearance. Bilateral prominence of axillary lymph nodes, symmetric in size and measuring up to follow-up 1.2 centimeters, possibly reactive. The visualized portion of the thyroid gland has a normal appearance. Lungs/Pleura: Significant interval improvement in RIGHT pneumothorax, now only a small air collection in the posterior RIGHT LOWER lobe, measuring 2.5 x 1.5 centimeters. There are moderate bilateral pleural effusions, increased over prior study. Cystic spaces are identified in the LEFT UPPER lobe, consistent with pneumatoceles. Numerous irregular cavitary lesions are again identified throughout the lungs, consistent with septic emboli. A nodule in the LEFT UPPER lobe measures 7 millimeters on image 32 of series 9, possibly representing early or healing cavitary lesion. No frank consolidation. No evidence for pulmonary edema. There is peribronchial thickening. Upper Abdomen: Unremarkable. Musculoskeletal: Remote fracture of the LEFT 7th rib. No acute fracture. Review of the MIP images confirms the above findings. IMPRESSION: 1. Technically adequate exam showing no acute pulmonary embolus. 2. Significant interval improvement in RIGHT pneumothorax, now only a small air collection in the posterior RIGHT LOWER lobe. 3. Numerous irregular cavitary lesions throughout the lungs, consistent with septic emboli. 4. Moderate bilateral pleural effusions, increased over prior study. 5. Bilateral axillary lymph nodes, possibly reactive. 6. Remote fracture of the LEFT 7th rib. Electronically Signed   By: ENolon NationsM.D.   On: 05/07/2020 10:30   DG CHEST PORT 1 VIEW  Result Date: 05/13/2020 CLINICAL DATA:  Recent cavitary pneumonia.  Recent pneumothorax. EXAM: PORTABLE CHEST 1 VIEW COMPARISON:  CT angiogram chest May 07, 2020; chest radiograph Apr 28, 2020 FINDINGS: Question  small loculated pneumothorax versus bulla right base, stable. Scattered apparent bullae elsewhere, also present on recent CT. Areas of mild scarring noted. Atelectatic change is present in the left base. No well-defined consolidation evident. The cavitary nodular lesions seen in the upper lobes on recent CT are not seen by radiography. Heart is upper normal in size with pulmonary vascularity normal. No adenopathy. No bone lesions IMPRESSION: There is scattered bullae and scarring. Question focal bulla versus small loculated pneumothorax right base, stable. No larger pneumothorax evident. Atelectatic change noted left base. Small cavitary nodular opacity seen in the upper lobes on recent CT not appreciable radiography. No consolidation. Heart is normal in size.  No adenopathy appreciable. Electronically Signed   By: WLowella GripIII M.D.   On: 05/13/2020 13:21   DG CHEST PORT 1 VIEW  Result Date: 04/28/2020 CLINICAL DATA:  Cough and shortness of breath.  Chest tube removal. EXAM: PORTABLE CHEST 1 VIEW COMPARISON:  04/27/2020 FINDINGS: Right pleural drain is been removed in the interval. No evidence for pneumothorax. The diffuse airspace opacity in the mid and lower right lung is similar to prior. Lateral pleural thickening/fluid evident. Vascular congestion noted bilaterally with left base patchy consolidative opacity. IMPRESSION: Interval removal of right chest tube without substantial residual pneumothorax. Diffuse right and  patchy left airspace disease consistent with known pneumonia. Electronically Signed   By: Misty Stanley M.D.   On: 04/28/2020 08:31   DG CHEST PORT 1 VIEW  Result Date: 04/27/2020 CLINICAL DATA:  History of pneumothorax EXAM: PORTABLE CHEST 1 VIEW COMPARISON:  Chest radiograph 04/27/2020 FINDINGS: Monitoring leads overlie the patient. Stable enlarged cardiac and mediastinal contours. Right chest tube remains in position. Similar-appearing right-greater-than-left patchy areas of  consolidation. Probable small right pleural effusion. No definite residual right-sided pneumothorax. Osseous structures unremarkable. Old left rib fractures. IMPRESSION: Similar-appearing right-greater-than-left airspace opacities. Right chest tube remains in place.  No definite pneumothorax. Electronically Signed   By: Lovey Newcomer M.D.   On: 04/27/2020 16:32   DG CHEST PORT 1 VIEW  Result Date: 04/27/2020 CLINICAL DATA:  Pneumothorax. EXAM: PORTABLE CHEST 1 VIEW COMPARISON:  Radiograph and CT yesterday. FINDINGS: Right pigtail catheter remains in place. No visualized pneumothorax. Small amount of subcutaneous emphysema in the right chest wall. There is small volume right pleural fluid versus pleural thickening. Worsening patchy opacities throughout the right perihilar lung. Faint patchy opacities in the left lung better characterized on CT. Small left pleural effusion. Unchanged heart size and mediastinal contours. No acute osseous abnormalities are seen. IMPRESSION: 1. No visualized pneumothorax. Right pigtail catheter remains in place. Small volume subcutaneous emphysema in the right chest wall. 2. Worsening patchy opacities in the right perihilar lung, may represent pneumonia or re-expansion pulmonary edema. 3. Small bilateral pleural effusions. Electronically Signed   By: Keith Rake M.D.   On: 04/27/2020 01:22   VAS [name] LOWER EXTREMITY VENOUS (DVT)  Result Date: 04/28/2020  Lower Venous DVTStudy Indications: Edema.  Risk Factors: None identified. Comparison Study: 03/20/2020 - Negative for DVT. Performing Technologist: Oliver Hum RVT  Examination Guidelines: A complete evaluation includes B-mode imaging, spectral Doppler, color Doppler, and power Doppler as needed of all accessible portions of each vessel. Bilateral testing is considered an integral part of a complete examination. Limited examinations for reoccurring indications may be performed as noted. The reflux portion of the exam is  performed with the patient in reverse Trendelenburg.  +---------+---------------+---------+-----------+----------+--------------+ RIGHT    CompressibilityPhasicitySpontaneityPropertiesThrombus Aging +---------+---------------+---------+-----------+----------+--------------+ CFV      Full           Yes      Yes                                 +---------+---------------+---------+-----------+----------+--------------+ SFJ      Full                                                        +---------+---------------+---------+-----------+----------+--------------+ FV Prox  Full                                                        +---------+---------------+---------+-----------+----------+--------------+ FV Mid   Full                                                        +---------+---------------+---------+-----------+----------+--------------+  FV DistalFull                                                        +---------+---------------+---------+-----------+----------+--------------+ PFV      Full                                                        +---------+---------------+---------+-----------+----------+--------------+ POP      Full           Yes      Yes                                 +---------+---------------+---------+-----------+----------+--------------+ PTV      Full                                                        +---------+---------------+---------+-----------+----------+--------------+ PERO     Full                                                        +---------+---------------+---------+-----------+----------+--------------+   +---------+---------------+---------+-----------+----------+--------------+ LEFT     CompressibilityPhasicitySpontaneityPropertiesThrombus Aging +---------+---------------+---------+-----------+----------+--------------+ CFV      Full           Yes      Yes                                  +---------+---------------+---------+-----------+----------+--------------+ SFJ      Full                                                        +---------+---------------+---------+-----------+----------+--------------+ FV Prox  Full                                                        +---------+---------------+---------+-----------+----------+--------------+ FV Mid   Full                                                        +---------+---------------+---------+-----------+----------+--------------+ FV DistalFull                                                        +---------+---------------+---------+-----------+----------+--------------+  PFV      Full                                                        +---------+---------------+---------+-----------+----------+--------------+ POP      Full           Yes      Yes                                 +---------+---------------+---------+-----------+----------+--------------+ PTV      Full                                                        +---------+---------------+---------+-----------+----------+--------------+ PERO     Full                                                        +---------+---------------+---------+-----------+----------+--------------+     Summary: RIGHT: - There is no evidence of deep vein thrombosis in the lower extremity.  - No cystic structure found in the popliteal fossa.  LEFT: - There is no evidence of deep vein thrombosis in the lower extremity.  - No cystic structure found in the popliteal fossa.  *See table(s) above for measurements and observations. Electronically signed by Servando Snare MD on 04/28/2020 at 3:39:39 PM.    Final    ECHOCARDIOGRAM LIMITED  Result Date: 04/27/2020    ECHOCARDIOGRAM LIMITED REPORT   Patient Name:   ISABELL BONAFEDE Date of Exam: 04/27/2020 Medical Rec #:  [id number]     Height:       65.0 in Accession #:    [id number]    Weight:        108.5 lb Date of Birth:  [birth year]    BSA:          1.525 m Patient Age:    18 years      BP:           90/65 mmHg Patient Gender: F             HR:           110 bpm. Exam Location:  Inpatient Procedure: Limited Echo, Cardiac Doppler and Color Doppler Indications:    Endocarditis  History:        Patient has prior history of Echocardiogram examinations, most                 recent 03/21/2020. Endocarditis and TV vegetation 03/21/20;                 Signs/Symptoms:Shortness of Breath, Bacteremia and MRSA. IVDU,                 COVID.  Sonographer:    Dustin Flock Referring Phys: [place]  1. There is a tricuspid valve vegetation which appears to be attached to the posterior leaflet that measures up to 0.9 cm x 0.5 cm. It is more apparent  on this study than the last. It is best seen on subcostal views. 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 Unable to exclude leaflet perforation. Given progression of TR, TEE is recommended for better characterization. The tricuspid valve is abnormal. Tricuspid valve regurgitation is severe.  2. Left ventricular ejection fraction, by estimation, is 60 to 65%. The left ventricle has normal function.  3. Right ventricular systolic function is hyperdynamic. The right ventricular size is mildly enlarged. There is mildly elevated pulmonary artery systolic pressure. The estimated right ventricular systolic pressure is 38.8 mmHg.  4. Right atrial size was severely dilated.  5. The mitral valve is grossly normal. Trivial mitral valve regurgitation. No evidence of mitral stenosis.  6. The aortic valve is tricuspid. Aortic valve regurgitation is not visualized. No aortic stenosis is present.  7. The inferior vena cava is normal in size with greater than 50% respiratory variability, suggesting right atrial pressure of 3 mmHg. 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 TEE is recommended. FINDINGS  Left  Ventricle: Left ventricular ejection fraction, by estimation, is 60 to 65%. The left ventricle has normal function. The left ventricular internal cavity size was normal in size. There is no left ventricular hypertrophy. Right Ventricle: The right ventricular size is mildly enlarged. No increase in right ventricular wall thickness. Right ventricular systolic function is hyperdynamic. There is mildly elevated pulmonary artery systolic pressure. The tricuspid regurgitant velocity is 2.99 m/s, and with an assumed right atrial pressure of 3 mmHg, the estimated right ventricular systolic pressure is 38.8 mmHg. Right Atrium: Right atrial size was severely dilated. Pericardium: A small pericardial effusion is present. Mitral Valve: The mitral valve is grossly normal. Trivial mitral valve regurgitation. No evidence of mitral valve stenosis. There is no evidence of mitral valve vegetation. Tricuspid Valve: There is a tricuspid valve vegetation which appears to be attached to the posterior leaflet that measures up to 0.9 cm x 0.5 cm. It is more apparent on this study than the last. It is best seen on subcostal views. 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 Unable to exclude leaflet perforation. Given progression of TR, TEE is recommended for better characterization. The tricuspid valve is abnormal. Tricuspid valve regurgitation is severe. The flow in the hepatic veins is reversed during ventricular systole. Aortic Valve: The aortic valve is tricuspid. Aortic valve regurgitation is not visualized. No aortic stenosis is present. Venous: The inferior vena cava is normal in size with greater than 50% respiratory variability, suggesting right atrial pressure of 3 mmHg. IAS/Shunts: There is left bowing of the interatrial septum, suggestive of elevated right atrial pressure.  LEFT VENTRICLE PLAX 2D LVIDd:         3.80 cm LVIDs:         2.30 cm LV PW:         1.10 cm LV IVS:        1.10 cm  LEFT ATRIUM         Index LA diam:     3.10 cm 2.03 cm/m   AORTA Ao Root diam: 2.50 cm TRICUSPID VALVE TR Peak grad:   35.8 mmHg TR Vmax:        299.00 cm/s Lennie Odor MD Electronically signed by Lennie Odor MD Signature Date/Time: 04/27/2020/12:16:46 PM    Final     Microbiology: No results found for this or any previous visit (from the past 240 hour(s)).   Labs: Basic Metabolic Panel: Recent Labs  Lab 05/20/20 0840 05/21/20 0736 05/22/20 1149  05/24/20 0439  NA  --  135 135 136  K  --  4.4 3.9 4.6  CL  --  103 101 102  CO2  --  _0 GLUCOSE  --  100* 146* 102*  BUN  --  _1 CREATININE  --  0.57 0.59 0.58  CALCIUM  --  8.8* 8.9 9.1  MG 1.6* 1.6* 1.6* 1.8  PHOS  --   --  5.1*  --    Liver Function Tests: Recent Labs  Lab 05/22/20 1149  ALBUMIN 2.5*   No results for input(s): LIPASE, AMYLASE in the last 168 hours. No results for input(s): AMMONIA in the last 168 hours. CBC: Recent Labs  Lab 05/21/20 0736 05/24/20 0439  WBC 7.2 7.2  NEUTROABS 2.8 2.6  HGB 9.4* 10.3*  HCT 31.1* 33.4*  MCV 90.9 90.8  PLT 176 149*   Cardiac Enzymes: No results for input(s): CKTOTAL, CKMB, CKMBINDEX, TROPONINI in the last 168 hours. BNP: BNP (last 3 results) Recent Labs    03/25/20 0702  BNP 104.3*    ProBNP (last 3 results) No results for input(s): PROBNP in the last 8760 hours.  CBG: No results for input(s): GLUCAP in the last 168 hours.     Signed:  Kayleen Memos, MD [facility] 05/25/2020, 11:45 AM

## 2020-05-25 NOTE — TOC Progression Note (Signed)
Transition of Care [facility]) - Progression Note    Patient Details  Name: Candice Jordan MRN: [id number] Date of Birth: 11-13-2001  [facility] CM/SW Contact  Leone Haven, RN Phone Number: 05/25/2020, 5:45 PM  Clinical Narrative:    NCM gave suboxone clinic resources to [name] Charge RN to give to patient, for patient to call to scheduled her an appointment. As soon as she makes her appointment she needs to tell MD the time and date of the appt before she is discharged.        Expected Discharge Plan and Services           Expected Discharge Date: 05/25/20                                     Social Determinants of Health (SDOH) Interventions    Readmission Risk Interventions No flowsheet data found.

## 2020-05-26 NOTE — Progress Notes (Signed)
DISCHARGE NOTE HOME Candice Jordan to be discharged Home per MD order. Discussed prescriptions and follow up appointments with the patient. Prescriptions given to patient; medication list explained in detail. Patient verbalized understanding.  Skin clean, dry and intact without evidence of skin break down, no evidence of skin tears noted. IV catheter discontinued intact. Site without signs and symptoms of complications. Dressing and pressure applied. Pt denies pain at the site currently. No complaints noted.  Patient free of lines, drains, and wounds.   An After Visit Summary (AVS) was printed and given to the patient. Patient escorted via wheelchair, and discharged home via private auto.  Selina Cooley BSN, RN3

## 2020-05-26 NOTE — Discharge Summary (Signed)
Discharge Summary  Bruce Mayers YQM:[id number] DOB: [birth year]  PCP: System, Provider Not In  Admit date: 04/27/2020 Discharge date: 05/26/2020  Time spent: 35 minutes  Recommendations for Outpatient Follow-up:  1. Follow-up with Suboxone clinic 2. Follow-up with your PCP 3. Follow-up with infectious disease 4. Take your medications as prescribed  Discharge Diagnoses:  Active Hospital Problems   Diagnosis Date Noted  . 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 Injection of illicit drug within last 12 months 05/18/2020  . Status post chest tube placement ([place])   . Polysubstance abuse ([place])   . Anemia   . Hypokalemia   . Hypomagnesemia   . Tachycardia   . Positive hepatitis C antibody test   . Spontaneous tension pneumothorax   . AKI (acute kidney injury) ([place])   . Sepsis without acute organ dysfunction ([place])   . Pneumothorax   . MRSA bacteremia 03/20/2020    Resolved Hospital Problems  No resolved problems to display.    Discharge Condition: Stable  Diet recommendation: Resume previous diet  Vitals:   05/25/20 1930 05/26/20 0545  BP: (!) 97/57 98/60  Pulse: 99 99  Resp: 18 18  Temp: 99.1 F (37.3 C) 99 F (37.2 C)  SpO2: 96% 100%    History of present illness:   As per Dr. Joni Fears "19 year old female presents to [facility] ED on 5/25 with reported 1 week of shortness of breath. Patient was [address] with oxygen saturation 79%. Reports using Heroin last night. Diagnosed with MRSA Bacteremia/Endocarditis and COVID 03/08/2020 however has left AMA numerous times throughout the month of April. On arrival to ED. CXR revealing of large right sided tension Pneumothorax. Chest Tube placed. 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 She has been IVC'd. IVC was  renewed on 05/05/2020.  The patient has had continual tachycardia since admission. Likely due to [place] secondary to chronic cocaine and heroin abuse. CTA chest has ruled out PE, but does demonstrate moderate bilateral pleural effusions.I have discussed the pleural effusions with IR. It seems that they are small and the patient is unlikely to benefit from thoracentesis. Last blood culture which were positive with MRSA and patient will from 04/29/2020.Blood culture from 05/03/2020 have been negative. Patient remains on IV vancomycin per ID and they plan to continue this until 05/25/2020 and provide her 1 dose of IV oritavancin that day which will suffice for 2 weeks of IV antibiotic for MRSA and she can be discharged on that day".  05/26/20: Seen and examined at her bedside.  She has no new complaints. Eager to go home.   Hospital Course:  Principal Problem:   Endocarditis of tricuspid valve Active Problems:   MRSA bacteremia   Spontaneous tension pneumothorax   AKI (acute kidney injury) ([place])   Sepsis without acute organ dysfunction ([facility])   Pneumothorax   Status post chest tube placement ([facility])   Polysubstance abuse ([facility])   Anemia   Hypokalemia   Hypomagnesemia   Tachycardia   Positive hepatitis C antibody test   Injection of illicit drug within last 12 months   Resolved septic shock, secondary to MRSA bacteremia complicated by tricuspid valve endocarditis with septic emboli to the lungs: Patient with multiple admissions and noted to be leaving AMA and as such has remained fairly untreated.  Patient met criteria for sepsis on admission with tachypnea, tachycardia, leukocytosis, concern for untreated bacteremia. Repeat 2D echo which was done showed worsening/severe tricuspid valvular regurgitation, tricuspid valve vegetation attached to posterior leaflet measuring 0.9 cm x 0.5 cm more apparent on this study the last study. Severely dilated right atrial size. Hyperdynamic right ventricular  systolic function.CT surgery was consulted 04/28/2020 for possible valve repair however due to ongoing IV drug abuse patient at high risk for reinfection if she were to undergo valve replacement and recommending focusing on drug rehabilitation. Per CT surgery obtaining a TEE will not change surgical candidacy and recommending medical management for now. Repeat blood cultures from 04/29/2020 + for staph aureus. Repeat blood cultures drawn on 6/1/202 were negative. ID recommended IV vancomycin with estimated end date of therapy 05/25/2020 then followed by 1 dose of IV oritavancin, which lasts 2 weeks. Follow-up with ID posthospitalization  Possible trichomoniasis/candida vaginosis Reports vaginal discharge and itching 05/23/20: was treated with 1 dose of fluconazole for yeast infection Recommend following up with primary care provider or gynecology.  Patient understands and agrees to plan.  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 Chest tube removed 04/28/2020 per PCCM. Repeat chest x-ray done showed no recurrent pneumothorax. Recent CT angiogram of the chest done on 05/07/2020 shows significant improvement in right pneumothorax.  Hypoxia has resolved, O2 saturation 97% on room air.  Denies dyspnea or pleuritic pain.  Resolving bilateral moderate pleural effusions. CTA chest has demonstrated bilateral moderate pleural effusions. Previous hospitalist have discussed the patient with Dr. Larena Glassman. He states that in his estimation the pleural effusions are small and do not warrant intervention at this time.   Resolved transient Tachycardia: Likely due to chronic IV abuse of heroin and cocaine and endocarditis. CTA chest negative for PE. Increased dose of carvedilol to  6.25 p.o. twice daily on 05/15/2020. Tachycardia resolved. Continue current management.  Polysubstance abuse/heroin use: UDS noted at outside hospital to be positive for amphetamines.Patient noted to have multiple admissions for endocarditis, where she has left AMA . Patient seen by psychiatry on 04/27/2020 and is felt patient lacks capacity to make an informed medical decision regarding her medical care and disposition and recommending social work evaluation to contact next of kin no appropriate hospital administrator to help with decision-making process. Continue Suboxone. Was on clonidine taper/detox protocol. Patient wanted to leave AMA again (04/29/2020). Patient however did state she understands the risk of leaving AMA which include death. Was also felt by PCCM that patient lacked capacity poor insight into her current medical situation.Patient with poor insight into her current medical situation. Patient not thinking rationally. Patient has subsequently been IVC'd to finish out treatment for MRSA disseminated infection/endocarditis with worsening tricuspid regurgitation.Patient has been reassessed by psych who are in agreement that patient lacks capacity. Counseled to quit illicit drug use. TOC assisted to provide resources for Suboxone clinic and follow-up with infectious disease.  Anemiaof chronic disease: Anemia panel consistent with anemia of chronic disease. Status post transfusion 1 unit packed red blood cells. H&H stable.  Hemoglobin 10.3 from 9.4.  Resolved hypomagnesemia/hypokalemia, post repletion: Serum potassium 4.6, serum magnesium 1.8.  Positive hep C antibody/transaminitis: HCV RNA quant of 2260. Outpatient follow-up with ID.   Resolved back pain: Resolved according to patient.  Resolved allergic rash/contact dermatitis in lower back:   DVT prophylaxis:Xarelto  for DVT prophylaxis; Had resumed SQ lovenox and SQ heparin.  Xarelto was used as a substitute.   Code  Status: Full Code   Consultants:  ID, cardiothoracic surgery    Consultations:  Cardiothoracic surgery  Infectious disease  Discharge Exam: BP 98/60 (BP Location: Right Arm)   Pulse 99   Temp 99 F (37.2 C) (Oral)   Resp 18   Ht '5\' 5"'$  (1.651 m)   Wt 52.4 kg   SpO2 100%   BMI 19.22 kg/m  . General: 19 y.o. year-old female well developed well nourished in no acute distress.  Alert and oriented x3. . Cardiovascular: Regular rate and rhythm with no rubs or gallops.  No thyromegaly or JVD noted.   Marland Kitchen Respiratory: Clear to auscultation with no wheezes or rales. Good inspiratory effort. . Abdomen: Soft nontender nondistended with normal bowel sounds x4 quadrants. . Musculoskeletal: No lower extremity edema. 2/4 pulses in all 4 extremities. Marland Kitchen Psychiatry: Mood is appropriate for condition and setting  Discharge Instructions You were cared for by a hospitalist during your hospital stay. If you have any questions about your discharge medications or the care you received while you were in the hospital after you are discharged, you can call the unit and asked to speak with the hospitalist on call if the hospitalist that took care of you is not available. Once you are discharged, your primary care physician will handle any further medical issues. Please note that NO REFILLS for any discharge medications will be authorized once you are discharged, as it is imperative that you return to your primary care physician (or establish a relationship with a primary care physician if you do not have one) for your aftercare needs so that they can reassess your need for medications and monitor your lab values.   Allergies as of 05/26/2020   No Known Allergies     Medication List    TAKE these medications   buprenorphine-naloxone 8-2 mg Subl SL tablet Commonly known as: SUBOXONE Place 1 tablet under the tongue 2 (two) times daily for 3 days.   carvedilol 6.25 MG tablet Commonly known as:  COREG Take 1 tablet (6.25 mg total) by mouth 2 (two) times daily with a meal.   polyethylene glycol 17 g packet Commonly known as: MIRALAX / GLYCOLAX Take 17 g by mouth daily as needed for moderate constipation.   QUEtiapine 50 MG tablet Commonly known as: SEROQUEL Take 1 tablet (50 mg total) by mouth 2 (two) times daily.      No Known Allergies  Follow-up Information    Michel Bickers, MD. Call in 1 day(s).   Specialty: Infectious Diseases Why: Please call for a post hospital follow up appointment Contact information: [address] [place] [address] [phone]        [facility]. Call.   Why: please call for a post hospital follow up appointment. Contact information: [address] [place] [address] [phone]               The results of significant diagnostics from this hospitalization (including imaging, microbiology, ancillary and laboratory) are listed below for reference.    Significant Diagnostic Studies: CT ANGIO CHEST PE W OR WO CONTRAST  Result Date: 05/07/2020 CLINICAL DATA:  PE suspected. High probability. One week of shortness of breath. O2 saturation 79% heroin use last night. EXAM: CT ANGIOGRAPHY CHEST WITH CONTRAST TECHNIQUE: Multidetector CT imaging of the chest was performed  using the standard protocol during bolus administration of intravenous contrast. Multiplanar CT image reconstructions and MIPs were obtained to evaluate the vascular anatomy. CONTRAST:  87mL OMNIPAQUE IOHEXOL 350 MG/ML SOLN COMPARISON:  Chest x-ray 04/28/2020, CT on 04/26/2020 FINDINGS: Cardiovascular: Heart size is normal. No pericardial effusion. Pulmonary arteries are well opacified by contrast bolus. No acute pulmonary embolus. Mediastinum/Nodes: Stable, residual thymic tissue no significant adenopathy. Esophagus is normal in appearance. Bilateral prominence of axillary lymph nodes, symmetric in size and  measuring up to follow-up 1.2 centimeters, possibly reactive. The visualized portion of the thyroid gland has a normal appearance. Lungs/Pleura: Significant interval improvement in RIGHT pneumothorax, now only a small air collection in the posterior RIGHT LOWER lobe, measuring 2.5 x 1.5 centimeters. There are moderate bilateral pleural effusions, increased over prior study. Cystic spaces are identified in the LEFT UPPER lobe, consistent with pneumatoceles. Numerous irregular cavitary lesions are again identified throughout the lungs, consistent with septic emboli. A nodule in the LEFT UPPER lobe measures 7 millimeters on image 32 of series 9, possibly representing early or healing cavitary lesion. No frank consolidation. No evidence for pulmonary edema. There is peribronchial thickening. Upper Abdomen: Unremarkable. Musculoskeletal: Remote fracture of the LEFT 7th rib. No acute fracture. Review of the MIP images confirms the above findings. IMPRESSION: 1. Technically adequate exam showing no acute pulmonary embolus. 2. Significant interval improvement in RIGHT pneumothorax, now only a small air collection in the posterior RIGHT LOWER lobe. 3. Numerous irregular cavitary lesions throughout the lungs, consistent with septic emboli. 4. Moderate bilateral pleural effusions, increased over prior study. 5. Bilateral axillary lymph nodes, possibly reactive. 6. Remote fracture of the LEFT 7th rib. Electronically Signed   By: Norva Pavlov M.D.   On: 05/07/2020 10:30   DG CHEST PORT 1 VIEW  Result Date: 05/13/2020 CLINICAL DATA:  Recent cavitary pneumonia.  Recent pneumothorax. EXAM: PORTABLE CHEST 1 VIEW COMPARISON:  CT angiogram chest May 07, 2020; chest radiograph Apr 28, 2020 FINDINGS: Question small loculated pneumothorax versus bulla right base, stable. Scattered apparent bullae elsewhere, also present on recent CT. Areas of mild scarring noted. Atelectatic change is present in the left base. No well-defined  consolidation evident. The cavitary nodular lesions seen in the upper lobes on recent CT are not seen by radiography. Heart is upper normal in size with pulmonary vascularity normal. No adenopathy. No bone lesions IMPRESSION: There is scattered bullae and scarring. Question focal bulla versus small loculated pneumothorax right base, stable. No larger pneumothorax evident. Atelectatic change noted left base. Small cavitary nodular opacity seen in the upper lobes on recent CT not appreciable radiography. No consolidation. Heart is normal in size.  No adenopathy appreciable. Electronically Signed   By: Bretta Bang III M.D.   On: 05/13/2020 13:21   DG CHEST PORT 1 VIEW  Result Date: 04/28/2020 CLINICAL DATA:  Cough and shortness of breath.  Chest tube removal. EXAM: PORTABLE CHEST 1 VIEW COMPARISON:  04/27/2020 FINDINGS: Right pleural drain is been removed in the interval. No evidence for pneumothorax. The diffuse airspace opacity in the mid and lower right lung is similar to prior. Lateral pleural thickening/fluid evident. Vascular congestion noted bilaterally with left base patchy consolidative opacity. IMPRESSION: Interval removal of right chest tube without substantial residual pneumothorax. Diffuse right and patchy left airspace disease consistent with known pneumonia. Electronically Signed   By: Kennith Center M.D.   On: 04/28/2020 08:31   DG CHEST PORT 1 VIEW  Result Date: 04/27/2020 CLINICAL DATA:  History of  pneumothorax EXAM: PORTABLE CHEST 1 VIEW COMPARISON:  Chest radiograph 04/27/2020 FINDINGS: Monitoring leads overlie the patient. Stable enlarged cardiac and mediastinal contours. Right chest tube remains in position. Similar-appearing right-greater-than-left patchy areas of consolidation. Probable small right pleural effusion. No definite residual right-sided pneumothorax. Osseous structures unremarkable. Old left rib fractures. IMPRESSION: Similar-appearing right-greater-than-left airspace  opacities. Right chest tube remains in place.  No definite pneumothorax. Electronically Signed   By: Lovey Newcomer M.D.   On: 04/27/2020 16:32   DG CHEST PORT 1 VIEW  Result Date: 04/27/2020 CLINICAL DATA:  Pneumothorax. EXAM: PORTABLE CHEST 1 VIEW COMPARISON:  Radiograph and CT yesterday. FINDINGS: Right pigtail catheter remains in place. No visualized pneumothorax. Small amount of subcutaneous emphysema in the right chest wall. There is small volume right pleural fluid versus pleural thickening. Worsening patchy opacities throughout the right perihilar lung. Faint patchy opacities in the left lung better characterized on CT. Small left pleural effusion. Unchanged heart size and mediastinal contours. No acute osseous abnormalities are seen. IMPRESSION: 1. No visualized pneumothorax. Right pigtail catheter remains in place. Small volume subcutaneous emphysema in the right chest wall. 2. Worsening patchy opacities in the right perihilar lung, may represent pneumonia or re-expansion pulmonary edema. 3. Small bilateral pleural effusions. Electronically Signed   By: Keith Rake M.D.   On: 04/27/2020 01:22   VAS [name] LOWER EXTREMITY VENOUS (DVT)  Result Date: 04/28/2020  Lower Venous DVTStudy Indications: Edema.  Risk Factors: None identified. Comparison Study: 03/20/2020 - Negative for DVT. Performing Technologist: Oliver Hum RVT  Examination Guidelines: A complete evaluation includes B-mode imaging, spectral Doppler, color Doppler, and power Doppler as needed of all accessible portions of each vessel. Bilateral testing is considered an integral part of a complete examination. Limited examinations for reoccurring indications may be performed as noted. The reflux portion of the exam is performed with the patient in reverse Trendelenburg.  +---------+---------------+---------+-----------+----------+--------------+ RIGHT    CompressibilityPhasicitySpontaneityPropertiesThrombus Aging  +---------+---------------+---------+-----------+----------+--------------+ CFV      Full           Yes      Yes                                 +---------+---------------+---------+-----------+----------+--------------+ SFJ      Full                                                        +---------+---------------+---------+-----------+----------+--------------+ FV Prox  Full                                                        +---------+---------------+---------+-----------+----------+--------------+ FV Mid   Full                                                        +---------+---------------+---------+-----------+----------+--------------+ FV DistalFull                                                        +---------+---------------+---------+-----------+----------+--------------+  PFV      Full                                                        +---------+---------------+---------+-----------+----------+--------------+ POP      Full           Yes      Yes                                 +---------+---------------+---------+-----------+----------+--------------+ PTV      Full                                                        +---------+---------------+---------+-----------+----------+--------------+ PERO     Full                                                        +---------+---------------+---------+-----------+----------+--------------+   +---------+---------------+---------+-----------+----------+--------------+ LEFT     CompressibilityPhasicitySpontaneityPropertiesThrombus Aging +---------+---------------+---------+-----------+----------+--------------+ CFV      Full           Yes      Yes                                 +---------+---------------+---------+-----------+----------+--------------+ SFJ      Full                                                         +---------+---------------+---------+-----------+----------+--------------+ FV Prox  Full                                                        +---------+---------------+---------+-----------+----------+--------------+ FV Mid   Full                                                        +---------+---------------+---------+-----------+----------+--------------+ FV DistalFull                                                        +---------+---------------+---------+-----------+----------+--------------+ PFV      Full                                                        +---------+---------------+---------+-----------+----------+--------------+  POP      Full           Yes      Yes                                 +---------+---------------+---------+-----------+----------+--------------+ PTV      Full                                                        +---------+---------------+---------+-----------+----------+--------------+ PERO     Full                                                        +---------+---------------+---------+-----------+----------+--------------+     Summary: RIGHT: - There is no evidence of deep vein thrombosis in the lower extremity.  - No cystic structure found in the popliteal fossa.  LEFT: - There is no evidence of deep vein thrombosis in the lower extremity.  - No cystic structure found in the popliteal fossa.  *See table(s) above for measurements and observations. Electronically signed by Servando Snare MD on 04/28/2020 at 3:39:39 PM.    Final    ECHOCARDIOGRAM LIMITED  Result Date: 04/27/2020    ECHOCARDIOGRAM LIMITED REPORT   Patient Name:   KENEISHA HECKART Date of Exam: 04/27/2020 Medical Rec #:  [id number]     Height:       65.0 in Accession #:    [id number]    Weight:       108.5 lb Date of Birth:  2001-02-26    BSA:          1.525 m Patient Age:    18 years      BP:           90/65 mmHg Patient Gender: F             HR:            110 bpm. Exam Location:  Inpatient Procedure: Limited Echo, Cardiac Doppler and Color Doppler Indications:    Endocarditis  History:        Patient has prior history of Echocardiogram examinations, most                 recent 03/21/2020. Endocarditis and TV vegetation 03/21/20;                 Signs/Symptoms:Shortness of Breath, Bacteremia and MRSA. IVDU,                 COVID.  Sonographer:    Dustin Flock Referring Phys: [place]  1. There is a tricuspid valve vegetation which appears to be attached to the posterior leaflet that measures up to 0.9 cm x 0.5 cm. It is more apparent on this study than the last. It is best seen on subcostal views. 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 Unable to exclude leaflet perforation. Given progression of TR, TEE is recommended for better characterization. The tricuspid valve is abnormal. Tricuspid valve regurgitation is severe.  2. Left ventricular ejection fraction, by estimation, is 60 to 65%. The left ventricle has normal function.  3. Right ventricular systolic function is hyperdynamic. The right ventricular size is mildly enlarged. There is mildly elevated pulmonary artery systolic pressure. The estimated right ventricular systolic pressure is 13.0 mmHg.  4. Right atrial size was severely dilated.  5. The mitral valve is grossly normal. Trivial mitral valve regurgitation. No evidence of mitral stenosis.  6. The aortic valve is tricuspid. Aortic valve regurgitation is not visualized. No aortic stenosis is present.  7. The inferior vena cava is normal in size with greater than 50% respiratory variability, suggesting right atrial pressure of 3 mmHg. 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 TEE is recommended. FINDINGS  Left Ventricle: Left ventricular ejection fraction, by estimation, is 60 to 65%. The left ventricle has normal function. The left ventricular internal cavity size was normal  in size. There is no left ventricular hypertrophy. Right Ventricle: The right ventricular size is mildly enlarged. No increase in right ventricular wall thickness. Right ventricular systolic function is hyperdynamic. There is mildly elevated pulmonary artery systolic pressure. The tricuspid regurgitant velocity is 2.99 m/s, and with an assumed right atrial pressure of 3 mmHg, the estimated right ventricular systolic pressure is 86.5 mmHg. Right Atrium: Right atrial size was severely dilated. Pericardium: A small pericardial effusion is present. Mitral Valve: The mitral valve is grossly normal. Trivial mitral valve regurgitation. No evidence of mitral valve stenosis. There is no evidence of mitral valve vegetation. Tricuspid Valve: There is a tricuspid valve vegetation which appears to be attached to the posterior leaflet that measures up to 0.9 cm x 0.5 cm. It is more apparent on this study than the last. It is best seen on subcostal views. 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 Unable to exclude leaflet perforation. Given progression of TR, TEE is recommended for better characterization. The tricuspid valve is abnormal. Tricuspid valve regurgitation is severe. The flow in the hepatic veins is reversed during ventricular systole. Aortic Valve: The aortic valve is tricuspid. Aortic valve regurgitation is not visualized. No aortic stenosis is present. Venous: The inferior vena cava is normal in size with greater than 50% respiratory variability, suggesting right atrial pressure of 3 mmHg. IAS/Shunts: There is left bowing of the interatrial septum, suggestive of elevated right atrial pressure.  LEFT VENTRICLE PLAX 2D LVIDd:         3.80 cm LVIDs:         2.30 cm LV PW:         1.10 cm LV IVS:        1.10 cm  LEFT ATRIUM         Index LA diam:    3.10 cm 2.03 cm/m   AORTA Ao Root diam: 2.50 cm TRICUSPID VALVE TR Peak grad:   35.8 mmHg TR Vmax:        299.00 cm/s Eleonore Chiquito MD Electronically signed by Eleonore Chiquito MD Signature Date/Time: 04/27/2020/12:16:46 PM    Final     Microbiology: No results found for this or any previous visit (from the past 240 hour(s)).   Labs: Basic Metabolic Panel: Recent Labs  Lab 05/20/20 0840 05/21/20 0736 05/22/20 1149 05/24/20 0439  NA  --  135 135 136  K  --  4.4 3.9 4.6  CL  --  103 101 102  CO2  --  '24 26 25  '$ GLUCOSE  --  100* 146* 102*  BUN  --  '9 8 8  '$ CREATININE  --  0.57 0.59 0.58  CALCIUM  --  8.8* 8.9 9.1  MG 1.6* 1.6* 1.6* 1.8  PHOS  --   --  5.1*  --    Liver Function Tests: Recent Labs  Lab 05/22/20 1149  ALBUMIN 2.5*   No results for input(s): LIPASE, AMYLASE in the last 168 hours. No results for input(s): AMMONIA in the last 168 hours. CBC: Recent Labs  Lab 05/21/20 0736 05/24/20 0439  WBC 7.2 7.2  NEUTROABS 2.8 2.6  HGB 9.4* 10.3*  HCT 31.1* 33.4*  MCV 90.9 90.8  PLT 176 149*   Cardiac Enzymes: No results for input(s): CKTOTAL, CKMB, CKMBINDEX, TROPONINI in the last 168 hours. BNP: BNP (last 3 results) Recent Labs    03/25/20 0702  BNP 104.3*    ProBNP (last 3 results) No results for input(s): PROBNP in the last 8760 hours.  CBG: No results for input(s): GLUCAP in the last 168 hours.     Signed:  Kayleen Memos, MD [facility] 05/26/2020, 10:42 AM

## 2020-05-26 NOTE — Progress Notes (Signed)
Candice Jordan was discharged will follow up appointment to [facility] treatment center 05/27/20 at 5am, since taking Suboxone. Phone number for facility: [phone].

## 2020-06-15 ENCOUNTER — Inpatient Hospital Stay: Payer: Medicaid Other | Admitting: Internal Medicine

## 2021-03-16 IMAGING — CT CT ANGIO CHEST
3 of 7 series · 18 of 36 positions shown · IV contrast (omnipaque)
Comparison: Chest x-ray 04/28/2020, CT on 04/26/2020

CLINICAL DATA: PE suspected. High probability. One week of
shortness of breath. O2 saturation 79% heroin use last night.

EXAM:
CT ANGIOGRAPHY CHEST WITH CONTRAST
TECHNIQUE: Multidetector CT imaging of the chest was performed using the
standard protocol during bolus administration of intravenous
contrast. Multiplanar CT image reconstructions and MIPs were
obtained to evaluate the vascular anatomy.
CONTRAST:  75mL OMNIPAQUE IOHEXOL 350 MG/ML SOLN

[Series 8: pe thins · axial · 0.67mm/px · z∈[+1082,+1362]mm · 15 of 458 slices shown]
[im 29/458  lung]
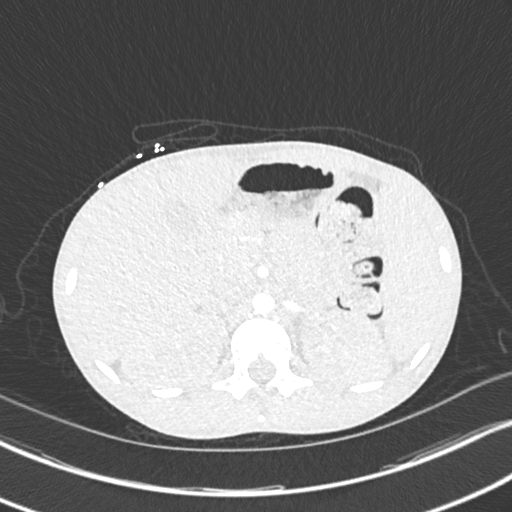
[im 58/458  mediastinal]
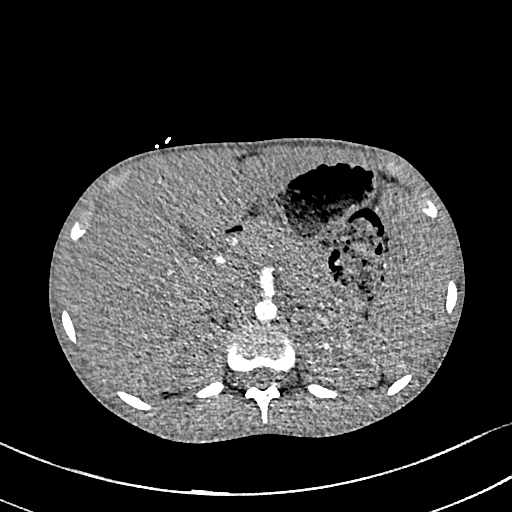
[im 86/458  lung]
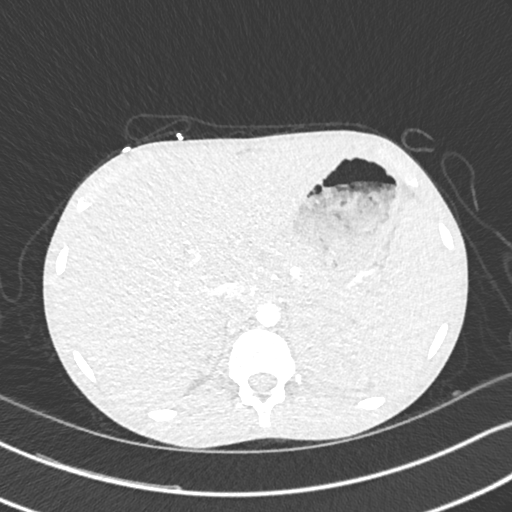
[im 115/458  mediastinal]
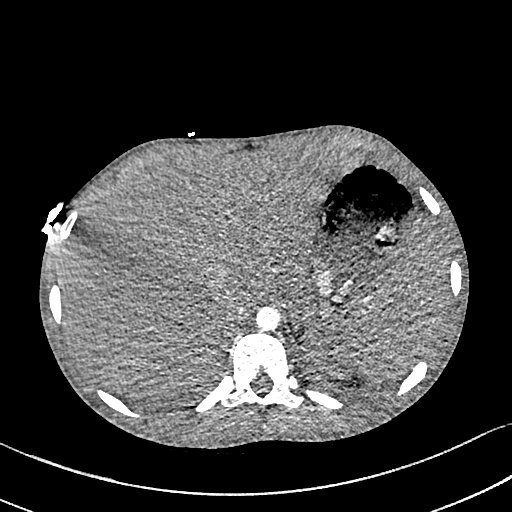
[im 143/458  lung]
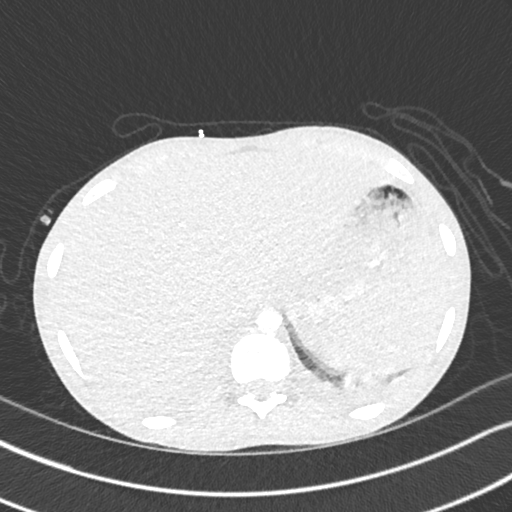
[im 172/458  mediastinal]
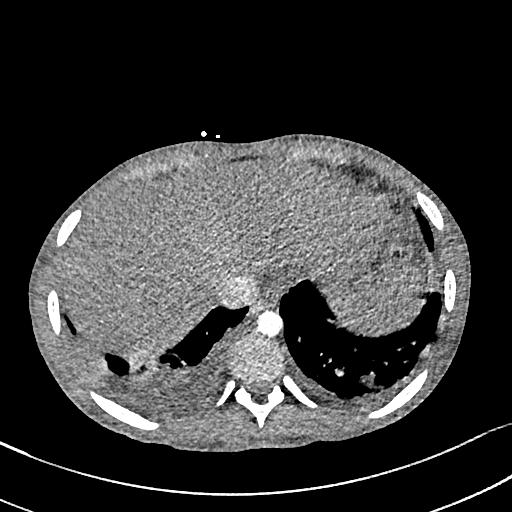
[im 200/458  lung]
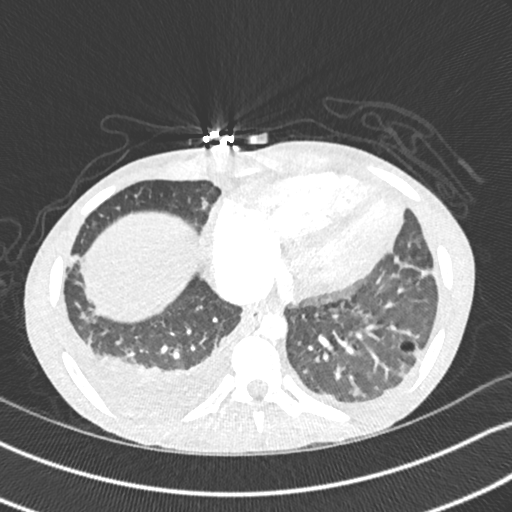
[im 229/458  mediastinal]
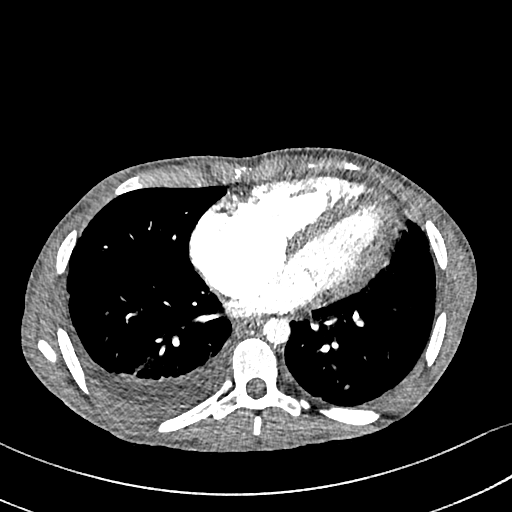
[im 258/458  lung]
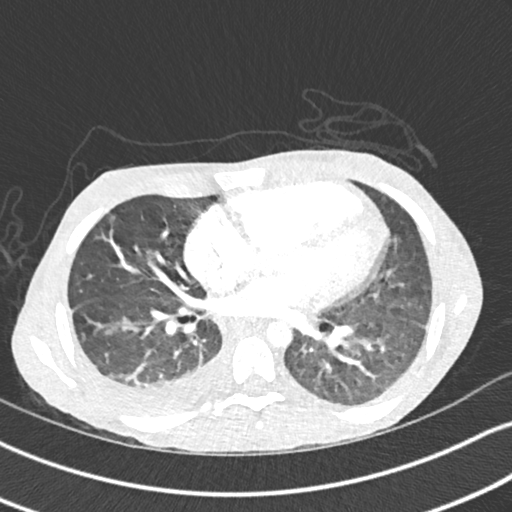
[im 286/458  mediastinal]
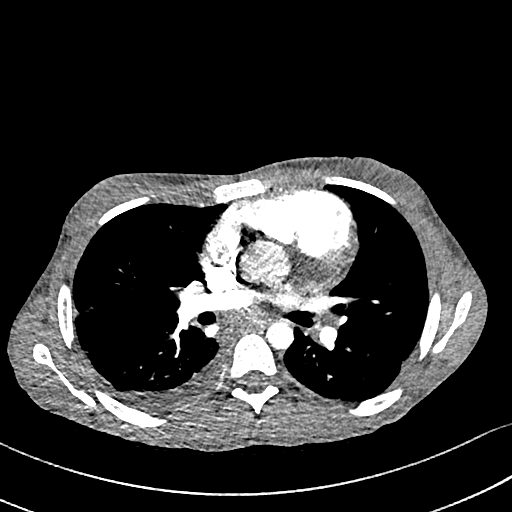
[im 315/458  lung]
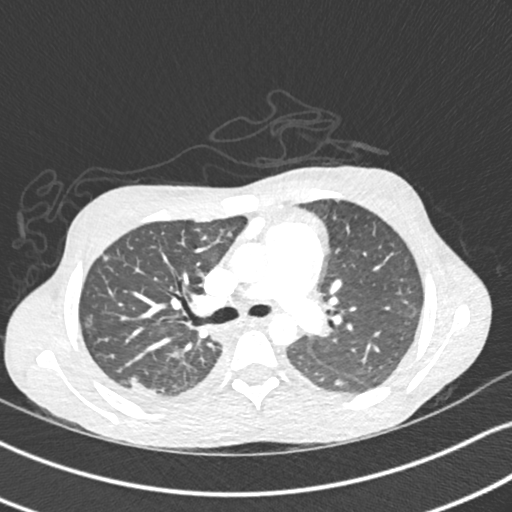
[im 343/458  mediastinal]
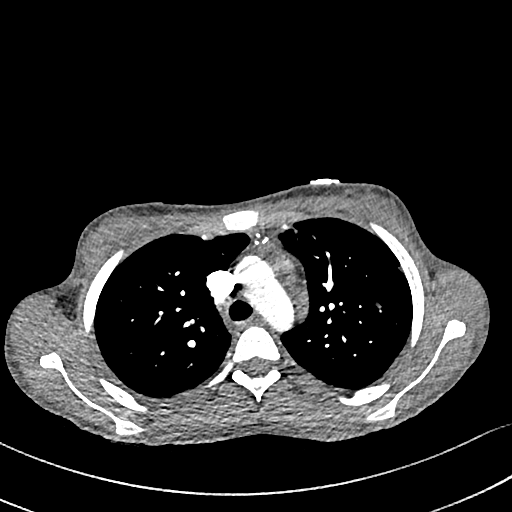
[im 372/458  lung]
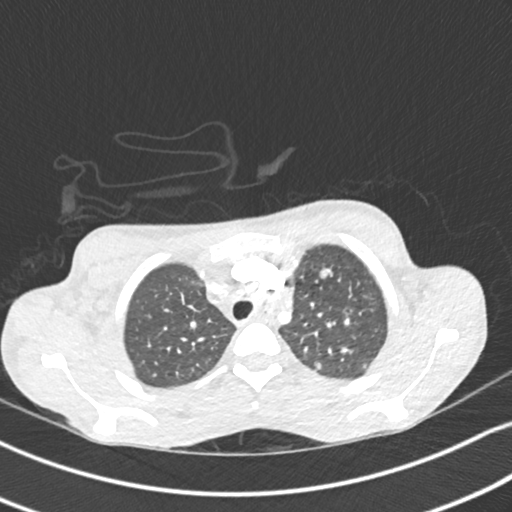
[im 400/458  mediastinal]
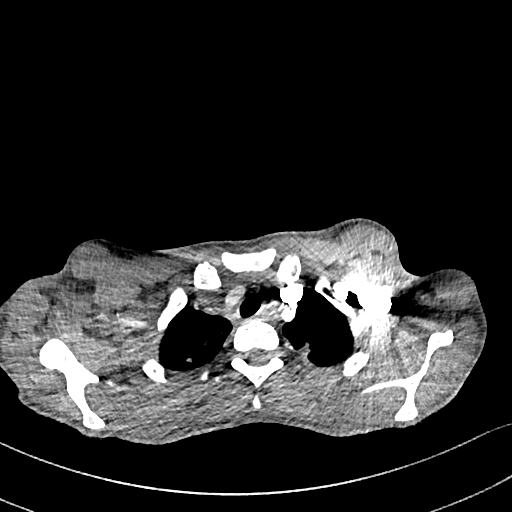
[im 429/458  lung]
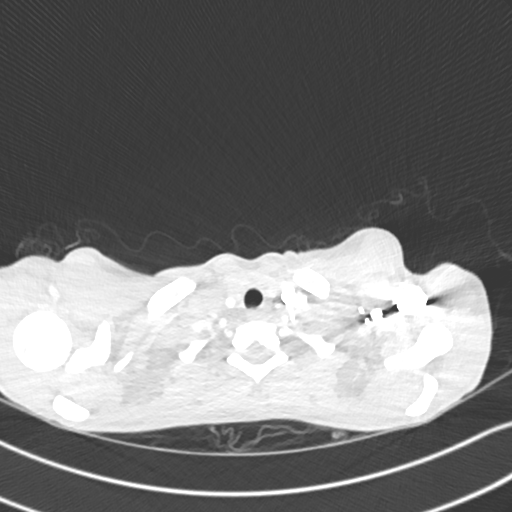

[Series 9: pe lung · axial · 0.67mm/px · z∈[+1200,+1260]mm · 2 of 122 slices shown]
[im 31/122  mediastinal]
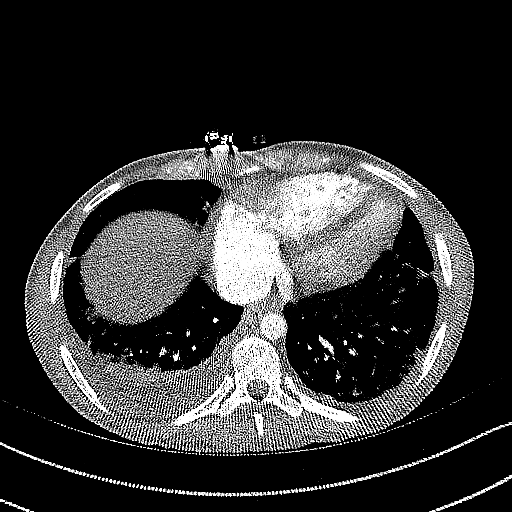
[im 61/122  mediastinal]
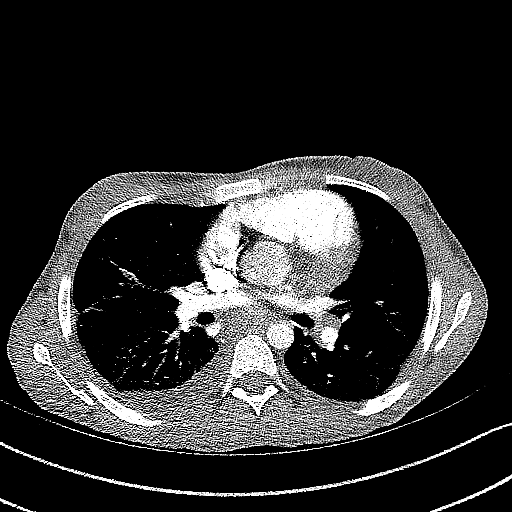

[Series 11: pe 2mm cor · coronal · 0.65mm/px · 1 of 125 slices shown]
[im 63/125  mediastinal]
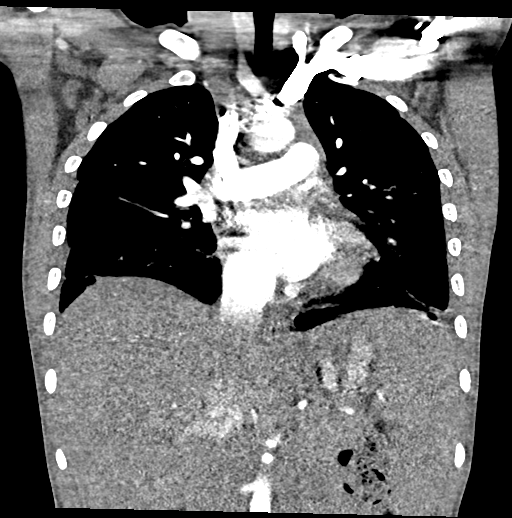

[18 of 36 positions shown; findings below may reference images not displayed]

FINDINGS: Cardiovascular: Heart size is normal. No pericardial effusion.
Pulmonary arteries are well opacified by contrast bolus. No acute
pulmonary embolus.

Mediastinum/Nodes: Stable, residual thymic tissue no significant
adenopathy. Esophagus is normal in appearance. Bilateral prominence
of axillary lymph nodes, symmetric in size and measuring up to
follow-up 1.2 centimeters, possibly reactive. The visualized portion
of the thyroid gland has a normal appearance.

Lungs/Pleura: Significant interval improvement in RIGHT
pneumothorax, now only a small air collection in the posterior RIGHT
LOWER lobe, measuring 2.5 x 1.5 centimeters. There are moderate
bilateral pleural effusions, increased over prior study. Cystic
spaces are identified in the LEFT UPPER lobe, consistent with
pneumatoceles. Numerous irregular cavitary lesions are again
identified throughout the lungs, consistent with septic emboli. A
nodule in the LEFT UPPER lobe measures 7 millimeters on image 32 of
series 9, possibly representing early or healing cavitary lesion. No
frank consolidation. No evidence for pulmonary edema. There is
peribronchial thickening.

Upper Abdomen: Unremarkable.

Musculoskeletal: Remote fracture of the LEFT 7th rib. No acute
fracture.

Review of the MIP images confirms the above findings.
IMPRESSION: 1. Technically adequate exam showing no acute pulmonary embolus.
2. Significant interval improvement in RIGHT pneumothorax, now only
a small air collection in the posterior RIGHT LOWER lobe.
3. Numerous irregular cavitary lesions throughout the lungs,
consistent with septic emboli.
4. Moderate bilateral pleural effusions, increased over prior study.
5. Bilateral axillary lymph nodes, possibly reactive.
6. Remote fracture of the LEFT 7th rib.

## 2021-03-22 IMAGING — DX DG CHEST 1V PORT
1 series · 1 of 1 positions shown · non-contrast
Comparison: CT angiogram chest May 07, 2020; chest radiograph April 28, 2020

CLINICAL DATA: Recent cavitary pneumonia.  Recent pneumothorax.

EXAM:
PORTABLE CHEST 1 VIEW

[chest]
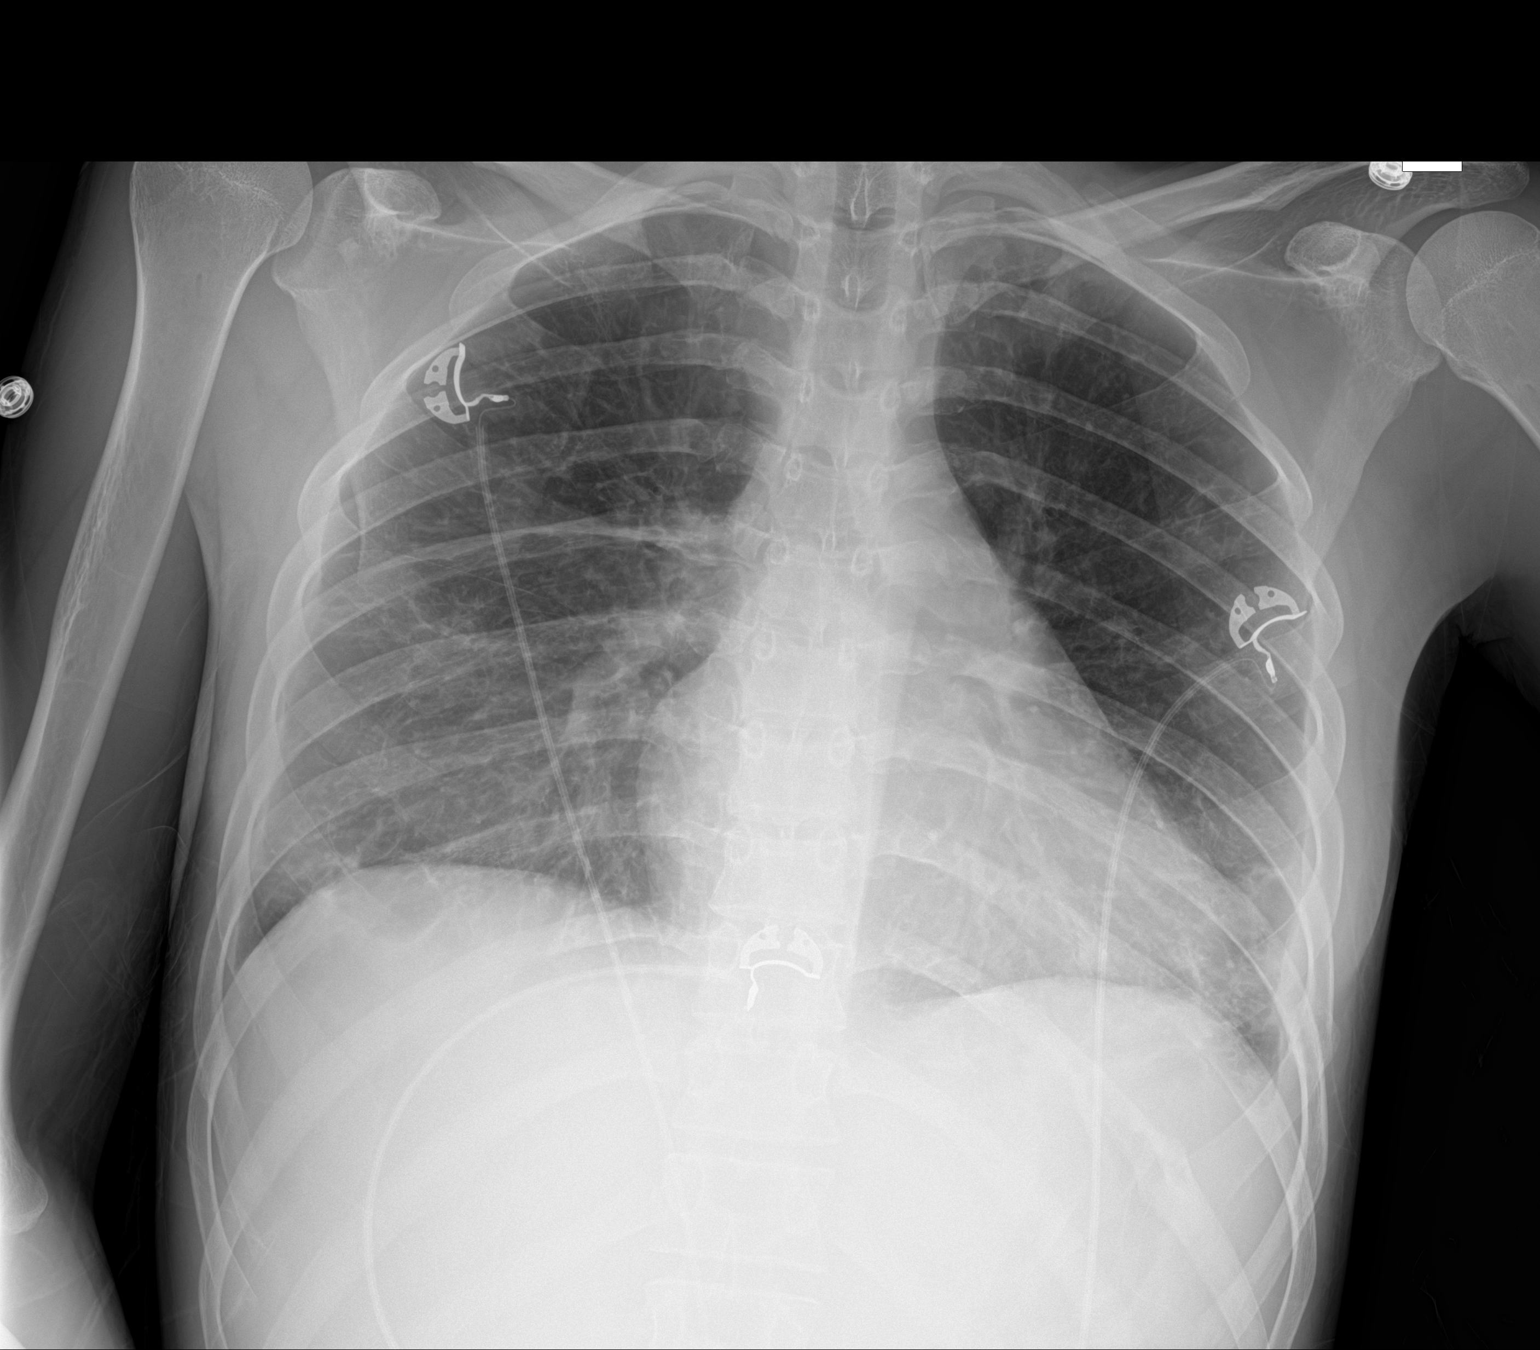

[1 of 1 positions shown; findings below may reference images not displayed]

FINDINGS: Question small loculated pneumothorax versus bulla right base,
stable. Scattered apparent bullae elsewhere, also present on recent
CT. Areas of mild scarring noted. Atelectatic change is present in
the left base. No well-defined consolidation evident. The cavitary
nodular lesions seen in the upper lobes on recent CT are not seen by
radiography. Heart is upper normal in size with pulmonary
vascularity normal. No adenopathy. No bone lesions
IMPRESSION: There is scattered bullae and scarring. Question focal bulla versus
small loculated pneumothorax right base, stable. No larger
pneumothorax evident. Atelectatic change noted left base. Small
cavitary nodular opacity seen in the upper lobes on recent CT not
appreciable radiography. No consolidation.

Heart is normal in size.  No adenopathy appreciable.

## 2022-10-29 ENCOUNTER — Inpatient Hospital Stay: Admit: 2022-10-29 | Payer: Medicaid Other | Admitting: Internal Medicine

## 2022-10-29 ENCOUNTER — Encounter (HOSPITAL_COMMUNITY): Payer: Self-pay

## 2022-10-30 ENCOUNTER — Other Ambulatory Visit: Payer: Self-pay

## 2022-10-30 ENCOUNTER — Encounter (HOSPITAL_COMMUNITY): Payer: Self-pay

## 2022-10-30 ENCOUNTER — Emergency Department (HOSPITAL_COMMUNITY): Payer: Medicaid Other

## 2022-10-30 ENCOUNTER — Inpatient Hospital Stay (HOSPITAL_COMMUNITY)
Admission: EM | Admit: 2022-10-30 | Discharge: 2022-10-31 | DRG: 560 | Discharge status: Against Medical Advice | Payer: Medicaid Other | Attending: Internal Medicine | Admitting: Internal Medicine

## 2022-10-30 DIAGNOSIS — T84623A Infection and inflammatory reaction due to internal fixation device of left tibia, initial encounter: Principal | ICD-10-CM | POA: Diagnosis present

## 2022-10-30 DIAGNOSIS — F17210 Nicotine dependence, cigarettes, uncomplicated: Secondary | ICD-10-CM | POA: Diagnosis present

## 2022-10-30 DIAGNOSIS — E876 Hypokalemia: Secondary | ICD-10-CM | POA: Diagnosis not present

## 2022-10-30 DIAGNOSIS — L03116 Cellulitis of left lower limb: Secondary | ICD-10-CM | POA: Diagnosis present

## 2022-10-30 DIAGNOSIS — R7401 Elevation of levels of liver transaminase levels: Secondary | ICD-10-CM | POA: Diagnosis present

## 2022-10-30 DIAGNOSIS — M869 Osteomyelitis, unspecified: Secondary | ICD-10-CM | POA: Diagnosis present

## 2022-10-30 DIAGNOSIS — D649 Anemia, unspecified: Secondary | ICD-10-CM | POA: Diagnosis not present

## 2022-10-30 DIAGNOSIS — T847XXA Infection and inflammatory reaction due to other internal orthopedic prosthetic devices, implants and grafts, initial encounter: Secondary | ICD-10-CM

## 2022-10-30 DIAGNOSIS — Y848 Other medical procedures as the cause of abnormal reaction of the patient, or of later complication, without mention of misadventure at the time of the procedure: Secondary | ICD-10-CM | POA: Diagnosis present

## 2022-10-30 DIAGNOSIS — Z23 Encounter for immunization: Secondary | ICD-10-CM | POA: Diagnosis not present

## 2022-10-30 DIAGNOSIS — Z5329 Procedure and treatment not carried out because of patient's decision for other reasons: Secondary | ICD-10-CM | POA: Diagnosis present

## 2022-10-30 DIAGNOSIS — L02416 Cutaneous abscess of left lower limb: Secondary | ICD-10-CM | POA: Diagnosis present

## 2022-10-30 DIAGNOSIS — L089 Local infection of the skin and subcutaneous tissue, unspecified: Principal | ICD-10-CM

## 2022-10-30 HISTORY — DX: Other psychoactive substance use, unspecified, uncomplicated: F19.90

## 2022-10-30 HISTORY — DX: Displaced bicondylar fracture of left tibia, initial encounter for closed fracture: S82.142A

## 2022-10-30 LAB — COMPREHENSIVE METABOLIC PANEL
ALT: 79 U/L — ABNORMAL HIGH (ref 0–44)
AST: 102 U/L — ABNORMAL HIGH (ref 15–41)
Albumin: 3.1 g/dL — ABNORMAL LOW (ref 3.5–5.0)
Alkaline Phosphatase: 70 U/L (ref 38–126)
Anion gap: 9 (ref 5–15)
BUN: 10 mg/dL (ref 6–20)
CO2: 25 mmol/L (ref 22–32)
Calcium: 8.9 mg/dL (ref 8.9–10.3)
Chloride: 100 mmol/L (ref 98–111)
Creatinine, Ser: 0.71 mg/dL (ref 0.44–1.00)
GFR, Estimated: >60 mL/min (ref >60)
Glucose, Bld: 102 mg/dL — ABNORMAL HIGH (ref 70–99)
Potassium: 3.4 mmol/L — ABNORMAL LOW (ref 3.5–5.1)
Sodium: 134 mmol/L — ABNORMAL LOW (ref 135–145)
Total Bilirubin: 0.3 mg/dL (ref 0.3–1.2)
Total Protein: 8.1 g/dL (ref 6.5–8.1)

## 2022-10-30 LAB — MAGNESIUM: Magnesium: 1.9 mg/dL (ref 1.7–2.4)

## 2022-10-30 LAB — IRON AND TIBC
Iron: 25 ug/dL — ABNORMAL LOW (ref 28–170)
Saturation Ratios: 7 % — ABNORMAL LOW (ref 10.4–31.8)
TIBC: 347 ug/dL (ref 250–450)
UIBC: 322 ug/dL

## 2022-10-30 LAB — CBC WITH DIFFERENTIAL/PLATELET
Abs Immature Granulocytes: 0.02 10*3/uL (ref 0.00–0.07)
Basophils Absolute: 0.1 10*3/uL (ref 0.0–0.1)
Basophils Relative: 1 %
Eosinophils Absolute: 0.2 10*3/uL (ref 0.0–0.5)
Eosinophils Relative: 2 %
HCT: 33.2 % — ABNORMAL LOW (ref 36.0–46.0)
Hemoglobin: 10.5 g/dL — ABNORMAL LOW (ref 12.0–15.0)
Immature Granulocytes: 0 %
Lymphocytes Relative: 41 %
Lymphs Abs: 3.7 10*3/uL (ref 0.7–4.0)
MCH: 28.9 pg (ref 26.0–34.0)
MCHC: 31.6 g/dL (ref 30.0–36.0)
MCV: 91.5 fL (ref 80.0–100.0)
Monocytes Absolute: 0.8 10*3/uL (ref 0.1–1.0)
Monocytes Relative: 9 %
Neutro Abs: 4.2 10*3/uL (ref 1.7–7.7)
Neutrophils Relative %: 47 %
Platelets: 334 10*3/uL (ref 150–400)
RBC: 3.63 MIL/uL — ABNORMAL LOW (ref 3.87–5.11)
RDW: 13.2 % (ref 11.5–15.5)
WBC: 8.9 10*3/uL (ref 4.0–10.5)
nRBC: 0 % (ref 0.0–0.2)

## 2022-10-30 LAB — TYPE AND SCREEN
ABO/RH(D): O POS
Antibody Screen: NEGATIVE

## 2022-10-30 LAB — ABO/RH: ABO/RH(D): O POS

## 2022-10-30 LAB — C-REACTIVE PROTEIN: CRP: 6.7 mg/dL — ABNORMAL HIGH (ref <1.0)

## 2022-10-30 LAB — SEDIMENTATION RATE: Sed Rate: 75 mm/hr — ABNORMAL HIGH (ref 0–22)

## 2022-10-30 LAB — FERRITIN: Ferritin: 79 ng/mL (ref 11–307)

## 2022-10-30 LAB — LACTIC ACID, PLASMA: Lactic Acid, Venous: 0.7 mmol/L (ref 0.5–1.9)

## 2022-10-30 MED ORDER — SODIUM CHLORIDE 0.9 % IV SOLN: 1.0000 g | INTRAVENOUS | Status: DC

## 2022-10-30 MED ORDER — MELATONIN 3 MG PO TABS: 3.0000 mg | ORAL_TABLET | Freq: Every evening | ORAL | Status: DC | PRN

## 2022-10-30 MED ORDER — POTASSIUM CHLORIDE CRYS ER 20 MEQ PO TBCR
40.0000 meq | EXTENDED_RELEASE_TABLET | Freq: Once | ORAL | Status: AC
  Administered 2022-10-30: 40 meq via ORAL
  Filled 2022-10-30: qty 2

## 2022-10-30 MED ORDER — VANCOMYCIN HCL IN DEXTROSE 1-5 GM/200ML-% IV SOLN
1000.0000 mg | Freq: Once | INTRAVENOUS | Status: AC
  Administered 2022-10-30: 1000 mg via INTRAVENOUS
  Filled 2022-10-30: qty 200

## 2022-10-30 MED ORDER — LACTATED RINGERS IV SOLN: INTRAVENOUS | Status: AC

## 2022-10-30 MED ORDER — VANCOMYCIN HCL 750 MG/150ML IV SOLN
750.0000 mg | Freq: Two times a day (BID) | INTRAVENOUS | Status: DC
  Administered 2022-10-31: 750 mg via INTRAVENOUS
  Filled 2022-10-30: qty 150

## 2022-10-30 MED ORDER — ACETAMINOPHEN 650 MG RE SUPP: 650.0000 mg | Freq: Four times a day (QID) | RECTAL | Status: DC | PRN

## 2022-10-30 MED ORDER — FENTANYL CITRATE PF 50 MCG/ML IJ SOSY: 50.0000 ug | PREFILLED_SYRINGE | INTRAMUSCULAR | Status: DC | PRN

## 2022-10-30 MED ORDER — ACETAMINOPHEN 325 MG PO TABS: 650.0000 mg | ORAL_TABLET | Freq: Four times a day (QID) | ORAL | Status: DC | PRN

## 2022-10-30 MED ORDER — ONDANSETRON HCL 4 MG/2ML IJ SOLN: 4.0000 mg | Freq: Four times a day (QID) | INTRAMUSCULAR | Status: DC | PRN

## 2022-10-30 MED ORDER — SODIUM CHLORIDE 0.9 % IV SOLN
2.0000 g | Freq: Once | INTRAVENOUS | Status: AC
  Administered 2022-10-30: 2 g via INTRAVENOUS
  Filled 2022-10-30: qty 20

## 2022-10-30 MED ORDER — NALOXONE HCL 0.4 MG/ML IJ SOLN: 0.4000 mg | INTRAMUSCULAR | Status: DC | PRN

## 2022-10-30 NOTE — ED Notes (Signed)
Patient noted to have medium/ large wound on the left lower leg with skin that is not intact in portions. Redness and swelling surrounding the left ankle and spreading to the back of the leg. Patient reports pain in her left leg from her ankle to the back of her calf. Patient reports that she has a history of cellulitis.

## 2022-10-30 NOTE — ED Provider Notes (Signed)
[facility] EMERGENCY DEPARTMENT Provider Note   CSN: [id number] Arrival date & time: 10/30/22  0456     History  Chief Complaint  Patient presents with   Ankle Wound Infection     Candice Jordan is a 21 y.o. female. With no significant past medical history presents to the emergency department with ankle wound.  States that she has had worsening left lower leg wound infection over the past two days. Has had progressive swelling, redness, pain and purulent drainage that has started on the medial left leg. She denies objective fever, nausea, vomiting, chills. She states yesterday she went to [facility] ED and was initially given antibiotics and discharged. She states she returned later that day with worse symptoms. She states they did a CT and she was told she "had infection in her bone." She states they recommended she stay to be transferred to [facility] but that it would take a while so she signed out AMA and came here instead. More remotely, states she had surgery with rod placement in her left tibia 5 years ago. Per patient has had 6 episodes of cellulitis over the same area. She has history of IVDU. States she has never injected into her legs. Last IVDU was 6 months ago.   HPI     Home Medications Prior to Admission medications   Not on File      Allergies    Patient has no known allergies.    Review of Systems   Review of Systems  Musculoskeletal:  Positive for joint swelling.  Skin:  Positive for rash and wound.  All other systems reviewed and are negative.   Physical Exam Updated Vital Signs BP 128/74   Pulse 80   Temp 98.8 F (37.1 C)   Resp 18   Ht _0  (1.651 m)   Wt 54.4 kg   SpO2 98%   BMI 19.97 kg/m  Physical Exam Vitals and nursing note reviewed.  Constitutional:      General: She is not in acute distress.    Appearance: Normal appearance. She is not ill-appearing or toxic-appearing.  HENT:     Head: Normocephalic.  Eyes:     General:  No scleral icterus.    Extraocular Movements: Extraocular movements intact.  Cardiovascular:     Pulses: Normal pulses.  Pulmonary:     Effort: Pulmonary effort is normal. No respiratory distress.  Musculoskeletal:     Cervical back: Neck supple.     Comments: Left lower extremity from mid shin to the toes is diffusely swollen. Non pitting edema. There is erythema over the medial and anterior portion of the shin. There is a wound just superior to the medial malleolus. No drainage at this time.   Skin:    General: Skin is warm and dry.     Capillary Refill: Capillary refill takes less than 2 seconds.     Findings: Erythema present.  Neurological:     General: No focal deficit present.     Mental Status: She is alert and oriented to person, place, and time. Mental status is at baseline.  Psychiatric:        Mood and Affect: Mood normal.        Behavior: Behavior normal.        Thought Content: Thought content normal.        Judgment: Judgment normal.     ED Results / Procedures / Treatments   Labs (all labs ordered are listed, but only abnormal  results are displayed) Labs Reviewed  COMPREHENSIVE METABOLIC PANEL - Abnormal; Notable for the following components:      Result Value   Sodium 134 (*)    Potassium 3.4 (*)    Glucose, Bld 102 (*)    Albumin 3.1 (*)    AST 102 (*)    ALT 79 (*)    All other components within normal limits  CBC WITH DIFFERENTIAL/PLATELET - Abnormal; Notable for the following components:   RBC 3.63 (*)    Hemoglobin 10.5 (*)    HCT 33.2 (*)    All other components within normal limits  C-REACTIVE PROTEIN - Abnormal; Notable for the following components:   CRP 6.7 (*)    All other components within normal limits  SEDIMENTATION RATE - Abnormal; Notable for the following components:   Sed Rate 75 (*)    All other components within normal limits  CULTURE, BLOOD (ROUTINE X 2)  CULTURE, BLOOD (ROUTINE X 2)  LACTIC ACID, PLASMA  LACTIC ACID, PLASMA   HCG, QUANTITATIVE, PREGNANCY  CBC WITH DIFFERENTIAL/PLATELET  COMPREHENSIVE METABOLIC PANEL  MAGNESIUM  MAGNESIUM  C-REACTIVE PROTEIN  SEDIMENTATION RATE  PROTIME-INR  I-STAT BETA HCG BLOOD, ED ([facility], [facility], [facility] ONLY)    EKG None  Radiology DG Ankle Complete Left  Result Date: 10/30/2022 CLINICAL DATA:  Ankle wound infection. EXAM: LEFT ANKLE COMPLETE - 3+ VIEW COMPARISON:  Study of 07/18/2021 FINDINGS: Again noted is a healed mid to distal tibial shaft fracture with intramedullary rod fixation. No recent fracture is seen. No findings of significant hardware loosening. There is no erosive bone lesion suspicious for acute osteomyelitis. The ankle mortise is symmetric. Arthritic changes are not seen. There is generalized edema in the mid to lower foreleg extending over the ankle and hindfoot, similar to the previous study. No radiopaque soft tissue foreign body or soft tissue gas are seen. IMPRESSION: 1. Generalized edema in the mid to lower foreleg extending over the ankle and hindfoot, similar to the previous study. No radiographic evidence of acute osteomyelitis. 2. Healed mid to distal tibial shaft fracture with intramedullary rod fixation. Electronically Signed   By: Telford Nab M.D.   On: 10/30/2022 06:39    Procedures Procedures   Medications Ordered in ED Medications  cefTRIAXone (ROCEPHIN) 2 g in sodium chloride 0.9 % 100 mL IVPB (has no administration in time range)  vancomycin (VANCOCIN) IVPB 1000 mg/200 mL premix (has no administration in time range)  vancomycin (VANCOREADY) IVPB 750 mg/150 mL (has no administration in time range)  cefTRIAXone (ROCEPHIN) 1 g in sodium chloride 0.9 % 100 mL IVPB (has no administration in time range)  acetaminophen (TYLENOL) tablet 650 mg (has no administration in time range)    Or  acetaminophen (TYLENOL) suppository 650 mg (has no administration in time range)  melatonin tablet 3 mg (has no administration in time range)  naloxone (NARCAN)  injection 0.4 mg (has no administration in time range)  fentaNYL (SUBLIMAZE) injection 50 mcg (has no administration in time range)  ondansetron (ZOFRAN) injection 4 mg (has no administration in time range)    ED Course/ Medical Decision Making/ A&P Clinical Course as of 10/30/22 2137  Tue Oct 30, 2022  2117 Dr. Velia Meyer agrees to admit patient.  [LA]    Clinical Course User Index [LA] Mickie Hillier, PA-C                           Medical Decision Making Amount  and/or Complexity of Data Reviewed Labs: ordered.  Risk Prescription drug management. Decision regarding hospitalization.  Initial Impression and Ddx 21 year old female who presents to the emergency department with left lower extremity wound and leg swelling.  On my initial exam she is nontoxic in appearance.  She does have diffuse edema to the left lower extremity from the mid shin down with medial wound and surrounding cellulitis.  No systemic symptoms.  She had labs from triage ordered including ESR, CRP.  Additionally she had a plain film of the leg. Patient PMH that increases complexity of ED encounter: IV drug use  Interpretation of Diagnostics I independent reviewed and interpreted the labs as followed: CRP 6.7, sed rate 75, lactic negative  - I independently visualized the following imaging with scope of interpretation limited to determining acute life threatening conditions related to emergency care: Plain film of the left ankle, which revealed tissue swelling, no osteomyelitis  Patient Reassessment and Ultimate Disposition/Management Inflammatory markers are elevated.  There is no leukocytosis or fever currently.  Her plain film does not show that she has osteomyelitis but from her recollection at [facility] ED she had a CT performed which showed a "bone infection."  She does have an obvious cellulitis with a focal wound over the medial lower extremity just superior to the medial malleolus.  It was draining purulent  fluid.  Not currently draining.  Given that she has history of hardware in the tibia as well as IV drug use, I do have low threshold for treating her with IV antibiotics vancomycin and Rocephin. On merging chart review, appears she was supposed to direct admit yesterday to Dr. Velia Meyer to see general surgery for evaluation. Will admit again to hospitalist and start vancomycin, rocephin in ED.   Consulted and spoke with Dr. Velia Meyer, hospitalist who agrees to admit the patient. Patient management required discussion with the following services or consulting groups:  Hospitalist Service  Complexity of Problems Addressed Acute illness or injury that poses threat of life of bodily function  Additional Data Reviewed and Analyzed Further history obtained from: Past medical history and medications listed in the EMR, Prior ED visit notes, Recent discharge summary, Recent Consult notes, Care Everywhere, and Prior labs/imaging results  Patient Encounter Risk Assessment SDOH impact on management, Use of parenteral controlled substances, and Consideration of hospitalization  Final Clinical Impression(s) / ED Diagnoses Final diagnoses:  Wound infection    Rx / DC Orders ED Discharge Orders     None         Mickie Hillier, PA-C 10/30/22 2140    Valarie Merino, MD 10/31/22 0001

## 2022-10-30 NOTE — ED Notes (Signed)
Patient ambulates to restroom to change into looser pants so that this RN can assess the legs in the hallway.

## 2022-10-30 NOTE — Progress Notes (Signed)
Pharmacy Antibiotic Note  Candice Jordan is a 21 y.o. female admitted on 10/30/2022 with L ankle and leg cellulitis.  Pharmacy has been consulted for Vancomycin dosing. Rocephin x 1 also given in ED.  Plan: Vancomycin 1000 mg IV now then 750 mg IV Q 12 hrs. Goal AUC 400-550. Expected AUC: 460 SCr used: 0.8 Will f/u renal function, micro data, and pt's clinical condition Vanc levels prn   Height: 5\' 5"  (165.1 cm) Weight: 54.4 kg (120 lb) IBW/kg (Calculated) : 57  Temp (24hrs), Avg:98 F (36.7 C), Min:97.5 F (36.4 C), Max:98.8 F (37.1 C)  Recent Labs  Lab 10/30/22 0602  WBC 8.9  CREATININE 0.71  LATICACIDVEN 0.7    Estimated Creatinine Clearance: 94.7 mL/min (by C-G formula based on SCr of 0.71 mg/dL).    No Known Allergies  Antimicrobials this admission: 11/28 Vanc >>  11/28 Rocephin x1  Microbiology results: 11/28 BCx:   Thank you for allowing pharmacy to be a part of this patient's care.  12/28, PharmD, BCPS Please see amion for complete clinical pharmacist phone list 10/30/2022 9:02 PM

## 2022-10-30 NOTE — H&P (Signed)
History and Physical      Candice Jordan QMV:[id number] DOB: 2001-05-31 DOA: 10/30/2022  PCP: Patient, No Pcp Per (will further assess) Patient coming from: home   I have personally briefly reviewed patient's old medical records in [place]  Chief Complaint: Left lower extremity redness  HPI: Candice Jordan is a 21 y.o. female with medical history significant for IV drug abuse, chronic anemia with baseline hemoglobin  8-11, in addition to left tibial plateau fracture status post ORIF with IM rod fixation to the mid to distal left tibial shaft, who is admitted to [facility] on 10/30/2022 with left lower extremity cellulitis after presenting from home to [facility] ED complaining of left lower extremity redness.  The patient reports to 3 days.  Left lower extremity erythema the level left ankle, with extension erythema approximately the level of the mid left shin as well as some distal extension of erythema of the dorsal surface of the proximal left foot.  This is associated with swelling, increased warmth, tenderness, as well as intermittent drainage, at times purulent in nature.  Denies rash or any other site.  Denies any recent preceding trauma to the left lower extremity.  No associated any acute focal numbness or paresthesias.  He denies any associated left extremity weakness.  No subjective fever, chills, rigors, or generalized myalgias.  No recent chest pain, shortness of breath, palpitations, diaphoresis, cough, abdominal n/v/d, dysuria or gross hematuria.  Denies any recent orthopnea, PND.  No hemoptysis.  She has a history of left tibial plateau fracture in May 2015, for which she underwent ORIF with IM rod fixation to the mid to distal left tibial shaft performed by Dr. Edmonia Lynch at [facility] at that time.  She denies any ensuing trauma to the left lower extremity.   She has a history of former IV drug abuse, noting that she previously used IV heroin, but states that her  last IV drug use occurred 6 months ago.  Additionally, she reports that she has never used IV drugs in the lower extremities.  Denies any known history of underlying diabetes.  No blood thinners at home.  She had presented to [facility] emergency department on 10/29/2022 with the above complaints.  She underwent CT of the left lower extremity, which reported showed subcutaneous swelling over the mid to distal tibia, extending over the left ankle, felt to be consistent with cellulitis, as well as a superficial abscess for which she underwent I&D by ED physician at [facility].  She was started on IV vancomycin and Zosyn, and was subsequently excepted for transfer to [facility] for further evaluation management of left lower extremity cellulitis, placed on wait list as not beds were currently available. She subsequently left [facility] emergency department AMA, and subsequently presented to [place] emergency department today for further evaluation management of the above.  Per chart review, most recent prior liver enzymes were checked in June 2021, and were notable for the following at that time: AST 138, ALT 162.     [facility] ED Course:  Vital signs in the ED were notable for the following: Afebrile; heart rate 28-4 06; systolic pressures in the 120s to 130s; respiratory rate 16-18, oxygen saturation 97 to 100% on room air.  Labs were notable for the following: CMP notable for the following: Potassium 3.4, bicarbonate 25, creatinine 0.71.  AST 102, ALT 79, alkaline phosphatase/total bilirubin within normal limits.  CBC notable for white cell count 8900, hemoglobin 10.56 with normocytic/normochromic  findings as well as nonelevated RDW, compared to 10.3 in June 2021.  Lactic acid 0.7.  CRP 6.7, ESR 75.  Blood cultures x2 were collected prior to initiation of IV antibiotics.  Imaging and additional notable ED work-up: Plain films of the left ankle showed generalized edema in the mid to lower  foreleg extending over the ankle and hindfoot, without any evidence of acute fracture or acute osteomyelitis.  These plain films films also showed healed mid to distal left tibial shaft fracture status post IM rod fixation, without any evidence of hardware loosening.  While in the ED, the following were administered: Rocephin, IV vancomycin.  Subsequently, the patient was admitted for further evaluation and management of presenting left lower extremity cellulitis, with plan for further evaluation for underlying osteomyelitis.      Review of Systems: As per HPI otherwise 10 point review of systems negative.   Past Medical History:  Diagnosis Date   Closed fracture of left tibial plateau    s/p ORIF in May '15 (Dr. Edmonia Lynch)   IVDU (intravenous drug user)     Past Surgical History:  Procedure Laterality Date   ORIF left tibial plateau fracture - May 2015 Left     Social History:  reports that she has been smoking cigarettes. She does not have any smokeless tobacco history on file. She reports that she does not currently use alcohol. She reports that she does not currently use drugs.   No Known Allergies  Family History  Problem Relation Age of Onset   Hepatitis C Sister     Family history reviewed and not pertinent.    Home medications: The patient reports that she is not currently on any prescription or over-the-counter medications/supplements as an outpatient.   Objective    Physical Exam: Vitals:   10/30/22 2028 10/30/22 2100 10/30/22 2146 10/30/22 2150  BP: 128/74     Pulse: 80     Resp: 18     Temp: 98.8 F (37.1 C)  98.6 F (37 C) 98.6 F (37 C)  TempSrc:   Oral Oral  SpO2: 98%     Weight:  54.4 kg    Height:  5' 5" (1.651 m)      General: appears to be stated age; alert, oriented Skin: warm;  erythema along the medial aspect of the left lower extremity at the level of the left medial ankle with some distal extension over the dorsal surface of the  proximal left foot as well as proximal extension to the left mid shin, associated with increased swelling, increased warmth, tenderness, as well as some serous drainage noted. Head:  AT/[place] Mouth:  Oral mucosa membranes appear moist, normal dentition Neck: supple; trachea midline Heart:  RRR; did not appreciate any M/R/G Lungs: CTAB, did not appreciate any wheezes, rales, or rhonchi Abdomen: + BS; soft, ND, NT Vascular: 2+ pedal pulses b/l; 2+ radial pulses b/l Extremities: no muscle wasting; left lower extremity erythema, swelling, tenderness, increased warmth, as further detailed above Neuro: strength and sensation intact in upper and lower extremities b/l    Labs on Admission: I have personally reviewed following labs and imaging studies  CBC: Recent Labs  Lab 10/30/22 0602  WBC 8.9  NEUTROABS 4.2  HGB 10.5*  HCT 33.2*  MCV 91.5  PLT 235   Basic Metabolic Panel: Recent Labs  Lab 10/30/22 0602  NA 134*  K 3.4*  CL 100  CO2 25  GLUCOSE 102*  BUN 10  CREATININE 0.71  CALCIUM 8.9   GFR: Estimated Creatinine Clearance: 94.7 mL/min (by C-G formula based on SCr of 0.71 mg/dL). Liver Function Tests: Recent Labs  Lab 10/30/22 0602  AST 102*  ALT 79*  ALKPHOS 70  BILITOT 0.3  PROT 8.1  ALBUMIN 3.1*   No results for input(s): "LIPASE", "AMYLASE" in the last 168 hours. No results for input(s): "AMMONIA" in the last 168 hours. Coagulation Profile: No results for input(s): "INR", "PROTIME" in the last 168 hours. Cardiac Enzymes: No results for input(s): "CKTOTAL", "CKMB", "CKMBINDEX", "TROPONINI" in the last 168 hours. BNP (last 3 results) No results for input(s): "PROBNP" in the last 8760 hours. HbA1C: No results for input(s): "HGBA1C" in the last 72 hours. CBG: No results for input(s): "GLUCAP" in the last 168 hours. Lipid Profile: No results for input(s): "CHOL", "HDL", "LDLCALC", "TRIG", "CHOLHDL", "LDLDIRECT" in the last 72 hours. Thyroid Function Tests: No  results for input(s): "TSH", "T4TOTAL", "FREET4", "T3FREE", "THYROIDAB" in the last 72 hours. Anemia Panel: No results for input(s): "VITAMINB12", "FOLATE", "FERRITIN", "TIBC", "IRON", "RETICCTPCT" in the last 72 hours. Urine analysis: No results found for: "COLORURINE", "APPEARANCEUR", "LABSPEC", "PHURINE", "GLUCOSEU", "HGBUR", "BILIRUBINUR", "KETONESUR", "PROTEINUR", "UROBILINOGEN", "NITRITE", "LEUKOCYTESUR"  Radiological Exams on Admission: DG Ankle Complete Left  Result Date: 10/30/2022 CLINICAL DATA:  Ankle wound infection. EXAM: LEFT ANKLE COMPLETE - 3+ VIEW COMPARISON:  Study of 07/18/2021 FINDINGS: Again noted is a healed mid to distal tibial shaft fracture with intramedullary rod fixation. No recent fracture is seen. No findings of significant hardware loosening. There is no erosive bone lesion suspicious for acute osteomyelitis. The ankle mortise is symmetric. Arthritic changes are not seen. There is generalized edema in the mid to lower foreleg extending over the ankle and hindfoot, similar to the previous study. No radiopaque soft tissue foreign body or soft tissue gas are seen. IMPRESSION: 1. Generalized edema in the mid to lower foreleg extending over the ankle and hindfoot, similar to the previous study. No radiographic evidence of acute osteomyelitis. 2. Healed mid to distal tibial shaft fracture with intramedullary rod fixation. Electronically Signed   By: Telford Nab M.D.   On: 10/30/2022 06:39      Assessment/Plan   Principal Problem:   Cellulitis of left lower extremity Active Problems:   Hypokalemia   Transaminitis   Chronic anemia      #) Cellulitis of the left lower extremity: 2 to 3 days of progressive left lower extremity erythema, swelling, tenderness, increased warmth over the medial left ankle with some proximal/distal extension of these features associated with patient's report of intermittent purulent drainage, with plain films of the left ankle showing  subcutaneous swelling consistent with cellulitis in the absence of evidence of acute fracture or acute osteomyelitis.  Her history of left tibial plateau fracture status post ORIF with IM rod fixation to the mid to distal left tibial shaft in May 2015 by Dr. Edmonia Lynch is noted, with plain films of the left lower extremity showing no evidence of hardware loosening at this time.  No evidence of crepitus on exam or subcutaneous air on imaging to suggest necrotizing fasciitis.  Left lower extremity appears neurovascular intact.  She has elevated ESR/CRP, as quantified above, and will pursue additional imaging of the left lower extremity to further evaluate for osteomyelitis/hardware involvement.  However, unable to pursue MRI of the left lower extremity in the context of associated presence of metal rod via 2015 ORIF. Will use impending imaging to determine need for ensuing ortho consult.   Current  SIRS criteria limited to mild tachycardia.  However, in the absence of leukocytosis or objective fever, criteria for sepsis not currently met.  In the setting of once respiratory, her cellulitis meets criteria to be considered moderate in severity.  Presenting lactic acid nonelevated.  In the setting of moderately severe cellulitis with report of purulent drainage, will continue the IV vancomycin and Rocephin that was started in May subsequent emergency department today after collection of blood cultures x2.  If evidence of hardware infection, may consider expanding IV antibiotic coverage to include Pseudomonas coverage.    Plan: Follow-up results of blood cultures x2.  Repeat CRP/ESR in the morning.  IV vancomycin and Rocephin, as above.  CT of the left ankle, as above.  Check INR.  Check EKG.  Prn IV fentanyl, Zofran.  I have placed RN communication order requesting elevation of the left lower extremity to assist with associated swelling.           #) Hypokalemia: Presenting serum potassium level  3.4.  Plan: Potassium chloride 40 mill colons p.o. x1 dose now.  Add on serum magnesium level.  Repeat CMP in the morning.              #) Chronic transaminitis: Per chart review, it appears that the patient has chronically elevated AST/ALT dating back to April 2021 with mildly elevated AST/ALT and slight AST predominance noted at the time of today's presentation, with these values slightly lower than most recent prior values in June 2021.  No corresponding evidence of elevation in alk phos/total bilirubin to suggest cholestatic pattern.  Patient denies any acute abdominal discomfort.  Unclear etiology, although history of recreational drug use, including IV drug abuse is noted.  Given these liver enzyme findings appear chronic, and are slightly lower than prior, will refrain from aggressive diagnostic evaluation thereof at this time.   Plan: Check INR.  Repeat CMP in the morning.             #) Chronic normocytic anemia: Documented history of such, with chart review revealing baseline hemoglobin range 8-11, with presenting hemoglobin consistent with his baseline range, in the absence of any evidence of active blood loss.  Specific etiology of the patient's chronic anemia, including distinction between iron deficiency versus anemia of chronic disease.  We will check iron studies to further assess.  Additionally, given potential for ensuing surgical intervention in the setting of her presenting left lower extremity cellulitis, pending further evaluation for associated osteomyelitis, will also check type and screen.   Plan: Add on iron studies.  Check INR.  Repeat CBC in the morning.  Type and screen.      DVT prophylaxis: SCD to rle  Code Status: Full code Family Communication: none Disposition Plan: Per Rounding Team Consults called: none;  Admission status: inpatient     I SPENT GREATER THAN 75  MINUTES IN CLINICAL CARE TIME/MEDICAL DECISION-MAKING IN COMPLETING THIS  ADMISSION.      [place] DO [facility]  From [place]   10/30/2022, 9:53 PM

## 2022-10-30 NOTE — ED Triage Notes (Signed)
Patient reports worsening left inner ankle wound infection with swelling and drainage , denies fever or chills .

## 2022-10-30 NOTE — ED Notes (Signed)
Pt has been called multiple times for sort and no response

## 2022-10-30 NOTE — ED Provider Triage Note (Signed)
Emergency Medicine Provider Triage Evaluation Note  Candice Jordan , a 21 y.o. female  was evaluated in triage.  Pt complains of wound to the left ankle.  Was seen at [facility] yesterday and was informed that she has a "bone infection".  States that she had 1 dose of IV antibiotics and they recommended admission to the hospital here at [facility] but she left prior to transport due to "needing to make sure all my things were squared away at home".  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  States that she has been clean x 6 months.  She denies wound or injury to the medial ankle prior to onset of infection.  States that the providers at [facility] drained the wound yesterday.  Review of Systems  Positive: Wound over left ankle x 1 week, chills, fevers. Negative: Chest pain shortness of breath syncope  Physical Exam  BP 137/80 (BP Location: Right Arm)   Pulse (!) 106   Temp 97.7 F (36.5 C)   Resp 18   SpO2 99%  Gen:   Awake, no distress   Resp:  Normal effort  MSK:   Moves extremities without difficulty  Other:  Erythematous wound is draining serosanguineous fluid and scant pus from the left medial ankle with surrounding soft tissue edema, erythema and tenderness palpation.  Regular rate and rhythm at time of my evaluation with systolic murmur.  Patient states she has had a murmur for a couple of years since she had endocarditis.  Medical Decision Making  Medically screening exam initiated at 5:43 AM.  Appropriate orders placed.  Carrieanne Debell was informed that the remainder of the evaluation will be completed by another provider, this initial triage assessment does not replace that evaluation, and the importance of remaining in the ED until their evaluation is complete.  Workup initiated.  This chart was dictated using voice recognition software, Dragon. Despite the best efforts of this provider to proofread and correct errors, errors may  still occur which can change documentation meaning.    Paris Lore, PA-C 10/30/22 0559

## 2022-10-31 ENCOUNTER — Inpatient Hospital Stay (HOSPITAL_COMMUNITY): Payer: Medicaid Other

## 2022-10-31 DIAGNOSIS — T847XXA Infection and inflammatory reaction due to other internal orthopedic prosthetic devices, implants and grafts, initial encounter: Secondary | ICD-10-CM

## 2022-10-31 DIAGNOSIS — M869 Osteomyelitis, unspecified: Secondary | ICD-10-CM

## 2022-10-31 DIAGNOSIS — L03116 Cellulitis of left lower limb: Secondary | ICD-10-CM

## 2022-10-31 LAB — CBC WITH DIFFERENTIAL/PLATELET
Abs Immature Granulocytes: 0.02 10*3/uL (ref 0.00–0.07)
Basophils Absolute: 0 10*3/uL (ref 0.0–0.1)
Basophils Relative: 1 %
Eosinophils Absolute: 0.1 10*3/uL (ref 0.0–0.5)
Eosinophils Relative: 1 %
HCT: 32 % — ABNORMAL LOW (ref 36.0–46.0)
Hemoglobin: 10.6 g/dL — ABNORMAL LOW (ref 12.0–15.0)
Immature Granulocytes: 0 %
Lymphocytes Relative: 51 %
Lymphs Abs: 3.2 10*3/uL (ref 0.7–4.0)
MCH: 29.5 pg (ref 26.0–34.0)
MCHC: 33.1 g/dL (ref 30.0–36.0)
MCV: 89.1 fL (ref 80.0–100.0)
Monocytes Absolute: 0.5 10*3/uL (ref 0.1–1.0)
Monocytes Relative: 8 %
Neutro Abs: 2.5 10*3/uL (ref 1.7–7.7)
Neutrophils Relative %: 39 %
Platelets: 299 10*3/uL (ref 150–400)
RBC: 3.59 MIL/uL — ABNORMAL LOW (ref 3.87–5.11)
RDW: 13.1 % (ref 11.5–15.5)
WBC: 6.4 10*3/uL (ref 4.0–10.5)
nRBC: 0 % (ref 0.0–0.2)

## 2022-10-31 LAB — HCG, QUANTITATIVE, PREGNANCY: hCG, Beta Chain, Quant, S: <1 m[IU]/mL (ref <5)

## 2022-10-31 LAB — COMPREHENSIVE METABOLIC PANEL
ALT: 79 U/L — ABNORMAL HIGH (ref 0–44)
AST: 90 U/L — ABNORMAL HIGH (ref 15–41)
Albumin: 2.8 g/dL — ABNORMAL LOW (ref 3.5–5.0)
Alkaline Phosphatase: 60 U/L (ref 38–126)
Anion gap: 5 (ref 5–15)
BUN: 7 mg/dL (ref 6–20)
CO2: 25 mmol/L (ref 22–32)
Calcium: 8.8 mg/dL — ABNORMAL LOW (ref 8.9–10.3)
Chloride: 104 mmol/L (ref 98–111)
Creatinine, Ser: 0.64 mg/dL (ref 0.44–1.00)
GFR, Estimated: >60 mL/min (ref >60)
Glucose, Bld: 122 mg/dL — ABNORMAL HIGH (ref 70–99)
Potassium: 3.8 mmol/L (ref 3.5–5.1)
Sodium: 134 mmol/L — ABNORMAL LOW (ref 135–145)
Total Bilirubin: 0.5 mg/dL (ref 0.3–1.2)
Total Protein: 7.6 g/dL (ref 6.5–8.1)

## 2022-10-31 LAB — MAGNESIUM: Magnesium: 1.7 mg/dL (ref 1.7–2.4)

## 2022-10-31 LAB — C-REACTIVE PROTEIN: CRP: 3.2 mg/dL — ABNORMAL HIGH (ref <1.0)

## 2022-10-31 LAB — LACTIC ACID, PLASMA: Lactic Acid, Venous: 0.9 mmol/L (ref 0.5–1.9)

## 2022-10-31 LAB — RAPID URINE DRUG SCREEN, HOSP PERFORMED
Amphetamines: POSITIVE — AB
Barbiturates: NOT DETECTED
Benzodiazepines: NOT DETECTED
Cocaine: NOT DETECTED
Opiates: NOT DETECTED
Tetrahydrocannabinol: POSITIVE — AB

## 2022-10-31 LAB — SEDIMENTATION RATE: Sed Rate: 82 mm/hr — ABNORMAL HIGH (ref 0–22)

## 2022-10-31 LAB — PROTIME-INR
INR: 1.1 (ref 0.8–1.2)
Prothrombin Time: 14.2 seconds (ref 11.4–15.2)

## 2022-10-31 MED ORDER — INFLUENZA VAC SPLIT QUAD 0.5 ML IM SUSY: 0.5000 mL | PREFILLED_SYRINGE | INTRAMUSCULAR | Status: DC

## 2022-10-31 MED ORDER — IOHEXOL 350 MG/ML SOLN
80.0000 mL | Freq: Once | INTRAVENOUS | Status: AC | PRN
  Administered 2022-10-31: 80 mL via INTRAVENOUS

## 2022-10-31 NOTE — Consult Note (Addendum)
Reason for Consult:Left ankle abscess Referring Physician: Marlin Canary Time called: [id number] Time at bedside: 1048   Candice Jordan is an 21 y.o. female.  HPI: Candice Jordan was admitted with LLE cellulitis. She says it started about a week ago and has been getting worse with pain and redness. She also had a small wound open up and drain pus. She went to the ED and underwent I&D. This is the 5th or 6th time she has developed cellulitis in this exact place this year but it's never been this bad or drained. It has responded to abx treatment prior to this.   Past Medical History:  Diagnosis Date   Closed fracture of left tibial plateau    s/p ORIF in May '15 (Dr. Margarita Rana)   IVDU (intravenous drug user)     Past Surgical History:  Procedure Laterality Date   ORIF left tibial plateau fracture - May 2015 Left     Family History  Problem Relation Age of Onset   Hepatitis C Sister     Social History:  reports that she has been smoking cigarettes. She does not have any smokeless tobacco history on file. She reports that she does not currently use alcohol. She reports that she does not currently use drugs.  Allergies: No Known Allergies  Medications: I have reviewed the patient's current medications.  Results for orders placed or performed during the hospital encounter of 10/30/22 (from the past 48 hour(s))  Lactic acid, plasma     Status: None   Collection Time: 10/30/22  6:02 AM  Result Value Ref Range   Lactic Acid, Venous 0.7 0.5 - 1.9 mmol/L    Comment: Performed at [facility], [address], [place], [place] [address]  Comprehensive metabolic panel     Status: Abnormal   Collection Time: 10/30/22  6:02 AM  Result Value Ref Range   Sodium 134 (L) 135 - 145 mmol/L   Potassium 3.4 (L) 3.5 - 5.1 mmol/L   Chloride 100 98 - 111 mmol/L   CO2 25 22 - 32 mmol/L   Glucose, Bld 102 (H) 70 - 99 mg/dL    Comment: Glucose reference range applies only to samples taken after fasting  for at least 8 hours.   BUN 10 6 - 20 mg/dL   Creatinine, Ser 7.82 0.44 - 1.00 mg/dL   Calcium 8.9 8.9 - 95.6 mg/dL   Total Protein 8.1 6.5 - 8.1 g/dL   Albumin 3.1 (L) 3.5 - 5.0 g/dL   AST 213 (H) 15 - 41 U/L   ALT 79 (H) 0 - 44 U/L   Alkaline Phosphatase 70 38 - 126 U/L   Total Bilirubin 0.3 0.3 - 1.2 mg/dL   GFR, Estimated >08 >65 mL/min    Comment: (NOTE) Calculated using the CKD-EPI Creatinine Equation (2021)    Anion gap 9 5 - 15    Comment: Performed at [facility], [address], [place], [place] [address]  CBC with Differential     Status: Abnormal   Collection Time: 10/30/22  6:02 AM  Result Value Ref Range   WBC 8.9 4.0 - 10.5 K/uL   RBC 3.63 (L) 3.87 - 5.11 MIL/uL   Hemoglobin 10.5 (L) 12.0 - 15.0 g/dL   HCT 62.9 (L) 52.8 - 41.3 %   MCV 91.5 80.0 - 100.0 fL   MCH 28.9 26.0 - 34.0 pg   MCHC 31.6 30.0 - 36.0 g/dL   RDW 24.4 01.0 - 27.2 %  Platelets 334 150 - 400 K/uL   nRBC 0.0 0.0 - 0.2 %   Neutrophils Relative % 47 %   Neutro Abs 4.2 1.7 - 7.7 K/uL   Lymphocytes Relative 41 %   Lymphs Abs 3.7 0.7 - 4.0 K/uL   Monocytes Relative 9 %   Monocytes Absolute 0.8 0.1 - 1.0 K/uL   Eosinophils Relative 2 %   Eosinophils Absolute 0.2 0.0 - 0.5 K/uL   Basophils Relative 1 %   Basophils Absolute 0.1 0.0 - 0.1 K/uL   Immature Granulocytes 0 %   Abs Immature Granulocytes 0.02 0.00 - 0.07 K/uL    Comment: Performed at [facility], [address] [place], [place] [address]  Blood Culture (routine x 2)     Status: None (Preliminary result)   Collection Time: 10/30/22  6:02 AM   Specimen: BLOOD  Result Value Ref Range   Specimen Description BLOOD LEFT ANTECUBITAL    Special Requests      BOTTLES DRAWN AEROBIC AND ANAEROBIC Blood Culture adequate volume   Culture      NO GROWTH 1 DAY Performed at [facility], [address] [place], [place] [address]    Report Status PENDING   C-reactive protein     Status: Abnormal   Collection Time: 10/30/22   6:02 AM  Result Value Ref Range   CRP 6.7 (H) <1.0 mg/dL    Comment: Performed at [facility], [address], [place], [place] [address]  Sedimentation rate     Status: Abnormal   Collection Time: 10/30/22  6:02 AM  Result Value Ref Range   Sed Rate 75 (H) 0 - 22 mm/hr    Comment: Performed at [facility], [address], [place], [place] [address]  Magnesium     Status: None   Collection Time: 10/30/22  6:02 AM  Result Value Ref Range   Magnesium 1.9 1.7 - 2.4 mg/dL    Comment: Performed at [facility], [address], [place], [place] [address]  Iron and TIBC     Status: Abnormal   Collection Time: 10/30/22  6:02 AM  Result Value Ref Range   Iron 25 (L) 28 - 170 ug/dL   TIBC 505 397 - 673 ug/dL   Saturation Ratios 7 (L) 10.4 - 31.8 %   UIBC 322 ug/dL    Comment: Performed at [facility], [address], [place], [place] [address]  Ferritin     Status: None   Collection Time: 10/30/22  6:02 AM  Result Value Ref Range   Ferritin 79 11 - 307 ng/mL    Comment: Performed at [facility], [address], [place], [place] [address]  Blood Culture (routine x 2)     Status: None (Preliminary result)   Collection Time: 10/30/22  6:06 AM   Specimen: BLOOD  Result Value Ref Range   Specimen Description BLOOD RIGHT ANTECUBITAL    Special Requests      BOTTLES DRAWN AEROBIC ONLY Blood Culture results may not be optimal due to an inadequate volume of blood received in culture bottles   Culture      NO GROWTH 1 DAY Performed at [facility], [address] [place], [place] [address]    Report Status PENDING   Type and screen [facility]     Status: None   Collection Time: 10/30/22 10:05 PM  Result Value Ref Range  ABO/RH(D) O POS    Antibody Screen NEG    Sample Expiration      11/02/2022,2359 Performed at [facility], [address] [place], [place] [address]   ABO/Rh     Status: None   Collection Time: 10/30/22  10:15 PM  Result Value Ref Range   ABO/RH(D)      O POS Performed at [facility], [address] [place], [place] [address]   Lactic acid, plasma     Status: None   Collection Time: 10/31/22  4:37 AM  Result Value Ref Range   Lactic Acid, Venous 0.9 0.5 - 1.9 mmol/L    Comment: Performed at [facility], [address] [place], [place] [address]  hCG, quantitative, pregnancy     Status: None   Collection Time: 10/31/22  4:37 AM  Result Value Ref Range   hCG, Beta Chain, Quant, S <1 <5 mIU/mL    Comment:          GEST. AGE      CONC.  (mIU/mL)   <=1 WEEK        5 - 50     2 WEEKS       50 - 500     3 WEEKS       100 - 10,000     4 WEEKS     1,000 - 30,000     5 WEEKS     3,500 - 115,000   6-8 WEEKS     12,000 - 270,000    12 WEEKS     15,000 - 220,000        FEMALE AND NON-PREGNANT FEMALE:     LESS THAN 5 mIU/mL Performed at [facility], [address] [place], [place] [address]   CBC with Differential/Platelet     Status: Abnormal   Collection Time: 10/31/22  5:08 AM  Result Value Ref Range   WBC 6.4 4.0 - 10.5 K/uL   RBC 3.59 (L) 3.87 - 5.11 MIL/uL   Hemoglobin 10.6 (L) 12.0 - 15.0 g/dL   HCT 29.532.0 (L) 62.136.0 - 30.846.0 %   MCV 89.1 80.0 - 100.0 fL   MCH 29.5 26.0 - 34.0 pg   MCHC 33.1 30.0 - 36.0 g/dL   RDW 65.713.1 84.611.5 - 96.215.5 %   Platelets 299 150 - 400 K/uL   nRBC 0.0 0.0 - 0.2 %   Neutrophils Relative % 39 %   Neutro Abs 2.5 1.7 - 7.7 K/uL   Lymphocytes Relative 51 %   Lymphs Abs 3.2 0.7 - 4.0 K/uL   Monocytes Relative 8 %   Monocytes Absolute 0.5 0.1 - 1.0 K/uL   Eosinophils Relative 1 %   Eosinophils Absolute 0.1 0.0 - 0.5 K/uL   Basophils Relative 1 %   Basophils Absolute 0.0 0.0 - 0.1 K/uL   Immature Granulocytes 0 %   Abs Immature Granulocytes 0.02 0.00 - 0.07 K/uL    Comment: Performed at [facility], [address] [place], [place] [address]  Comprehensive metabolic panel     Status: Abnormal   Collection Time: 10/31/22  5:08 AM   Result Value Ref Range   Sodium 134 (L) 135 - 145 mmol/L   Potassium 3.8 3.5 - 5.1 mmol/L   Chloride 104 98 - 111 mmol/L   CO2 25 22 - 32 mmol/L   Glucose, Bld 122 (H) 70 - 99 mg/dL    Comment: Glucose reference range  applies only to samples taken after fasting for at least 8 hours.   BUN 7 6 - 20 mg/dL   Creatinine, Ser 1.61 0.44 - 1.00 mg/dL   Calcium 8.8 (L) 8.9 - 10.3 mg/dL   Total Protein 7.6 6.5 - 8.1 g/dL   Albumin 2.8 (L) 3.5 - 5.0 g/dL   AST 90 (H) 15 - 41 U/L   ALT 79 (H) 0 - 44 U/L   Alkaline Phosphatase 60 38 - 126 U/L   Total Bilirubin 0.5 0.3 - 1.2 mg/dL   GFR, Estimated >09 >60 mL/min    Comment: (NOTE) Calculated using the CKD-EPI Creatinine Equation (2021)    Anion gap 5 5 - 15    Comment: Performed at [facility], [address] [place], [place] [address]  Magnesium     Status: None   Collection Time: 10/31/22  5:08 AM  Result Value Ref Range   Magnesium 1.7 1.7 - 2.4 mg/dL    Comment: Performed at [facility], [address], [place], [place] [address]  C-reactive protein     Status: Abnormal   Collection Time: 10/31/22  5:08 AM  Result Value Ref Range   CRP 3.2 (H) <1.0 mg/dL    Comment: Performed at [facility], [address], [place], [place] [address]  Sedimentation rate     Status: Abnormal   Collection Time: 10/31/22  5:08 AM  Result Value Ref Range   Sed Rate 82 (H) 0 - 22 mm/hr    Comment: Performed at [facility], [address], [place], [place] [address]  Protime-INR     Status: None   Collection Time: 10/31/22  5:08 AM  Result Value Ref Range   Prothrombin Time 14.2 11.4 - 15.2 seconds   INR 1.1 0.8 - 1.2    Comment: (NOTE) INR goal varies based on device and disease states. Performed at [facility], [address], [place], [place] [address]     CT ANKLE LEFT W CONTRAST  Result Date: 10/31/2022 CLINICAL DATA:  Left lower extremity osteomyelitis. History of IM rod fixation. EXAM: CT OF THE LEFT  ANKLE WITH CONTRAST TECHNIQUE: Multidetector CT imaging of the left ankle was performed following the standard protocol during bolus administration of intravenous contrast. RADIATION DOSE REDUCTION: This exam was performed according to the departmental dose-optimization program which includes automated exposure control, adjustment of the mA and/or kV according to patient size and/or use of iterative reconstruction technique. CONTRAST:  80mL OMNIPAQUE IOHEXOL 350 MG/ML SOLN COMPARISON:  10/29/2022 FINDINGS: Bones/Joint/Cartilage No acute fracture or dislocation. Healed mid-distal left tibial diaphyseal fracture transfixed with an intramedullary nail and interlocking screws. Lucency around the distal visualized portion of the intramedullary nail most focally severe at the tip of the intramedullary nail. Well-marginated cortical defect along the posteromedial aspect of the distal tibial metaphysis which is contiguous with the most pronounced area of perihardware lucency. No joint effusion. Ligaments Ligaments are suboptimally evaluated by CT. Muscles and Tendons Flexor, extensor, peroneal and Achilles tendons are intact. Plantar fascia is intact. Visualized portions of the muscles of the left lower leg demonstrate no focal abnormality. Soft tissue 3.7 x 1.4 x 5.1 cm complex fluid collection along the posteromedial left lower leg superficial to the fascia with a sinus track extending from the posteromedial tibial cortical defect. No soft tissue mass. IMPRESSION: 1. Healed mid-distal left tibial diaphyseal fracture transfixed with an intramedullary nail and interlocking screws. Lucency around the distal  visualized portion of the intramedullary nail most focally severe at the tip of the intramedullary nail consistent with osteomyelitis. Well-marginated cortical defect along the posteromedial aspect of the distal tibial metaphysis which is contiguous with the most pronounced area of perihardware lucency. A 3.7 x 1.4 x 5.1  cm abscess along the posteromedial left lower leg superficial to the fascia with a sinus track extending from the posteromedial tibial cortical defect. Overall appearance is similar to the prior exam of 10/29/2022. Electronically Signed   By: Elige Ko M.D.   On: 10/31/2022 08:54   DG Ankle Complete Left  Result Date: 10/30/2022 CLINICAL DATA:  Ankle wound infection. EXAM: LEFT ANKLE COMPLETE - 3+ VIEW COMPARISON:  Study of 07/18/2021 FINDINGS: Again noted is a healed mid to distal tibial shaft fracture with intramedullary rod fixation. No recent fracture is seen. No findings of significant hardware loosening. There is no erosive bone lesion suspicious for acute osteomyelitis. The ankle mortise is symmetric. Arthritic changes are not seen. There is generalized edema in the mid to lower foreleg extending over the ankle and hindfoot, similar to the previous study. No radiopaque soft tissue foreign body or soft tissue gas are seen. IMPRESSION: 1. Generalized edema in the mid to lower foreleg extending over the ankle and hindfoot, similar to the previous study. No radiographic evidence of acute osteomyelitis. 2. Healed mid to distal tibial shaft fracture with intramedullary rod fixation. Electronically Signed   By: Almira Bar M.D.   On: 10/30/2022 06:39    Review of Systems  Constitutional:  Positive for chills. Negative for fatigue and fever.  HENT:  Negative for ear discharge, ear pain, hearing loss and tinnitus.   Eyes:  Negative for photophobia and pain.  Respiratory:  Negative for cough and shortness of breath.   Cardiovascular:  Negative for chest pain.  Gastrointestinal:  Negative for abdominal pain, nausea and vomiting.  Genitourinary:  Negative for dysuria, flank pain, frequency and urgency.  Musculoskeletal:  Positive for arthralgias (Left ankle). Negative for back pain, myalgias and neck pain.  Neurological:  Negative for dizziness and headaches.  Hematological:  Does not bruise/bleed  easily.  Psychiatric/Behavioral:  The patient is not nervous/anxious.    Blood pressure (!) 122/92, pulse (!) 107, temperature 98.4 F (36.9 C), temperature source Oral, resp. rate 20, height 5\' 5"  (1.651 m), weight 54.4 kg, SpO2 100 %. Physical Exam Constitutional:      General: She is not in acute distress.    Appearance: She is well-developed. She is not diaphoretic.  HENT:     Head: Normocephalic and atraumatic.  Eyes:     General: No scleral icterus.       Right eye: No discharge.        Left eye: No discharge.     Conjunctiva/sclera: Conjunctivae normal.  Cardiovascular:     Rate and Rhythm: Normal rate and regular rhythm.  Pulmonary:     Effort: Pulmonary effort is normal. No respiratory distress.  Musculoskeletal:     Cervical back: Normal range of motion.     Comments: LLE No traumatic wounds or ecchymosis, medial ankle erythematous with small wound with purulent discharge. Mild diffuse TTP where erythematous.  No knee or ankle effusion  Knee stable to varus/ valgus and anterior/posterior stress  Sens DPN, SPN, TN intact  Motor EHL, ext, flex, evers 5/5  DP 2+, PT 2+, No significant edema  Skin:    General: Skin is warm and dry.  Neurological:     Mental  Status: She is alert.  Psychiatric:        Mood and Affect: Mood normal.        Behavior: Behavior normal.    Assessment/Plan: Left distal tibia osteo -- Plan on I&D and hardware removal with Dr. Eulah Pont, timing TBD. Will get ID consult.    Freeman Caldron, PA-C Orthopedic Surgery [phone] 10/31/2022, 11:10 AM

## 2022-10-31 NOTE — ED Notes (Signed)
ED TO INPATIENT HANDOFF REPORT  ED Nurse Name and Phone #: Corrie Dandy, RN  S Name/Age/Gender Candice Jordan 21 y.o. female Room/Bed: 010C/010C  Code Status   Code Status: Full Code  Home/SNF/Other Home Patient oriented to: self, place, time, and situation Is this baseline? Yes   Triage Complete: Triage complete  Chief Complaint Cellulitis of left lower extremity [L03.116]  Triage Note Patient reports worsening left inner ankle wound infection with swelling and drainage , denies fever or chills .    Allergies No Known Allergies  Level of Care/Admitting Diagnosis ED Disposition     ED Disposition  Admit   Condition  --   Comment  Hospital Area: [facility] [[id number]]  Level of Care: Telemetry Medical [104]  May admit patient to [facility] or [facility] if equivalent level of care is available:: No  Covid Evaluation: Asymptomatic - no recent exposure (last 10 days) testing not required  Diagnosis: Cellulitis of left lower extremity [237628]  Admitting Physician: Angie Fava [[id number]]  Attending Physician: Angie Fava [[id number]]  Certification:: I certify this patient will need inpatient services for at least 2 midnights  Estimated Length of Stay: 2          B Medical/Surgery History Past Medical History:  Diagnosis Date   Closed fracture of left tibial plateau    s/p ORIF in May '15 (Dr. Margarita Rana)   IVDU (intravenous drug user)    Past Surgical History:  Procedure Laterality Date   ORIF left tibial plateau fracture - May 2015 Left      A IV Location/Drains/Wounds Patient Lines/Drains/Airways Status     Active Line/Drains/Airways     Name Placement date Placement time Site Days   Peripheral IV 10/30/22 20 G Anterior;Right Forearm 10/30/22  2150  Forearm  1            Intake/Output Last 24 hours No intake or output data in the 24 hours ending 10/31/22 0029  Labs/Imaging Results for orders placed or performed  during the hospital encounter of 10/30/22 (from the past 48 hour(s))  Lactic acid, plasma     Status: None   Collection Time: 10/30/22  6:02 AM  Result Value Ref Range   Lactic Acid, Venous 0.7 0.5 - 1.9 mmol/L    Comment: Performed at [facility], [address], [place], [place] [address]  Comprehensive metabolic panel     Status: Abnormal   Collection Time: 10/30/22  6:02 AM  Result Value Ref Range   Sodium 134 (L) 135 - 145 mmol/L   Potassium 3.4 (L) 3.5 - 5.1 mmol/L   Chloride 100 98 - 111 mmol/L   CO2 25 22 - 32 mmol/L   Glucose, Bld 102 (H) 70 - 99 mg/dL    Comment: Glucose reference range applies only to samples taken after fasting for at least 8 hours.   BUN 10 6 - 20 mg/dL   Creatinine, Ser 8.54 0.44 - 1.00 mg/dL   Calcium 8.9 8.9 - 62.7 mg/dL   Total Protein 8.1 6.5 - 8.1 g/dL   Albumin 3.1 (L) 3.5 - 5.0 g/dL   AST 035 (H) 15 - 41 U/L   ALT 79 (H) 0 - 44 U/L   Alkaline Phosphatase 70 38 - 126 U/L   Total Bilirubin 0.3 0.3 - 1.2 mg/dL   GFR, Estimated >00 >93 mL/min    Comment: (NOTE) Calculated using the CKD-EPI Creatinine Equation (2021)    Anion gap 9 5 -  15    Comment: Performed at [facility], [address] [place], [place] [address]  CBC with Differential     Status: Abnormal   Collection Time: 10/30/22  6:02 AM  Result Value Ref Range   WBC 8.9 4.0 - 10.5 K/uL   RBC 3.63 (L) 3.87 - 5.11 MIL/uL   Hemoglobin 10.5 (L) 12.0 - 15.0 g/dL   HCT 16.3 (L) 84.5 - 36.4 %   MCV 91.5 80.0 - 100.0 fL   MCH 28.9 26.0 - 34.0 pg   MCHC 31.6 30.0 - 36.0 g/dL   RDW 68.0 32.1 - 22.4 %   Platelets 334 150 - 400 K/uL   nRBC 0.0 0.0 - 0.2 %   Neutrophils Relative % 47 %   Neutro Abs 4.2 1.7 - 7.7 K/uL   Lymphocytes Relative 41 %   Lymphs Abs 3.7 0.7 - 4.0 K/uL   Monocytes Relative 9 %   Monocytes Absolute 0.8 0.1 - 1.0 K/uL   Eosinophils Relative 2 %   Eosinophils Absolute 0.2 0.0 - 0.5 K/uL   Basophils Relative 1 %   Basophils Absolute 0.1 0.0 - 0.1 K/uL    Immature Granulocytes 0 %   Abs Immature Granulocytes 0.02 0.00 - 0.07 K/uL    Comment: Performed at [facility], [address], [place], [place] [address]  C-reactive protein     Status: Abnormal   Collection Time: 10/30/22  6:02 AM  Result Value Ref Range   CRP 6.7 (H) <1.0 mg/dL    Comment: Performed at [facility], [address] [place], [place] [address]  Sedimentation rate     Status: Abnormal   Collection Time: 10/30/22  6:02 AM  Result Value Ref Range   Sed Rate 75 (H) 0 - 22 mm/hr    Comment: Performed at [facility], [address], [place], [place] [address]  Magnesium     Status: None   Collection Time: 10/30/22  6:02 AM  Result Value Ref Range   Magnesium 1.9 1.7 - 2.4 mg/dL    Comment: Performed at [facility], [address], [place], [place] [address]  Iron and TIBC     Status: Abnormal   Collection Time: 10/30/22  6:02 AM  Result Value Ref Range   Iron 25 (L) 28 - 170 ug/dL   TIBC 888 280 - 034 ug/dL   Saturation Ratios 7 (L) 10.4 - 31.8 %   UIBC 322 ug/dL    Comment: Performed at [facility], [address], [place], [place] [address]  Ferritin     Status: None   Collection Time: 10/30/22  6:02 AM  Result Value Ref Range   Ferritin 79 11 - 307 ng/mL    Comment: Performed at [facility], [address] [place], [place] [address]  Type and screen [facility]     Status: None   Collection Time: 10/30/22 10:05 PM  Result Value Ref Range   ABO/RH(D) O POS    Antibody Screen NEG    Sample Expiration      11/02/2022,2359 Performed at [facility], [address], [place], [place] [address]   ABO/Rh     Status: None   Collection Time: 10/30/22 10:15 PM  Result Value Ref Range   ABO/RH(D)      O POS Performed at [facility], [address] [place], [place] [address]  DG Ankle Complete Left  Result Date: 10/30/2022 CLINICAL DATA:  Ankle wound infection. EXAM: LEFT ANKLE  COMPLETE - 3+ VIEW COMPARISON:  Study of 07/18/2021 FINDINGS: Again noted is a healed mid to distal tibial shaft fracture with intramedullary rod fixation. No recent fracture is seen. No findings of significant hardware loosening. There is no erosive bone lesion suspicious for acute osteomyelitis. The ankle mortise is symmetric. Arthritic changes are not seen. There is generalized edema in the mid to lower foreleg extending over the ankle and hindfoot, similar to the previous study. No radiopaque soft tissue foreign body or soft tissue gas are seen. IMPRESSION: 1. Generalized edema in the mid to lower foreleg extending over the ankle and hindfoot, similar to the previous study. No radiographic evidence of acute osteomyelitis. 2. Healed mid to distal tibial shaft fracture with intramedullary rod fixation. Electronically Signed   By: Telford Nab M.D.   On: 10/30/2022 06:39    Pending Labs Unresulted Labs (From admission, onward)     Start     Ordered   10/31/22 0500  CBC with Differential/Platelet  Tomorrow morning,   R        10/30/22 2127   10/31/22 0500  Comprehensive metabolic panel  Tomorrow morning,   R        10/30/22 2127   10/31/22 0500  Magnesium  Tomorrow morning,   R        10/30/22 2127   10/31/22 0500  C-reactive protein  Tomorrow morning,   R        10/30/22 2128   10/31/22 0500  Sedimentation rate  Tomorrow morning,   R        10/30/22 2128   10/31/22 0500  Protime-INR  Tomorrow morning,   R        10/30/22 2129   10/30/22 0648  hCG, quantitative, pregnancy  Once,   URGENT        10/30/22 0648   10/30/22 0555  Lactic acid, plasma  (Undifferentiated presentation (screening labs and basic nursing orders))  Now then every 2 hours,   R      10/30/22 0555   10/30/22 0555  Blood Culture (routine x 2)  (Undifferentiated presentation (screening labs and basic nursing orders))  BLOOD CULTURE X 2,   STAT      10/30/22 0555            Vitals/Pain Today's Vitals   10/30/22 2150  10/30/22 2230 10/30/22 2300 10/30/22 2351  BP:  (!) 128/97 (!) 125/90 (!) 141/95  Pulse:  100 94 96  Resp:  15 14 15   Temp: 98.6 F (37 C)     TempSrc: Oral     SpO2:  96% 100% 100%  Weight:      Height:      PainSc:        Isolation Precautions No active isolations  Medications Medications  vancomycin (VANCOREADY) IVPB 750 mg/150 mL (has no administration in time range)  cefTRIAXone (ROCEPHIN) 1 g in sodium chloride 0.9 % 100 mL IVPB (has no administration in time range)  acetaminophen (TYLENOL) tablet 650 mg (has no administration in time range)    Or  acetaminophen (TYLENOL) suppository 650 mg (has no administration in time range)  melatonin tablet 3 mg (has no administration in time range)  naloxone (NARCAN) injection 0.4 mg (has no administration in time range)  fentaNYL (SUBLIMAZE) injection 50 mcg (has no administration in time range)  ondansetron (ZOFRAN) injection 4 mg (has no  administration in time range)  lactated ringers infusion ( Intravenous New Bag/Given 10/30/22 2349)  cefTRIAXone (ROCEPHIN) 2 g in sodium chloride 0.9 % 100 mL IVPB (0 g Intravenous Stopped 10/30/22 2228)  vancomycin (VANCOCIN) IVPB 1000 mg/200 mL premix (0 mg Intravenous Stopped 10/30/22 2340)  potassium chloride SA (KLOR-CON M) CR tablet 40 mEq (40 mEq Oral Given 10/30/22 2204)    Mobility walks Low fall risk   Focused Assessments Neuro Assessment Handoff:  Swallow screen pass? Yes          Neuro Assessment:   Neuro Checks:      Last Documented NIHSS Modified Score:   Has TPA been given? No If patient is a Neuro Trauma and patient is going to OR before floor call report to [place] nurse: [phone] or [phone]   R Recommendations: See Admitting Provider Note  Report given to:   Additional Notes: pt is AAOx4. Pt is on RA. Pt is ambulatory.

## 2022-10-31 NOTE — Consult Note (Signed)
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 She mentioned she had 6 epidoses of cellulitis in the same spot and this last time about 6 weeks prior to this admission and this time with pus discharge  Denies active ivdu (6 months prior to admission last use)  No sign of sepsis   11/28 bcx ngtd  Elevated lft unclear chronicity -- assymptomatic. Should check hepatitis in setting prior ivdu. Hiv should also be screened  Plan: Send hepatitis/hiv screen for health maintenance purpose Continue empiric abx Await debridement and hardware removal tomorrow F/u bcx Discussed with primary team   I spent 75 minute reviewing data/chart, and coordinating care and >50% direct face to face time providing counseling/discussing diagnostics/treatment plan with patient    ------------------------------------------------ Principal Problem:   Cellulitis of left lower extremity Active Problems:   Hypokalemia   Transaminitis   Chronic anemia    HPI: Candice Jordan is a 21 y.o. female  hx ivdu, hx left tibial plateau fx s/p orif with I'm rod tibia 2015, admitted 11/28 with left lower soft tissue infection on exam with sinus tract/abscess and sign of peri-hardware lucency left LE  Patient had fx and orif 2015 and recovery course has been uncomplicated. Noticed 3 days ago redness/pain left leg. No f/c  No ivdu (last use 6 months ago)  Patient has had 6 episodes of cellulitis in the  left medial ankle area since beginning of 2023 and would get abx in urgent care here and there for short course at a time. Last one was 6 weeks ago but this time there is pus/sinus tract and the redness doesn't go away. She doesn't recall what abx she took I reviewed dispense report and I only see cipro only for 5 days rx'ed on 10/11/22  Came to [place] and was transferred here for ongoing management Afebrile here hds and no leukocytosis Bcx in progress Piptazo/vanc at [place] now ceftriaxone/vanc here  Ct showed sinus tract/abscess and perihardware lucency Ortho plans to I&D and remove hardware tomorrow  No other complaint    Family History  Problem Relation Age of Onset   Hepatitis C Sister     Social History   Tobacco Use   Smoking status: Some Days    Types: Cigarettes  Substance Use Topics   Alcohol use: Not Currently   Drug use: Not Currently    Comment: history of IV drug use (heroin); reports most recent use in May '23    No Known Allergies  Review of Systems: ROS All Other ROS was negative, except mentioned above   Past Medical History:  Diagnosis Date   Closed fracture of left tibial plateau    s/p ORIF in May '15 (Dr. Margarita Rana)   IVDU (intravenous drug user)        Scheduled Meds:  [START ON 11/01/2022] influenza vac split quadrivalent PF  0.5 mL Intramuscular Tomorrow-1000   Continuous Infusions:  cefTRIAXone (ROCEPHIN)  IV     vancomycin 750 mg (10/31/22 1054)   PRN Meds:.acetaminophen **OR** acetaminophen, fentaNYL (SUBLIMAZE) injection, melatonin, naLOXone (NARCAN)  injection, ondansetron (ZOFRAN) IV   OBJECTIVE: Blood pressure (!) 122/92, pulse (!) 107, temperature 98.4 F (36.9 C), temperature source Oral, resp. rate 20, height 5\' 5"  (1.651 m), weight 54.4 kg, SpO2 100 %.  Physical Exam  General/constitutional: no distress, pleasant HEENT: Normocephalic, PER, Conj Clear, EOMI, Oropharynx clear Neck supple CV: rrr no  mrg Lungs: clear to auscultation, normal respiratory effort Abd: Soft, Nontender Ext: no edema Skin/msk; warm/red lle around medial ankle associated with tenderness and serous discharge from a sinus tract Neuro: nonfocal   Lab Results Lab Results  Component Value Date   WBC 6.4 10/31/2022   HGB 10.6 (L) 10/31/2022   HCT 32.0 (L) 10/31/2022   MCV 89.1 10/31/2022   PLT 299 10/31/2022    Lab Results  Component Value Date   CREATININE 0.64 10/31/2022   BUN 7 10/31/2022   NA 134 (L) 10/31/2022   K 3.8 10/31/2022   CL 104 10/31/2022   CO2 25 10/31/2022    Lab Results  Component Value Date   ALT 79 (H) 10/31/2022   AST 90 (H) 10/31/2022   ALKPHOS 60 10/31/2022   BILITOT 0.5 10/31/2022      Microbiology: Recent Results (from the past 240 hour(s))  Blood Culture (routine x 2)     Status: None (Preliminary result)   Collection Time: 10/30/22  6:02 AM   Specimen: BLOOD  Result Value Ref Range Status   Specimen Description BLOOD LEFT ANTECUBITAL  Final   Special Requests   Final    BOTTLES DRAWN AEROBIC AND ANAEROBIC Blood Culture adequate volume   Culture   Final    NO GROWTH 1 DAY Performed at [facility], [address], [place], [place]    Report Status PENDING  Incomplete  Blood Culture (routine x 2)     Status: None (Preliminary result)   Collection Time: 10/30/22  6:06 AM   Specimen: BLOOD  Result Value Ref Range Status   Specimen Description BLOOD RIGHT ANTECUBITAL  Final   Special Requests   Final    BOTTLES DRAWN AEROBIC ONLY Blood Culture results may not be optimal due to an inadequate volume of blood received in culture bottles   Culture   Final    NO GROWTH 1 DAY Performed at [facility], [address], [place], [place]    Report Status PENDING  Incomplete     Serology:    Imaging: If present, new imagings (plain films, ct scans, and mri) have been personally visualized and interpreted; radiology reports have  been reviewed. Decision making incorporated into the Impression / Recommendations.  11/29 ct ankle left side with contrast 1. Healed mid-distal left tibial diaphyseal fracture transfixed with an intramedullary nail and interlocking screws. Lucency around the distal visualized portion of the intramedullary nail most focally severe at the tip of the intramedullary nail consistent with osteomyelitis. Well-marginated cortical defect along the posteromedial aspect of the distal tibial metaphysis which is contiguous with the most pronounced area of perihardware lucency. A 3.7 x 1.4 x 5.1 cm abscess along the posteromedial left lower leg superficial to the fascia with a sinus track extending from the posteromedial tibial cortical defect. Overall appearance is similar to the prior exam of 10/29/2022.   10/31/2022, MD [facility]  [phone] pager    10/31/2022, 12:17 PM

## 2022-10-31 NOTE — Progress Notes (Signed)
PROGRESS NOTE    Candice Jordan  ZDG:[id number] DOB: 2001/01/10 DOA: 10/30/2022 PCP: Patient, No Pcp Per    Brief Narrative:    Candice Jordan is a 21 y.o. female with medical history significant for IV drug abuse, chronic anemia with baseline hemoglobin  8-11, in addition to left tibial plateau fracture status post ORIF with IM rod fixation to the mid to distal left tibial shaft, who is admitted to [facility] on 10/30/2022 with left lower extremity cellulitis after presenting from home to [facility] ED complaining of left lower extremity redness.    Assessment and Plan:   Cellulitis/abscess of the left lower extremity:  -see CT scan -ID consult: IV abx -ortho consult: hardware removal and debridement needed   Hypokalemia:  -replete    Chronic transaminitis:  -Per chart review, it appears that the patient has chronically elevated AST/ALT dating back to April 2021 -outpatient follow up      Chronic normocytic anemia:  - baseline hemoglobin range 8-11, with presenting hemoglobin consistent with his baseline range, in the absence of any evidence of active blood loss. -monitor    hx IV drug abuse -denies recent -HIV/hepatitis pending     DVT prophylaxis: SCDs Start: 10/30/22 2127    Code Status: Full Code   Disposition Plan:  Level of care: Telemetry Medical Status is: Inpatient Remains inpatient appropriate because: needs surgery    Consultants:  ID ortho   Subjective: hungry  Objective: Vitals:   10/31/22 0030 10/31/22 0031 10/31/22 0100 10/31/22 0919  BP: (!) 132/95  126/88 (!) 122/92  Pulse: 96  84 (!) 107  Resp: 20  20 20   Temp:  98.7 F (37.1 C) 98.1 F (36.7 C) 98.4 F (36.9 C)  TempSrc:  Oral Oral Oral  SpO2: 100%  100% 100%  Weight:      Height:        Intake/Output Summary (Last 24 hours) at 10/31/2022 1304 Last data filed at 10/31/2022 0900 Gross per 24 hour  Intake 0 ml  Output --  Net 0 ml   Filed Weights   10/30/22 2100   Weight: 54.4 kg    Examination:   General: Appearance:    Thin female in no acute distress     Lungs:     respirations unlabored  Heart:    Tachycardic.    MS:   All extremities are intact.  Redness in ankle on LLE   Neurologic:   Awake, alert       Data Reviewed: I have personally reviewed following labs and imaging studies  CBC: Recent Labs  Lab 10/30/22 0602 10/31/22 0508  WBC 8.9 6.4  NEUTROABS 4.2 2.5  HGB 10.5* 10.6*  HCT 33.2* 32.0*  MCV 91.5 89.1  PLT 334 299   Basic Metabolic Panel: Recent Labs  Lab 10/30/22 0602 10/31/22 0508  NA 134* 134*  K 3.4* 3.8  CL 100 104  CO2 25 25  GLUCOSE 102* 122*  BUN 10 7  CREATININE 0.71 0.64  CALCIUM 8.9 8.8*  MG 1.9 1.7   GFR: Estimated Creatinine Clearance: 94.7 mL/min (by C-G formula based on SCr of 0.64 mg/dL). Liver Function Tests: Recent Labs  Lab 10/30/22 0602 10/31/22 0508  AST 102* 90*  ALT 79* 79*  ALKPHOS 70 60  BILITOT 0.3 0.5  PROT 8.1 7.6  ALBUMIN 3.1* 2.8*   No results for input(s): "LIPASE", "AMYLASE" in the last 168 hours. No results for input(s): "AMMONIA" in the last 168 hours. Coagulation Profile:  Recent Labs  Lab 10/31/22 0508  INR 1.1   Cardiac Enzymes: No results for input(s): "CKTOTAL", "CKMB", "CKMBINDEX", "TROPONINI" in the last 168 hours. BNP (last 3 results) No results for input(s): "PROBNP" in the last 8760 hours. HbA1C: No results for input(s): "HGBA1C" in the last 72 hours. CBG: No results for input(s): "GLUCAP" in the last 168 hours. Lipid Profile: No results for input(s): "CHOL", "HDL", "LDLCALC", "TRIG", "CHOLHDL", "LDLDIRECT" in the last 72 hours. Thyroid Function Tests: No results for input(s): "TSH", "T4TOTAL", "FREET4", "T3FREE", "THYROIDAB" in the last 72 hours. Anemia Panel: Recent Labs    10/30/22 0602  FERRITIN 79  TIBC 347  IRON 25*   Sepsis Labs: Recent Labs  Lab 10/30/22 0602 10/31/22 0437  LATICACIDVEN 0.7 0.9    Recent Results  (from the past 240 hour(s))  Blood Culture (routine x 2)     Status: None (Preliminary result)   Collection Time: 10/30/22  6:02 AM   Specimen: BLOOD  Result Value Ref Range Status   Specimen Description BLOOD LEFT ANTECUBITAL  Final   Special Requests   Final    BOTTLES DRAWN AEROBIC AND ANAEROBIC Blood Culture adequate volume   Culture   Final    NO GROWTH 1 DAY Performed at [facility], [address], [place], [place] [address]    Report Status PENDING  Incomplete  Blood Culture (routine x 2)     Status: None (Preliminary result)   Collection Time: 10/30/22  6:06 AM   Specimen: BLOOD  Result Value Ref Range Status   Specimen Description BLOOD RIGHT ANTECUBITAL  Final   Special Requests   Final    BOTTLES DRAWN AEROBIC ONLY Blood Culture results may not be optimal due to an inadequate volume of blood received in culture bottles   Culture   Final    NO GROWTH 1 DAY Performed at [facility], [address] [place], [place] [address]    Report Status PENDING  Incomplete         Radiology Studies: CT ANKLE LEFT W CONTRAST  Result Date: 10/31/2022 CLINICAL DATA:  Left lower extremity osteomyelitis. History of IM rod fixation. EXAM: CT OF THE LEFT ANKLE WITH CONTRAST TECHNIQUE: Multidetector CT imaging of the left ankle was performed following the standard protocol during bolus administration of intravenous contrast. RADIATION DOSE REDUCTION: This exam was performed according to the departmental dose-optimization program which includes automated exposure control, adjustment of the mA and/or kV according to patient size and/or use of iterative reconstruction technique. CONTRAST:  88mL OMNIPAQUE IOHEXOL 350 MG/ML SOLN COMPARISON:  10/29/2022 FINDINGS: Bones/Joint/Cartilage No acute fracture or dislocation. Healed mid-distal left tibial diaphyseal fracture transfixed with an intramedullary nail and interlocking screws. Lucency around the distal visualized portion of the  intramedullary nail most focally severe at the tip of the intramedullary nail. Well-marginated cortical defect along the posteromedial aspect of the distal tibial metaphysis which is contiguous with the most pronounced area of perihardware lucency. No joint effusion. Ligaments Ligaments are suboptimally evaluated by CT. Muscles and Tendons Flexor, extensor, peroneal and Achilles tendons are intact. Plantar fascia is intact. Visualized portions of the muscles of the left lower leg demonstrate no focal abnormality. Soft tissue 3.7 x 1.4 x 5.1 cm complex fluid collection along the posteromedial left lower leg superficial to the fascia with a sinus track extending from the posteromedial tibial cortical defect. No soft tissue mass. IMPRESSION: 1. Healed mid-distal left tibial diaphyseal fracture transfixed with an intramedullary nail and  interlocking screws. Lucency around the distal visualized portion of the intramedullary nail most focally severe at the tip of the intramedullary nail consistent with osteomyelitis. Well-marginated cortical defect along the posteromedial aspect of the distal tibial metaphysis which is contiguous with the most pronounced area of perihardware lucency. A 3.7 x 1.4 x 5.1 cm abscess along the posteromedial left lower leg superficial to the fascia with a sinus track extending from the posteromedial tibial cortical defect. Overall appearance is similar to the prior exam of 10/29/2022. Electronically Signed   By: Elige Ko M.D.   On: 10/31/2022 08:54   DG Ankle Complete Left  Result Date: 10/30/2022 CLINICAL DATA:  Ankle wound infection. EXAM: LEFT ANKLE COMPLETE - 3+ VIEW COMPARISON:  Study of 07/18/2021 FINDINGS: Again noted is a healed mid to distal tibial shaft fracture with intramedullary rod fixation. No recent fracture is seen. No findings of significant hardware loosening. There is no erosive bone lesion suspicious for acute osteomyelitis. The ankle mortise is symmetric.  Arthritic changes are not seen. There is generalized edema in the mid to lower foreleg extending over the ankle and hindfoot, similar to the previous study. No radiopaque soft tissue foreign body or soft tissue gas are seen. IMPRESSION: 1. Generalized edema in the mid to lower foreleg extending over the ankle and hindfoot, similar to the previous study. No radiographic evidence of acute osteomyelitis. 2. Healed mid to distal tibial shaft fracture with intramedullary rod fixation. Electronically Signed   By: Almira Bar M.D.   On: 10/30/2022 06:39        Scheduled Meds:  [START ON 11/01/2022] influenza vac split quadrivalent PF  0.5 mL Intramuscular Tomorrow-1000   Continuous Infusions:  cefTRIAXone (ROCEPHIN)  IV     vancomycin 750 mg (10/31/22 1054)     LOS: 1 day    Time spent: 45 minutes spent on chart review, discussion with nursing staff, consultants, updating family and interview/physical exam; more than 50% of that time was spent in counseling and/or coordination of care.    Joseph Art, DO [facility] Available via Epic secure chat 7am-7pm After these hours, please refer to coverage provider listed on [email or web address] 10/31/2022, 1:04 PM

## 2022-10-31 NOTE — Progress Notes (Signed)
Patient is alert and oriented.  She is sitting upright in the bed eating pizza and salad.  She is restless.   BP 117/75  HR 112  RR 20  O2 sat 100% on RA  Per RN there are orders for restricted visiting.   RN and security at bedside, visitor asked to leave.  He left with security guard.

## 2022-10-31 NOTE — Progress Notes (Signed)
Rapid Response nurse came to bedside and checked the patient. Security at bedside explained to patient about MD order Visitor Restriction, vape was found in her bed. Patient got upset and agitated and wants to leave AMA, refused to sigh papers, security aware and will make documentation. MD contacted again about pt's AMA. RN explained the medical risks about AMA, pt, cursing staff.

## 2022-10-31 NOTE — Progress Notes (Signed)
Telemetry called, PT ST HR 130's=160's, went to check pt, she is not in her room, Telemetry still showing ST. Pt, paged to comeback to her room. Pt came back to the unit wearing street clothes with a gentleman. MD paged, orders in.

## 2022-10-31 NOTE — TOC Initial Note (Addendum)
Transition of Care [facility]) - Initial/Assessment Note    Patient Details  Name: Candice Jordan MRN: [id number] Date of Birth: 2001/03/26  Transition of Care [facility]) CM/SW Contact:    Epifanio Lesches, RN Phone Number: 10/31/2022, 1:27 PM  Clinical Narrative:                     Transition of Care (TOC) CM/SW Contact:    Epifanio Lesches, RN Phone Number: 10/31/2022, 1:34 PM   Presents with Cellulitis/abscess of the left lower extremity.  ResideS with a friend in [place], [place]. PTA states independent with no DME usage. Pt's mailing address ( grandmother's home) noted in epic, [address] [place] [address].  Pt without PCP.  NCM shared [facility] Medicine and arranged new pt appointment and noted on AVS. Pt without transportation or RX med concerns.  Transition of Care Department [facility]) has reviewed patient and will continue to monitor patient advancement through interdisciplinary progression rounds.    Expected Discharge Plan:  (Resides with a friend)     Patient Goals and CMS Choice        Expected Discharge Plan and Services Expected Discharge Plan:  (Resides with a friend)                                              Prior Living Arrangements/Services                       Activities of Daily Living Home Assistive Devices/Equipment: None ADL Screening (condition at time of admission) Patient's cognitive ability adequate to safely complete daily activities?: Yes Is the patient deaf or have difficulty hearing?: No Does the patient have difficulty seeing, even when wearing glasses/contacts?: No Does the patient have difficulty concentrating, remembering, or making decisions?: No Patient able to express need for assistance with ADLs?: Yes Does the patient have difficulty dressing or bathing?: No Independently performs ADLs?: Yes (appropriate for developmental age) Does the patient have difficulty walking or climbing stairs?:  No Weakness of Legs: None Weakness of Arms/Hands: None  Permission Sought/Granted                  Emotional Assessment       Orientation: : Oriented to Self, Oriented to Place, Oriented to  Time, Oriented to Situation   Psych Involvement: No (comment)  Admission diagnosis:  Wound infection [T14.8XXA, L08.9] Cellulitis of left lower extremity [L03.116] Patient Active Problem List   Diagnosis Date Noted   Osteomyelitis of left tibia (HCC) 10/31/2022   Hardware complicating wound infection (HCC) 10/31/2022   Cellulitis of left lower extremity 10/30/2022   Hypokalemia 10/30/2022   Transaminitis 10/30/2022   Chronic anemia 10/30/2022   PCP:  Patient, No Pcp Per Pharmacy:   [id number] Rosalita Levan, [place] - [address] AT [facility] OF [address] [place] [address] Phone: [phone] Fax: [phone]     Social Determinants of Health (SDOH) Interventions    Readmission Risk Interventions     No data to display

## 2022-10-31 NOTE — TOC CAGE-AID Note (Signed)
Transition of Care [facility]) - CAGE-AID Screening   Patient Details  Name: Candice Jordan MRN: [id number] Date of Birth: Feb 18, 2001  Transition of Care [facility]) CM/SW Contact:    Epifanio Lesches, RN Phone Number: [phone] 10/31/2022, 1:16 PM   Clinical Narrative: Screening completed. Pt states has used drugs in the last 2 months. States doesn't need any help to stop usage. Open to receiving educational material. Information applied to AVS.   CAGE-AID Screening:    Have You Ever Felt You Ought to Cut Down on Your Drinking or Drug Use?: No Have People Annoyed You By Critizing Your Drinking Or Drug Use?: No Have You Felt Bad Or Guilty About Your Drinking Or Drug Use?: No Have You Ever Had a Drink or Used Drugs First Thing In The Morning to Steady Your Nerves or to Get Rid of a Hangover?: No CAGE-AID Score: 0  Substance Abuse Education Offered: Yes  Substance abuse interventions: Transport planner

## 2022-11-01 ENCOUNTER — Encounter (HOSPITAL_COMMUNITY): Payer: Self-pay | Admitting: Pulmonary Disease

## 2022-11-01 NOTE — Progress Notes (Signed)
Unable to contact pt for pre-op call. Her number is "not in service". The other number listed for her is her grandmother's number and she states she has not seen her in 3 weeks. Grandmother said that this is not unusual for the pt. I called and notified Dr. Eulah Pont that pt isn't available to be given pre-op instructions and probably is not aware when and where surgery is scheduled. He states that the surgery was scheduled and to be done while she was in the hospital.

## 2022-11-01 NOTE — Discharge Summary (Signed)
Physician Discharge Summary   Patient: Candice Jordan MRN: [id number] DOB: 2001/02/02  Admit date:     10/30/2022  Discharge date: 10/31/2022  Discharge Physician: Joseph Art   PCP: Patient, No Pcp Per   Recommendations at discharge:  Left AMA despite education about needing abx for her infection and the risk of limb loss  Discharge Diagnoses: Principal Problem:   Cellulitis of left lower extremity Active Problems:   Hypokalemia   Transaminitis   Chronic anemia   Osteomyelitis of left tibia (HCC)   Hardware complicating wound infection (HCC)  Left AMA despite being educated on risk of limb loss  Left distal tibia osteo -- needs I&D and hardware removal    Drug use in hospital -patient left floor without permission-- came back with visitor and was tachycardic--- uds shows amphetamines   DISCHARGE MEDICATION: Allergies as of 10/31/2022   No Known Allergies      Medication List    You have not been prescribed any medications.     Follow-up Information     [facility] Follow up on 11/12/2022.   Why: New pt appointment scheduled for 11/12/2022 at 9:30 am with Diona Foley NP Contact information: [address], [place], [place] [address] [phone]               Discharge Exam: Ceasar Mons Weights   10/30/22 2100  Weight: 54.4 kg     The results of significant diagnostics from this hospitalization (including imaging, microbiology, ancillary and laboratory) are listed below for reference.   Imaging Studies: CT ANKLE LEFT W CONTRAST  Result Date: 10/31/2022 CLINICAL DATA:  Left lower extremity osteomyelitis. History of IM rod fixation. EXAM: CT OF THE LEFT ANKLE WITH CONTRAST TECHNIQUE: Multidetector CT imaging of the left ankle was performed following the standard protocol during bolus administration of intravenous contrast. RADIATION DOSE REDUCTION: This exam was performed according to the departmental dose-optimization program which includes  automated exposure control, adjustment of the mA and/or kV according to patient size and/or use of iterative reconstruction technique. CONTRAST:  33mL OMNIPAQUE IOHEXOL 350 MG/ML SOLN COMPARISON:  10/29/2022 FINDINGS: Bones/Joint/Cartilage No acute fracture or dislocation. Healed mid-distal left tibial diaphyseal fracture transfixed with an intramedullary nail and interlocking screws. Lucency around the distal visualized portion of the intramedullary nail most focally severe at the tip of the intramedullary nail. Well-marginated cortical defect along the posteromedial aspect of the distal tibial metaphysis which is contiguous with the most pronounced area of perihardware lucency. No joint effusion. Ligaments Ligaments are suboptimally evaluated by CT. Muscles and Tendons Flexor, extensor, peroneal and Achilles tendons are intact. Plantar fascia is intact. Visualized portions of the muscles of the left lower leg demonstrate no focal abnormality. Soft tissue 3.7 x 1.4 x 5.1 cm complex fluid collection along the posteromedial left lower leg superficial to the fascia with a sinus track extending from the posteromedial tibial cortical defect. No soft tissue mass. IMPRESSION: 1. Healed mid-distal left tibial diaphyseal fracture transfixed with an intramedullary nail and interlocking screws. Lucency around the distal visualized portion of the intramedullary nail most focally severe at the tip of the intramedullary nail consistent with osteomyelitis. Well-marginated cortical defect along the posteromedial aspect of the distal tibial metaphysis which is contiguous with the most pronounced area of perihardware lucency. A 3.7 x 1.4 x 5.1 cm abscess along the posteromedial left lower leg superficial to the fascia with a sinus track extending from the posteromedial tibial cortical defect. Overall appearance is similar to the prior  exam of 10/29/2022. Electronically Signed   By: Elige Ko M.D.   On: 10/31/2022 08:54   DG  Ankle Complete Left  Result Date: 10/30/2022 CLINICAL DATA:  Ankle wound infection. EXAM: LEFT ANKLE COMPLETE - 3+ VIEW COMPARISON:  Study of 07/18/2021 FINDINGS: Again noted is a healed mid to distal tibial shaft fracture with intramedullary rod fixation. No recent fracture is seen. No findings of significant hardware loosening. There is no erosive bone lesion suspicious for acute osteomyelitis. The ankle mortise is symmetric. Arthritic changes are not seen. There is generalized edema in the mid to lower foreleg extending over the ankle and hindfoot, similar to the previous study. No radiopaque soft tissue foreign body or soft tissue gas are seen. IMPRESSION: 1. Generalized edema in the mid to lower foreleg extending over the ankle and hindfoot, similar to the previous study. No radiographic evidence of acute osteomyelitis. 2. Healed mid to distal tibial shaft fracture with intramedullary rod fixation. Electronically Signed   By: Almira Bar M.D.   On: 10/30/2022 06:39    Microbiology: Results for orders placed or performed during the hospital encounter of 10/30/22  Blood Culture (routine x 2)     Status: None (Preliminary result)   Collection Time: 10/30/22  6:02 AM   Specimen: BLOOD  Result Value Ref Range Status   Specimen Description BLOOD LEFT ANTECUBITAL  Final   Special Requests   Final    BOTTLES DRAWN AEROBIC AND ANAEROBIC Blood Culture adequate volume   Culture   Final    NO GROWTH 2 DAYS Performed at [facility], [address] [place], [place] [address]    Report Status PENDING  Incomplete  Blood Culture (routine x 2)     Status: None (Preliminary result)   Collection Time: 10/30/22  6:06 AM   Specimen: BLOOD  Result Value Ref Range Status   Specimen Description BLOOD RIGHT ANTECUBITAL  Final   Special Requests   Final    BOTTLES DRAWN AEROBIC ONLY Blood Culture results may not be optimal due to an inadequate volume of blood received in culture bottles   Culture    Final    NO GROWTH 2 DAYS Performed at [facility], [address], [place], [place] [address]    Report Status PENDING  Incomplete    Labs: CBC: Recent Labs  Lab 10/30/22 0602 10/31/22 0508  WBC 8.9 6.4  NEUTROABS 4.2 2.5  HGB 10.5* 10.6*  HCT 33.2* 32.0*  MCV 91.5 89.1  PLT 334 299   Basic Metabolic Panel: Recent Labs  Lab 10/30/22 0602 10/31/22 0508  NA 134* 134*  K 3.4* 3.8  CL 100 104  CO2 25 25  GLUCOSE 102* 122*  BUN 10 7  CREATININE 0.71 0.64  CALCIUM 8.9 8.8*  MG 1.9 1.7   Liver Function Tests: Recent Labs  Lab 10/30/22 0602 10/31/22 0508  AST 102* 90*  ALT 79* 79*  ALKPHOS 70 60  BILITOT 0.3 0.5  PROT 8.1 7.6  ALBUMIN 3.1* 2.8*   CBG: No results for input(s): "GLUCAP" in the last 168 hours.  Discharge time spent: less than 30 minutes.  Signed: Joseph Art, DO [facility] 11/01/2022

## 2022-11-02 ENCOUNTER — Ambulatory Visit (HOSPITAL_COMMUNITY): Admission: RE | Admit: 2022-11-02 | Payer: Medicaid Other | Source: Home / Self Care | Admitting: Orthopedic Surgery

## 2022-11-02 SURGERY — INSERTION, INTRAMEDULLARY ROD, TIBIA
Anesthesia: Choice | Laterality: Left

## 2022-11-04 LAB — CULTURE, BLOOD (ROUTINE X 2)
Culture: NO GROWTH
Culture: NO GROWTH
Special Requests: ADEQUATE
